# Patient Record
Sex: Female | Born: 1985 | Race: Black or African American | Hispanic: No | Marital: Single | Smoking: Never smoker
Health system: Southern US, Community
[De-identification: ages and names within clinical notes are randomized; demographics above are authoritative.]

## PROBLEM LIST (undated history)

## (undated) DIAGNOSIS — R569 Unspecified convulsions: Secondary | ICD-10-CM

## (undated) DIAGNOSIS — N83209 Unspecified ovarian cyst, unspecified side: Secondary | ICD-10-CM

## (undated) DIAGNOSIS — J45909 Unspecified asthma, uncomplicated: Secondary | ICD-10-CM

## (undated) DIAGNOSIS — G932 Benign intracranial hypertension: Secondary | ICD-10-CM

## (undated) DIAGNOSIS — G43909 Migraine, unspecified, not intractable, without status migrainosus: Secondary | ICD-10-CM

## (undated) DIAGNOSIS — F32A Depression, unspecified: Secondary | ICD-10-CM

## (undated) DIAGNOSIS — H538 Other visual disturbances: Secondary | ICD-10-CM

## (undated) DIAGNOSIS — R42 Dizziness and giddiness: Secondary | ICD-10-CM

## (undated) DIAGNOSIS — F329 Major depressive disorder, single episode, unspecified: Secondary | ICD-10-CM

## (undated) DIAGNOSIS — E669 Obesity, unspecified: Secondary | ICD-10-CM

## (undated) HISTORY — DX: Depression, unspecified: F32.A

## (undated) HISTORY — PX: ABDOMINAL HYSTERECTOMY: SHX81

## (undated) HISTORY — DX: Other visual disturbances: H53.8

## (undated) HISTORY — PX: OTHER SURGICAL HISTORY: SHX169

## (undated) HISTORY — DX: Migraine, unspecified, not intractable, without status migrainosus: G43.909

## (undated) HISTORY — DX: Major depressive disorder, single episode, unspecified: F32.9

---

## 2016-02-14 ENCOUNTER — Emergency Department (HOSPITAL_COMMUNITY)
Admission: EM | Admit: 2016-02-14 | Discharge: 2016-02-14 | Disposition: A | Discharge status: Home / Self Care | Payer: Medicare Other | Attending: Emergency Medicine | Admitting: Emergency Medicine

## 2016-02-14 ENCOUNTER — Encounter (HOSPITAL_COMMUNITY): Payer: Self-pay | Admitting: Emergency Medicine

## 2016-02-14 DIAGNOSIS — R110 Nausea: Secondary | ICD-10-CM | POA: Insufficient documentation

## 2016-02-14 DIAGNOSIS — J029 Acute pharyngitis, unspecified: Secondary | ICD-10-CM | POA: Diagnosis not present

## 2016-02-14 DIAGNOSIS — J45909 Unspecified asthma, uncomplicated: Secondary | ICD-10-CM | POA: Insufficient documentation

## 2016-02-14 DIAGNOSIS — R05 Cough: Secondary | ICD-10-CM | POA: Insufficient documentation

## 2016-02-14 DIAGNOSIS — R0982 Postnasal drip: Secondary | ICD-10-CM | POA: Insufficient documentation

## 2016-02-14 DIAGNOSIS — R300 Dysuria: Secondary | ICD-10-CM | POA: Insufficient documentation

## 2016-02-14 DIAGNOSIS — J9801 Acute bronchospasm: Secondary | ICD-10-CM

## 2016-02-14 HISTORY — DX: Unspecified asthma, uncomplicated: J45.909

## 2016-02-14 LAB — URINALYSIS, ROUTINE W REFLEX MICROSCOPIC
Bilirubin Urine: NEGATIVE
GLUCOSE, UA: NEGATIVE mg/dL
Hgb urine dipstick: NEGATIVE
Ketones, ur: NEGATIVE mg/dL
LEUKOCYTES UA: NEGATIVE
NITRITE: NEGATIVE
Protein, ur: NEGATIVE mg/dL
Specific Gravity, Urine: 1.031 — ABNORMAL HIGH (ref 1.005–1.030)
pH: 6 (ref 5.0–8.0)

## 2016-02-14 MED ORDER — LORATADINE 10 MG PO TABS: 10.0000 mg | ORAL_TABLET | Freq: Every day | ORAL | Status: DC

## 2016-02-14 MED ORDER — ALBUTEROL SULFATE HFA 108 (90 BASE) MCG/ACT IN AERS
2.0000 | INHALATION_SPRAY | Freq: Once | RESPIRATORY_TRACT | Status: AC
  Administered 2016-02-14: 2 via RESPIRATORY_TRACT
  Filled 2016-02-14: qty 6.7

## 2016-02-14 MED ORDER — ALBUTEROL SULFATE (2.5 MG/3ML) 0.083% IN NEBU
2.5000 mg | INHALATION_SOLUTION | Freq: Once | RESPIRATORY_TRACT | Status: AC
  Administered 2016-02-14: 2.5 mg via RESPIRATORY_TRACT
  Filled 2016-02-14: qty 3

## 2016-02-14 MED ORDER — PREDNISONE 20 MG PO TABS
60.0000 mg | ORAL_TABLET | Freq: Once | ORAL | Status: AC
  Administered 2016-02-14: 60 mg via ORAL
  Filled 2016-02-14: qty 3

## 2016-02-14 MED ORDER — PREDNISONE 20 MG PO TABS: 40.0000 mg | ORAL_TABLET | Freq: Every day | ORAL | Status: DC

## 2016-02-14 MED ORDER — BENZONATATE 100 MG PO CAPS: 100.0000 mg | ORAL_CAPSULE | Freq: Three times a day (TID) | ORAL | Status: DC | PRN

## 2016-02-14 MED ORDER — IPRATROPIUM-ALBUTEROL 0.5-2.5 (3) MG/3ML IN SOLN
3.0000 mL | Freq: Once | RESPIRATORY_TRACT | Status: AC
  Administered 2016-02-14: 3 mL via RESPIRATORY_TRACT
  Filled 2016-02-14: qty 3

## 2016-02-14 NOTE — ED Notes (Signed)
Pt c/o sore throat, cough x 3 days with dysuria.

## 2016-02-14 NOTE — Discharge Instructions (Signed)
Bronchospasm, Adult A bronchospasm is a spasm or tightening of the airways going into the lungs. During a bronchospasm breathing becomes more difficult because the airways get smaller. When this happens there can be coughing, a whistling sound when breathing (wheezing), and difficulty breathing. Bronchospasm is often associated with asthma, but not all patients who experience a bronchospasm have asthma. CAUSES  A bronchospasm is caused by inflammation or irritation of the airways. The inflammation or irritation may be triggered by:   Allergies (such as to animals, pollen, food, or mold). Allergens that cause bronchospasm may cause wheezing immediately after exposure or many hours later.   Infection. Viral infections are believed to be the most common cause of bronchospasm.   Exercise.   Irritants (such as pollution, cigarette smoke, strong odors, aerosol sprays, and paint fumes).   Weather changes. Winds increase molds and pollens in the air. Rain refreshes the air by washing irritants out. Cold air may cause inflammation.   Stress and emotional upset.  SIGNS AND SYMPTOMS   Wheezing.   Excessive nighttime coughing.   Frequent or severe coughing with a simple cold.   Chest tightness.   Shortness of breath.  DIAGNOSIS  Bronchospasm is usually diagnosed through a history and physical exam. Tests, such as chest X-rays, are sometimes done to look for other conditions. TREATMENT   Inhaled medicines can be given to open up your airways and help you breathe. The medicines can be given using either an inhaler or a nebulizer machine.  Corticosteroid medicines may be given for severe bronchospasm, usually when it is associated with asthma. HOME CARE INSTRUCTIONS   Always have a plan prepared for seeking medical care. Know when to call your health care provider and local emergency services (911 in the [place] Know where you can access local emergency care.  Only take medicines as  directed by your health care provider.  If you were prescribed an inhaler or nebulizer machine, ask your health care provider to explain how to use it correctly. Always use a spacer with your inhaler if you were given one.  It is necessary to remain calm during an attack. Try to relax and breathe more slowly.  Control your home environment in the following ways:   Change your heating and air conditioning filter at least once a month.   Limit your use of fireplaces and wood stoves.  Do not smoke and do not allow smoking in your home.   Avoid exposure to perfumes and fragrances.   Get rid of pests (such as roaches and mice) and their droppings.   Throw away plants if you see mold on them.   Keep your house clean and dust free.   Replace carpet with wood, tile, or vinyl flooring. Carpet can trap dander and dust.   Use allergy-proof pillows, mattress covers, and box spring covers.   Wash bed sheets and blankets every week in hot water and dry them in a dryer.   Use blankets that are made of polyester or cotton.   Wash hands frequently. SEEK MEDICAL CARE IF:   You have muscle aches.   You have chest pain.   The sputum changes from clear or white to yellow, green, gray, or bloody.   The sputum you cough up gets thicker.   There are problems that may be related to the medicine you are given, such as a rash, itching, swelling, or trouble breathing.  SEEK IMMEDIATE MEDICAL CARE IF:   You have worsening wheezing and coughing  even after taking your prescribed medicines.   °· You have increased difficulty breathing.   °· You develop severe chest pain. °MAKE SURE YOU:  °· Understand these instructions. °· Will watch your condition. °· Will get help right away if you are not doing well or get worse. °  °This information is not intended to replace advice given to you by your health care provider. Make sure you discuss any questions you have with your health care  provider. °  °Document Released: 11/15/2003 Document Revised: 12/03/2014 Document Reviewed: 05/04/2013 °Elsevier Interactive Patient Education ©2016 Elsevier Inc. ° °Cough, Adult °Coughing is a reflex that clears your throat and your airways. Coughing helps to heal and protect your lungs. It is normal to cough occasionally, but a cough that happens with other symptoms or lasts a long time may be a sign of a condition that needs treatment. A cough may last only 2-3 weeks (acute), or it may last longer than 8 weeks (chronic). °CAUSES °Coughing is commonly caused by: °· Breathing in substances that irritate your lungs. °· A viral or bacterial respiratory infection. °· Allergies. °· Asthma. °· Postnasal drip. °· Smoking. °· Acid backing up from the stomach into the esophagus (gastroesophageal reflux). °· Certain medicines. °· Chronic lung problems, including COPD (or rarely, lung cancer). °· Other medical conditions such as heart failure. °HOME CARE INSTRUCTIONS  °Pay attention to any changes in your symptoms. Take these actions to help with your discomfort: °· Take medicines only as told by your health care provider. °¨ If you were prescribed an antibiotic medicine, take it as told by your health care provider. Do not stop taking the antibiotic even if you start to feel better. °¨ Talk with your health care provider before you take a cough suppressant medicine. °· Drink enough fluid to keep your urine clear or pale yellow. °· If the air is dry, use a cold steam vaporizer or humidifier in your bedroom or your home to help loosen secretions. °· Avoid anything that causes you to cough at work or at home. °· If your cough is worse at night, try sleeping in a semi-upright position. °· Avoid cigarette smoke. If you smoke, quit smoking. If you need help quitting, ask your health care provider. °· Avoid caffeine. °· Avoid alcohol. °· Rest as needed. °SEEK MEDICAL CARE IF:  °· You have new symptoms. °· You cough up pus. °· Your  cough does not get better after 2-3 weeks, or your cough gets worse. °· You cannot control your cough with suppressant medicines and you are losing sleep. °· You develop pain that is getting worse or pain that is not controlled with pain medicines. °· You have a fever. °· You have unexplained weight loss. °· You have night sweats. °SEEK IMMEDIATE MEDICAL CARE IF: °· You cough up blood. °· You have difficulty breathing. °· Your heartbeat is very fast. °  °This information is not intended to replace advice given to you by your health care provider. Make sure you discuss any questions you have with your health care provider. °  °Document Released: 05/11/2011 Document Revised: 08/03/2015 Document Reviewed: 01/19/2015 °Elsevier Interactive Patient Education ©2016 Elsevier Inc. ° °
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
## 2016-04-18 ENCOUNTER — Encounter (HOSPITAL_COMMUNITY): Payer: Self-pay | Admitting: Emergency Medicine

## 2016-04-18 ENCOUNTER — Emergency Department (HOSPITAL_COMMUNITY)
Admission: EM | Admit: 2016-04-18 | Discharge: 2016-04-18 | Disposition: A | Discharge status: Home / Self Care | Payer: Medicare Other | Attending: Emergency Medicine | Admitting: Emergency Medicine

## 2016-04-18 ENCOUNTER — Emergency Department (HOSPITAL_COMMUNITY): Payer: Medicare Other

## 2016-04-18 DIAGNOSIS — Z79899 Other long term (current) drug therapy: Secondary | ICD-10-CM | POA: Diagnosis not present

## 2016-04-18 DIAGNOSIS — R102 Pelvic and perineal pain: Secondary | ICD-10-CM

## 2016-04-18 DIAGNOSIS — N83202 Unspecified ovarian cyst, left side: Secondary | ICD-10-CM | POA: Diagnosis not present

## 2016-04-18 DIAGNOSIS — N760 Acute vaginitis: Secondary | ICD-10-CM | POA: Diagnosis not present

## 2016-04-18 DIAGNOSIS — R1032 Left lower quadrant pain: Secondary | ICD-10-CM | POA: Diagnosis present

## 2016-04-18 DIAGNOSIS — Z792 Long term (current) use of antibiotics: Secondary | ICD-10-CM | POA: Insufficient documentation

## 2016-04-18 DIAGNOSIS — B9689 Other specified bacterial agents as the cause of diseases classified elsewhere: Secondary | ICD-10-CM | POA: Diagnosis not present

## 2016-04-18 DIAGNOSIS — J45909 Unspecified asthma, uncomplicated: Secondary | ICD-10-CM | POA: Diagnosis not present

## 2016-04-18 DIAGNOSIS — N83201 Unspecified ovarian cyst, right side: Secondary | ICD-10-CM | POA: Diagnosis not present

## 2016-04-18 DIAGNOSIS — R109 Unspecified abdominal pain: Secondary | ICD-10-CM

## 2016-04-18 HISTORY — DX: Benign intracranial hypertension: G93.2

## 2016-04-18 LAB — URINALYSIS, ROUTINE W REFLEX MICROSCOPIC
Glucose, UA: NEGATIVE mg/dL
Hgb urine dipstick: NEGATIVE
Ketones, ur: NEGATIVE mg/dL
Leukocytes, UA: NEGATIVE
Nitrite: NEGATIVE
Protein, ur: NEGATIVE mg/dL
Specific Gravity, Urine: 1.031 — ABNORMAL HIGH (ref 1.005–1.030)
pH: 5.5 (ref 5.0–8.0)

## 2016-04-18 LAB — WET PREP, GENITAL
SPERM: NONE SEEN
Trich, Wet Prep: NONE SEEN
WBC WET PREP: NONE SEEN
YEAST WET PREP: NONE SEEN

## 2016-04-18 LAB — CBC WITH DIFFERENTIAL/PLATELET
Basophils Absolute: 0 10*3/uL (ref 0.0–0.1)
Basophils Relative: 0 %
Eosinophils Absolute: 0.1 10*3/uL (ref 0.0–0.7)
Eosinophils Relative: 1 %
HCT: 38.8 % (ref 36.0–46.0)
Hemoglobin: 12.7 g/dL (ref 12.0–15.0)
Lymphocytes Relative: 22 %
Lymphs Abs: 1.8 10*3/uL (ref 0.7–4.0)
MCH: 28 pg (ref 26.0–34.0)
MCHC: 32.7 g/dL (ref 30.0–36.0)
MCV: 85.7 fL (ref 78.0–100.0)
Monocytes Absolute: 0.3 10*3/uL (ref 0.1–1.0)
Monocytes Relative: 4 %
Neutro Abs: 5.8 10*3/uL (ref 1.7–7.7)
Neutrophils Relative %: 73 %
Platelets: 368 10*3/uL (ref 150–400)
RBC: 4.53 MIL/uL (ref 3.87–5.11)
RDW: 14 % (ref 11.5–15.5)
WBC: 8.1 10*3/uL (ref 4.0–10.5)

## 2016-04-18 LAB — BASIC METABOLIC PANEL
Anion gap: 8 (ref 5–15)
BUN: 11 mg/dL (ref 6–20)
CO2: 23 mmol/L (ref 22–32)
Calcium: 8.9 mg/dL (ref 8.9–10.3)
Chloride: 108 mmol/L (ref 101–111)
Creatinine, Ser: 0.85 mg/dL (ref 0.44–1.00)
GFR calc Af Amer: >60 mL/min (ref >60)
GFR calc non Af Amer: >60 mL/min (ref >60)
Glucose, Bld: 93 mg/dL (ref 65–99)
Potassium: 3.9 mmol/L (ref 3.5–5.1)
Sodium: 139 mmol/L (ref 135–145)

## 2016-04-18 MED ORDER — ONDANSETRON HCL 4 MG/2ML IJ SOLN
4.0000 mg | Freq: Once | INTRAMUSCULAR | Status: AC
  Administered 2016-04-18: 4 mg via INTRAVENOUS
  Filled 2016-04-18: qty 2

## 2016-04-18 MED ORDER — METRONIDAZOLE 500 MG PO TABS: 500.0000 mg | ORAL_TABLET | Freq: Two times a day (BID) | ORAL | Status: DC

## 2016-04-18 MED ORDER — MORPHINE SULFATE (PF) 4 MG/ML IV SOLN
4.0000 mg | Freq: Once | INTRAVENOUS | Status: AC
  Administered 2016-04-18: 4 mg via INTRAVENOUS
  Filled 2016-04-18: qty 1

## 2016-04-18 MED ORDER — NAPROXEN 500 MG PO TABS: 500.0000 mg | ORAL_TABLET | Freq: Two times a day (BID) | ORAL | Status: DC

## 2016-04-18 MED ORDER — FLUCONAZOLE 150 MG PO TABS: 150.0000 mg | ORAL_TABLET | Freq: Every day | ORAL | Status: DC

## 2016-04-18 NOTE — Discharge Instructions (Signed)
The [facility] women outpatient clinic will call you to follow up with them regarding your visit to the emergency department today. If you do not hear from them within 1 week call them to schedule an appointment.  Return to the emergency department if you experience worsening abdominal pain, nausea, vomiting, fever, chills.  Bacterial Vaginosis Bacterial vaginosis is a vaginal infection that occurs when the normal balance of bacteria in the vagina is disrupted. It results from an overgrowth of certain bacteria. This is the most common vaginal infection in women of childbearing age. Treatment is important to prevent complications, especially in pregnant women, as it can cause a premature delivery. CAUSES  Bacterial vaginosis is caused by an increase in harmful bacteria that are normally present in smaller amounts in the vagina. Several different kinds of bacteria can cause bacterial vaginosis. However, the reason that the condition develops is not fully understood. RISK FACTORS Certain activities or behaviors can put you at an increased risk of developing bacterial vaginosis, including:  Having a new sex partner or multiple sex partners.  Douching.  Using an intrauterine device (IUD) for contraception. Women do not get bacterial vaginosis from toilet seats, bedding, swimming pools, or contact with objects around them. SIGNS AND SYMPTOMS  Some women with bacterial vaginosis have no signs or symptoms. Common symptoms include:  Grey vaginal discharge.  A fishlike odor with discharge, especially after sexual intercourse.  Itching or burning of the vagina and vulva.  Burning or pain with urination. DIAGNOSIS  Your health care provider will take a medical history and examine the vagina for signs of bacterial vaginosis. A sample of vaginal fluid may be taken. Your health care provider will look at this sample under a microscope to check for bacteria and abnormal cells. A vaginal pH test may also  be done.  TREATMENT  Bacterial vaginosis may be treated with antibiotic medicines. These may be given in the form of a pill or a vaginal cream. A second round of antibiotics may be prescribed if the condition comes back after treatment. Because bacterial vaginosis increases your risk for sexually transmitted diseases, getting treated can help reduce your risk for chlamydia, gonorrhea, HIV, and herpes. HOME CARE INSTRUCTIONS   Only take over-the-counter or prescription medicines as directed by your health care provider.  If antibiotic medicine was prescribed, take it as directed. Make sure you finish it even if you start to feel better.  Tell all sexual partners that you have a vaginal infection. They should see their health care provider and be treated if they have problems, such as a mild rash or itching.  During treatment, it is important that you follow these instructions:  Avoid sexual activity or use condoms correctly.  Do not douche.  Avoid alcohol as directed by your health care provider.  Avoid breastfeeding as directed by your health care provider. SEEK MEDICAL CARE IF:   Your symptoms are not improving after 3 days of treatment.  You have increased discharge or pain.  You have a fever. MAKE SURE YOU:   Understand these instructions.  Will watch your condition.  Will get help right away if you are not doing well or get worse. FOR MORE INFORMATION  Centers for Disease Control and Prevention, Division of STD Prevention: [employer] American Sexual Health Association (ASHA): www.ashastd.org    This information is not intended to replace advice given to you by your health care provider. Make sure you discuss any questions you have with your health care provider.  Document Released: 11/12/2005 Document Revised: 12/03/2014 Document Reviewed: 06/24/2013 Elsevier Interactive Patient Education [employer]2016 Elsevier Inc.

## 2016-04-18 NOTE — ED Notes (Signed)
Pt wheeled to waiting area after discharge.

## 2016-04-18 NOTE — ED Notes (Signed)
Patient ambulatory to restroom  ?

## 2016-04-18 NOTE — ED Notes (Signed)
ED PA at bedside

## 2016-04-18 NOTE — ED Notes (Signed)
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

## 2016-04-18 NOTE — ED Notes (Addendum)
Writer had two unsuccessful attempt for blood draws. RN made aware.

## 2016-04-18 NOTE — ED Notes (Signed)
PA at bedside.

## 2016-04-18 NOTE — ED Notes (Signed)
Attempted blood draw, This RN unsuccessful.  Will have another RN attempt.

## 2016-04-18 NOTE — ED Notes (Signed)
Patient transported to Ultrasound 

## 2016-04-18 NOTE — ED Notes (Signed)
In ultrasound

## 2016-04-18 NOTE — ED Provider Notes (Signed)
CSN: [id number]     Arrival date & time 04/18/16  0435 History   First MD Initiated Contact with Patient 04/18/16 [phone]     Chief Complaint  Patient presents with  . Flank Pain     (Consider location/radiation/quality/duration/timing/severity/associated sxs/prior Treatment) HPI   Patient is a 30 year old female with history of asthma and pseudotumor cerebri who presents the ED with 3 days of burning urination with associated flank pain. Patient states she's had recurrent BV on and off for 4 months. Patient states one week ago she had a yeast infection and treated it with over-the-counter medication. She states the flank pain as bilateral, constant, achy, worse with movement. She isn't taking anything for the pain. She also has left lower quadrant pain when she urinates. Associated fatigue, nausea, mild dull achy headache, and anorexia. She denies fever, chills, vomiting, diarrhea, constipation, chest pain, shortness of breath, no hematochezia and no changes in bowel habits or form. Patient states she had HIV and syphilis testing just a few days prior.  Past Medical History  Diagnosis Date  . Asthma   . Pseudotumor cerebri    Past Surgical History  Procedure Laterality Date  . Abdominal hysterectomy    . Cesarean section    . Carpel  tunnel release     Family History  Problem Relation Age of Onset  . Hypertension Other   . Diabetes Other   . Cancer Other   . CAD Other   . Leukemia Other    Social History  Substance Use Topics  . Smoking status: Never Smoker   . Smokeless tobacco: None  . Alcohol Use: No   OB History    No data available     Review of Systems  Constitutional: Positive for appetite change and fatigue. Negative for fever and chills.  HENT: Negative for trouble swallowing.   Eyes: Negative for visual disturbance.  Respiratory: Negative for chest tightness and shortness of breath.   Cardiovascular: Negative for chest pain.  Gastrointestinal: Positive for  nausea and abdominal pain. Negative for vomiting, diarrhea, constipation and blood in stool.  Genitourinary: Positive for dysuria. Negative for hematuria, vaginal bleeding and vaginal discharge.  Musculoskeletal: Positive for back pain. Negative for myalgias, arthralgias and neck pain.  Skin: Negative for rash.  Neurological: Positive for light-headedness and headaches. Negative for syncope, weakness and numbness.  Psychiatric/Behavioral: Negative for confusion.      Allergies  Review of patient's allergies indicates no known allergies.  Home Medications   Prior to Admission medications   Medication Sig Start Date End Date Taking? Authorizing Provider  albuterol (PROVENTIL HFA;VENTOLIN HFA) 108 (90 Base) MCG/ACT inhaler Inhale 2 puffs into the lungs every 6 (six) hours as needed for wheezing or shortness of breath.   Yes Historical Provider, MD  benzonatate (TESSALON) 100 MG capsule Take 1 capsule (100 mg total) by mouth 3 (three) times daily as needed for cough. Patient not taking: Reported on 04/18/2016 02/14/16   Antony Madura, PA-C  fluconazole (DIFLUCAN) 150 MG tablet Take 1 tablet (150 mg total) by mouth daily. 04/18/16   Jerre Simon, PA  loratadine (CLARITIN) 10 MG tablet Take 1 tablet (10 mg total) by mouth daily. Patient not taking: Reported on 04/18/2016 02/14/16   Antony Madura, PA-C  metroNIDAZOLE (FLAGYL) 500 MG tablet Take 1 tablet (500 mg total) by mouth 2 (two) times daily. 04/18/16   Jerre Simon, PA  naproxen (NAPROSYN) 500 MG tablet Take 1 tablet (500 mg total) by mouth 2 (  two) times daily. 04/18/16   Jerre SimonJessica L Klaryssa Fauth, PA  predniSONE (DELTASONE) 20 MG tablet Take 2 tablets (40 mg total) by mouth daily. Patient not taking: Reported on 04/18/2016 02/14/16   Antony MaduraKelly Humes, PA-C   BP 138/86 mmHg  Pulse 77  Temp(Src) 98 F (36.7 C) (Oral)  Resp 17  Ht 5\' 6"  (1.676 m)  Wt 141.341 kg  BMI 50.32 kg/m2  SpO2 99% Physical Exam  Constitutional: She appears well-developed and  well-nourished. No distress.  HENT:  Head: Normocephalic and atraumatic.  Eyes: Conjunctivae are normal.  Neck: Normal range of motion.  Cardiovascular: Normal rate, regular rhythm and normal heart sounds.   Pulmonary/Chest: Effort normal and breath sounds normal. No respiratory distress. She has no wheezes. She has no rales.  Abdominal: Soft. Normal appearance and bowel sounds are normal. She exhibits no distension.  Mild TTP to the LUQ and LLQ. CVA tenderness left > right. No rebound tenderness, no guarding.  Genitourinary:  Exam performed by Jerre SimonJessica L Janari Yamada,  exam chaperoned Date: 04/18/2016 Pelvic exam: normal external genitalia without evidence of trauma. VULVA: normal appearing vulva with no masses, tenderness or lesion. VAGINA: normal appearing vagina with normal color and discharge, no lesions. CERVIX:not visualized, cervical motion tenderness absent; vaginal discharge - creamy, moderate. Wet prep and DNA probe for chlamydia and GC obtained.   ADNEXA: non-palpable due to body habitus. Pt TTP of the left adnexal region  UTERUS: not present, hx of hysterectomy.    Neurological: She is alert. Coordination normal.  Skin: Skin is warm and dry. No rash noted. She is not diaphoretic.  Psychiatric: She has a normal mood and affect. Her behavior is normal.    ED Course  Procedures (including critical care time) Labs Review Labs Reviewed  WET PREP, GENITAL - Abnormal; Notable for the following:    Clue Cells Wet Prep HPF POC PRESENT (*)    All other components within normal limits  URINALYSIS, ROUTINE W REFLEX MICROSCOPIC (NOT AT [facility]) - Abnormal; Notable for the following:    APPearance CLOUDY (*)    Specific Gravity, Urine 1.031 (*)    Bilirubin Urine SMALL (*)    All other components within normal limits  URINE CULTURE  CBC WITH DIFFERENTIAL/PLATELET  BASIC METABOLIC PANEL  GC/CHLAMYDIA PROBE AMP ([place]) NOT AT [facility]    Imaging Review [place] Transvaginal  Non-ob  04/18/2016  ADDENDUM REPORT: 04/18/2016 09:54 ADDENDUM: Correction, the recommended follow-up imaging and impression #2 should read: "Pelvis MRI without and with IV contrast (Gyn pelvis protocol)." Study discussed by telephone with PA Anabia Weatherwax on 04/18/2016 at 0950 hours. Electronically Signed   By: Odessa FlemingH  Hall M.D.   On: 04/18/2016 09:54  04/18/2016  CLINICAL DATA:  30 year old female with left greater than right pelvic pain for 3 days. Initial encounter. Personal history of hysterectomy. EXAM: TRANSABDOMINAL AND TRANSVAGINAL ULTRASOUND OF PELVIS TECHNIQUE: Both transabdominal and transvaginal ultrasound examinations of the pelvis were performed. Transabdominal technique was performed for global imaging of the pelvis including uterus, ovaries, adnexal regions, and pelvic cul-de-sac. It was necessary to proceed with endovaginal exam following the transabdominal exam to visualize the ovaries. COMPARISON:  None FINDINGS: Uterus Measurements: Surgically absent. Endometrium Thickness: Surgically absent. Right ovary Measurements: 10.8 x 7.6 x 7.8 cm. Large bilobed/septated versus adjacent adnexal cysts encompassing 5.1-7.6 cm individually. Intervening parenchyma or septation with vascularity (image 60). No other internal architecture. Left ovary Measurements: 3.4 x 3.4 x 2.5 cm. There is a 4.1 cm hypoechoic area with homogeneous low  level internal echoes (image 92). No definite associated vascularity. Other findings Trace simple appearing free fluid. IMPRESSION: 1. Surgically absent uterus. 2. Abnormalities of both ovaries. Overall followup pelvis ultrasound without and with IV contrast (gyn pelvis protocol) to characterize further may be most valuable at this point. On the right cystic lesions individually up to 7.6 cm with indeterminate but probably benign characteristics are noted. While on the left there is a 4.1 cm hypoechoic area which most resembles an endometrioma. 3. Trace simple appearing pelvic free  fluid. Electronically Signed: By: Odessa Fleming M.D. On: 04/18/2016 09:25   US Pelvis Complete  04/18/2016  ADDENDUM REPORT: 04/18/2016 09:54 ADDENDUM: Correction, the recommended follow-up imaging and impression #2 should read: "Pelvis MRI without and with IV contrast (Gyn pelvis protocol)." Study discussed by telephone with PA Walta [place] on 04/18/2016 at 0950 hours. Electronically Signed   By: Odessa Fleming M.D.   On: 04/18/2016 09:54  04/18/2016  CLINICAL DATA:  30 year old female with left greater than right pelvic pain for 3 days. Initial encounter. Personal history of hysterectomy. EXAM: TRANSABDOMINAL AND TRANSVAGINAL ULTRASOUND OF PELVIS TECHNIQUE: Both transabdominal and transvaginal ultrasound examinations of the pelvis were performed. Transabdominal technique was performed for global imaging of the pelvis including uterus, ovaries, adnexal regions, and pelvic cul-de-sac. It was necessary to proceed with endovaginal exam following the transabdominal exam to visualize the ovaries. COMPARISON:  None FINDINGS: Uterus Measurements: Surgically absent. Endometrium Thickness: Surgically absent. Right ovary Measurements: 10.8 x 7.6 x 7.8 cm. Large bilobed/septated versus adjacent adnexal cysts encompassing 5.1-7.6 cm individually. Intervening parenchyma or septation with vascularity (image 60). No other internal architecture. Left ovary Measurements: 3.4 x 3.4 x 2.5 cm. There is a 4.1 cm hypoechoic area with homogeneous low level internal echoes (image 92). No definite associated vascularity. Other findings Trace simple appearing free fluid. IMPRESSION: 1. Surgically absent uterus. 2. Abnormalities of both ovaries. Overall followup pelvis ultrasound without and with IV contrast (gyn pelvis protocol) to characterize further may be most valuable at this point. On the right cystic lesions individually up to 7.6 cm with indeterminate but probably benign characteristics are noted. While on the left there is a 4.1 cm  hypoechoic area which most resembles an endometrioma. 3. Trace simple appearing pelvic free fluid. Electronically Signed: By: Odessa Fleming M.D. On: 04/18/2016 09:25   I have personally reviewed and evaluated these images and lab results as part of my medical decision-making.   EKG Interpretation None      MDM   Final diagnoses:  Abdominal pain, unspecified abdominal location  BV (bacterial vaginosis)  Bilateral ovarian cysts    Patient with abdominal pain and bilateral CVA tenderness. Patient's urinalysis and labs were unremarkable. Wet prep revealed clue cells. Pelvic exam revealed left-sided adnexal tenderness. Due to tenderness ordered ultrasound.   Ultrasound revealed cystic lesion on the right ovary 7.6 centimeters and a 4.1 cm hypoechoic area on the left ovary. No torsion seen on ultrasound. The radiologist suggested MRI to further evaluate these lesions. I consulted OB/GYN and spoke with Dr. Debroah Loop who suggested the patient follow-up at the [facility] health clinic and there was no need for emergent imaging at this time. He states the clinic will call the patient to set up an appointment for follow-up. Patient moved to the area sometime ago and does not have an OB/GYN here.  Patient's pain was well-controlled in the ED. Will discharge patient with Flagyl for BV and naproxen for her abdominal pain. Discussed strict return precautions with  the patient and to call the clinic if she does not hear from them within a week. She expressed understanding to the discharge instructions.      Jerre Simon, PA 04/18/16 1150  Shon Baton, MD 04/19/16 1535

## 2016-04-19 ENCOUNTER — Encounter (HOSPITAL_COMMUNITY): Payer: Self-pay

## 2016-04-19 ENCOUNTER — Emergency Department (HOSPITAL_COMMUNITY)
Admission: EM | Admit: 2016-04-19 | Discharge: 2016-04-19 | Disposition: A | Discharge status: Home / Self Care | Payer: Medicare Other | Attending: Emergency Medicine | Admitting: Emergency Medicine

## 2016-04-19 ENCOUNTER — Emergency Department (HOSPITAL_COMMUNITY): Payer: Medicare Other

## 2016-04-19 DIAGNOSIS — Z79899 Other long term (current) drug therapy: Secondary | ICD-10-CM | POA: Diagnosis not present

## 2016-04-19 DIAGNOSIS — N83202 Unspecified ovarian cyst, left side: Secondary | ICD-10-CM | POA: Insufficient documentation

## 2016-04-19 DIAGNOSIS — J45909 Unspecified asthma, uncomplicated: Secondary | ICD-10-CM | POA: Diagnosis not present

## 2016-04-19 DIAGNOSIS — N83201 Unspecified ovarian cyst, right side: Secondary | ICD-10-CM | POA: Insufficient documentation

## 2016-04-19 DIAGNOSIS — R1030 Lower abdominal pain, unspecified: Secondary | ICD-10-CM | POA: Diagnosis present

## 2016-04-19 DIAGNOSIS — R109 Unspecified abdominal pain: Secondary | ICD-10-CM

## 2016-04-19 LAB — COMPREHENSIVE METABOLIC PANEL
ALT: 13 U/L — ABNORMAL LOW (ref 14–54)
AST: 14 U/L — ABNORMAL LOW (ref 15–41)
Albumin: 4.4 g/dL (ref 3.5–5.0)
Alkaline Phosphatase: 69 U/L (ref 38–126)
Anion gap: 5 (ref 5–15)
BUN: 11 mg/dL (ref 6–20)
CALCIUM: 8.7 mg/dL — AB (ref 8.9–10.3)
CHLORIDE: 107 mmol/L (ref 101–111)
CO2: 27 mmol/L (ref 22–32)
CREATININE: 0.76 mg/dL (ref 0.44–1.00)
Glucose, Bld: 88 mg/dL (ref 65–99)
POTASSIUM: 4.1 mmol/L (ref 3.5–5.1)
Sodium: 139 mmol/L (ref 135–145)
TOTAL PROTEIN: 8.1 g/dL (ref 6.5–8.1)
Total Bilirubin: 0.4 mg/dL (ref 0.3–1.2)

## 2016-04-19 LAB — URINE CULTURE

## 2016-04-19 LAB — URINALYSIS, ROUTINE W REFLEX MICROSCOPIC
GLUCOSE, UA: NEGATIVE mg/dL
HGB URINE DIPSTICK: NEGATIVE
Ketones, ur: NEGATIVE mg/dL
Leukocytes, UA: NEGATIVE
Nitrite: NEGATIVE
PROTEIN: NEGATIVE mg/dL
Specific Gravity, Urine: 1.031 — ABNORMAL HIGH (ref 1.005–1.030)
pH: 6 (ref 5.0–8.0)

## 2016-04-19 LAB — CBC WITH DIFFERENTIAL/PLATELET
BASOS ABS: 0 10*3/uL (ref 0.0–0.1)
Basophils Relative: 0 %
EOS PCT: 2 %
Eosinophils Absolute: 0.2 10*3/uL (ref 0.0–0.7)
HCT: 39.5 % (ref 36.0–46.0)
Hemoglobin: 12.8 g/dL (ref 12.0–15.0)
LYMPHS PCT: 26 %
Lymphs Abs: 2.3 10*3/uL (ref 0.7–4.0)
MCH: 27.9 pg (ref 26.0–34.0)
MCHC: 32.4 g/dL (ref 30.0–36.0)
MCV: 86.1 fL (ref 78.0–100.0)
MONO ABS: 0.7 10*3/uL (ref 0.1–1.0)
Monocytes Relative: 7 %
Neutro Abs: 5.7 10*3/uL (ref 1.7–7.7)
Neutrophils Relative %: 65 %
Platelets: 385 10*3/uL (ref 150–400)
RBC: 4.59 MIL/uL (ref 3.87–5.11)
RDW: 14 % (ref 11.5–15.5)
WBC: 8.9 10*3/uL (ref 4.0–10.5)

## 2016-04-19 LAB — GC/CHLAMYDIA PROBE AMP (~~LOC~~) NOT AT ARMC
Chlamydia: NEGATIVE
NEISSERIA GONORRHEA: NEGATIVE

## 2016-04-19 LAB — LIPASE, BLOOD: LIPASE: 28 U/L (ref 11–51)

## 2016-04-19 MED ORDER — HYDROCODONE-ACETAMINOPHEN 5-325 MG PO TABS: 1.0000 | ORAL_TABLET | Freq: Four times a day (QID) | ORAL | Status: DC | PRN

## 2016-04-19 MED ORDER — NAPROXEN 500 MG PO TABS: 500.0000 mg | ORAL_TABLET | Freq: Two times a day (BID) | ORAL | Status: DC

## 2016-04-19 MED ORDER — METRONIDAZOLE 500 MG PO TABS: 500.0000 mg | ORAL_TABLET | Freq: Two times a day (BID) | ORAL | Status: DC

## 2016-04-19 MED ORDER — IOPAMIDOL (ISOVUE-300) INJECTION 61%
100.0000 mL | Freq: Once | INTRAVENOUS | Status: AC | PRN
  Administered 2016-04-19: 100 mL via INTRAVENOUS

## 2016-04-19 MED ORDER — FLUCONAZOLE 150 MG PO TABS: 150.0000 mg | ORAL_TABLET | Freq: Every day | ORAL | Status: AC
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
## 2016-04-19 NOTE — ED Notes (Signed)
attempted IV by RN, unsuccessful.

## 2016-04-19 NOTE — Discharge Instructions (Signed)
Take naprosyn for pain. norco for severe pain. Follow up with OB/GYN. Return if worsening.    Abdominal Pain, Adult Many things can cause abdominal pain. Usually, abdominal pain is not caused by a disease and will improve without treatment. It can often be observed and treated at home. Your health care provider will do a physical exam and possibly order blood tests and X-rays to help determine the seriousness of your pain. However, in many cases, more time must pass before a clear cause of the pain can be found. Before that point, your health care provider may not know if you need more testing or further treatment. HOME CARE INSTRUCTIONS Monitor your abdominal pain for any changes. The following actions may help to alleviate any discomfort you are experiencing:  Only take over-the-counter or prescription medicines as directed by your health care provider.  Do not take laxatives unless directed to do so by your health care provider.  Try a clear liquid diet (broth, tea, or water) as directed by your health care provider. Slowly move to a bland diet as tolerated. SEEK MEDICAL CARE IF:  You have unexplained abdominal pain.  You have abdominal pain associated with nausea or diarrhea.  You have pain when you urinate or have a bowel movement.  You experience abdominal pain that wakes you in the night.  You have abdominal pain that is worsened or improved by eating food.  You have abdominal pain that is worsened with eating fatty foods.  You have a fever. SEEK IMMEDIATE MEDICAL CARE IF:  Your pain does not go away within 2 hours.  You keep throwing up (vomiting).  Your pain is felt only in portions of the abdomen, such as the right side or the left lower portion of the abdomen.  You pass bloody or black tarry stools. MAKE SURE YOU:  Understand these instructions.  Will watch your condition.  Will get help right away if you are not doing well or get worse.   This information is  not intended to replace advice given to you by your health care provider. Make sure you discuss any questions you have with your health care provider.   Document Released: 08/22/2005 Document Revised: 08/03/2015 Document Reviewed: 07/22/2013 Elsevier Interactive Patient Education 2016 Elsevier Inc. Ovarian Cyst An ovarian cyst is a fluid-filled sac that forms on an ovary. The ovaries are small organs that produce eggs in women. Various types of cysts can form on the ovaries. Most are not cancerous. Many do not cause problems, and they often go away on their own. Some may cause symptoms and require treatment. Common types of ovarian cysts include:  Functional cysts--These cysts may occur every month during the menstrual cycle. This is normal. The cysts usually go away with the next menstrual cycle if the woman does not get pregnant. Usually, there are no symptoms with a functional cyst.  Endometrioma cysts--These cysts form from the tissue that lines the uterus. They are also called "chocolate cysts" because they become filled with blood that turns brown. This type of cyst can cause pain in the lower abdomen during intercourse and with your menstrual period.  Cystadenoma cysts--This type develops from the cells on the outside of the ovary. These cysts can get very big and cause lower abdomen pain and pain with intercourse. This type of cyst can twist on itself, cut off its blood supply, and cause severe pain. It can also easily rupture and cause a lot of pain.  Dermoid cysts--This type of  cyst is sometimes found in both ovaries. These cysts may contain different kinds of body tissue, such as skin, teeth, hair, or cartilage. They usually do not cause symptoms unless they get very big.  Theca lutein cysts--These cysts occur when too much of a certain hormone (human chorionic gonadotropin) is produced and overstimulates the ovaries to produce an egg. This is most common after procedures used to assist  with the conception of a baby (in vitro fertilization). CAUSES   Fertility drugs can cause a condition in which multiple large cysts are formed on the ovaries. This is called ovarian hyperstimulation syndrome.  A condition called polycystic ovary syndrome can cause hormonal imbalances that can lead to nonfunctional ovarian cysts. SIGNS AND SYMPTOMS  Many ovarian cysts do not cause symptoms. If symptoms are present, they may include:  Pelvic pain or pressure.  Pain in the lower abdomen.  Pain during sexual intercourse.  Increasing girth (swelling) of the abdomen.  Abnormal menstrual periods.  Increasing pain with menstrual periods.  Stopping having menstrual periods without being pregnant. DIAGNOSIS  These cysts are commonly found during a routine or annual pelvic exam. Tests may be ordered to find out more about the cyst. These tests may include:  Ultrasound.  X-ray of the pelvis.  CT scan.  MRI.  Blood tests. TREATMENT  Many ovarian cysts go away on their own without treatment. Your health care provider may want to check your cyst regularly for 2-3 months to see if it changes. For women in menopause, it is particularly important to monitor a cyst closely because of the higher rate of ovarian cancer in menopausal women. When treatment is needed, it may include any of the following:  A procedure to drain the cyst (aspiration). This may be done using a long needle and ultrasound. It can also be done through a laparoscopic procedure. This involves using a thin, lighted tube with a tiny camera on the end (laparoscope) inserted through a small incision.  Surgery to remove the whole cyst. This may be done using laparoscopic surgery or an open surgery involving a larger incision in the lower abdomen.  Hormone treatment or birth control pills. These methods are sometimes used to help dissolve a cyst. HOME CARE INSTRUCTIONS   Only take over-the-counter or prescription medicines as  directed by your health care provider.  Follow up with your health care provider as directed.  Get regular pelvic exams and Pap tests. SEEK MEDICAL CARE IF:   Your periods are late, irregular, or painful, or they stop.  Your pelvic pain or abdominal pain does not go away.  Your abdomen becomes larger or swollen.  You have pressure on your bladder or trouble emptying your bladder completely.  You have pain during sexual intercourse.  You have feelings of fullness, pressure, or discomfort in your stomach.  You lose weight for no apparent reason.  You feel generally ill.  You become constipated.  You lose your appetite.  You develop acne.  You have an increase in body and facial hair.  You are gaining weight, without changing your exercise and eating habits.  You think you are pregnant. SEEK IMMEDIATE MEDICAL CARE IF:   You have increasing abdominal pain.  You feel sick to your stomach (nauseous), and you throw up (vomit).  You develop a fever that comes on suddenly.  You have abdominal pain during a bowel movement.  Your menstrual periods become heavier than usual. MAKE SURE YOU:  Understand these instructions.  Will watch your  condition.  Will get help right away if you are not doing well or get worse.   This information is not intended to replace advice given to you by your health care provider. Make sure you discuss any questions you have with your health care provider.   Document Released: 11/12/2005 Document Revised: 11/17/2013 Document Reviewed: 07/20/2013 Elsevier Interactive Patient Education [employer]2016 Elsevier Inc.
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
## 2016-04-19 NOTE — ED Notes (Signed)
Made 2 unsuccessful attempts to draw labs. Nurse informed.

## 2016-06-07 ENCOUNTER — Emergency Department (HOSPITAL_COMMUNITY)
Admission: EM | Admit: 2016-06-07 | Discharge: 2016-06-08 | Disposition: A | Discharge status: Home / Self Care | Payer: Medicare Other | Attending: Emergency Medicine | Admitting: Emergency Medicine

## 2016-06-07 ENCOUNTER — Encounter (HOSPITAL_COMMUNITY): Payer: Self-pay

## 2016-06-07 DIAGNOSIS — Z79899 Other long term (current) drug therapy: Secondary | ICD-10-CM | POA: Insufficient documentation

## 2016-06-07 DIAGNOSIS — N83201 Unspecified ovarian cyst, right side: Secondary | ICD-10-CM | POA: Diagnosis not present

## 2016-06-07 DIAGNOSIS — J45909 Unspecified asthma, uncomplicated: Secondary | ICD-10-CM | POA: Insufficient documentation

## 2016-06-07 DIAGNOSIS — N898 Other specified noninflammatory disorders of vagina: Secondary | ICD-10-CM

## 2016-06-07 DIAGNOSIS — N83202 Unspecified ovarian cyst, left side: Secondary | ICD-10-CM | POA: Insufficient documentation

## 2016-06-07 NOTE — ED Notes (Signed)
Attempted lab draw x 2 but unsuccessful. 

## 2016-06-07 NOTE — ED Notes (Signed)
Pt has ovarian cysts and reoccurring BV, she has an appt next week but she's unable to control her pain right now

## 2016-06-08 DIAGNOSIS — N83202 Unspecified ovarian cyst, left side: Secondary | ICD-10-CM | POA: Diagnosis not present

## 2016-06-08 MED ORDER — OXYCODONE-ACETAMINOPHEN 5-325 MG PO TABS
1.0000 | ORAL_TABLET | Freq: Once | ORAL | Status: AC
Start: 2016-06-08 — End: 2016-06-08
  Administered 2016-06-08: 1 via ORAL
  Filled 2016-06-08: qty 1

## 2016-06-08 MED ORDER — METRONIDAZOLE 500 MG PO TABS: 500.0000 mg | ORAL_TABLET | Freq: Three times a day (TID) | ORAL | Status: DC

## 2016-06-08 MED ORDER — METRONIDAZOLE 500 MG PO TABS
500.0000 mg | ORAL_TABLET | Freq: Once | ORAL | Status: AC
  Administered 2016-06-08: 500 mg via ORAL
  Filled 2016-06-08: qty 1

## 2016-06-08 MED ORDER — OXYCODONE-ACETAMINOPHEN 5-325 MG PO TABS: 1.0000 | ORAL_TABLET | ORAL | Status: DC | PRN

## 2016-06-08 NOTE — Discharge Instructions (Signed)
Follow-up with your gynecologist next Tuesday as scheduled.

## 2016-06-08 NOTE — ED Provider Notes (Signed)
CSN: [id number]     Arrival date & time 06/07/16  1953 History   First MD Initiated Contact with Patient 06/07/16 2337     Chief Complaint  Patient presents with  . Ovarian Cyst     (Consider location/radiation/quality/duration/timing/severity/associated sxs/prior Treatment) HPI   Laurie Fields is a 30 y.o. female who presents for evaluation of recurrent pelvic pain, and vaginal discharge. Vaginal discharge is malodorous. She had STD testing done, [facility] days ago at health [facility]. She also had blood work done by her physician this week, his routine maintenance. She has known ovarian cyst, and plans to see a gynecologist. Last seen in the ED, May 2017, with plans to follow gynecology. Her appointment has finally been scheduled, for next week, Tuesday. She denies fever, chills, nausea, vomiting, cough, shortness of breath, chest pain, weakness or dizziness. There are no other known modifying factors.  Past Medical History  Diagnosis Date  . Asthma   . Pseudotumor cerebri    Past Surgical History  Procedure Laterality Date  . Abdominal hysterectomy    . Cesarean section    . Carpel  tunnel release     Family History  Problem Relation Age of Onset  . Hypertension Other   . Diabetes Other   . Cancer Other   . CAD Other   . Leukemia Other    Social History  Substance Use Topics  . Smoking status: Never Smoker   . Smokeless tobacco: None  . Alcohol Use: No   OB History    No data available     Review of Systems  All other systems reviewed and are negative.     Allergies  Review of patient's allergies indicates no known allergies.  Home Medications   Prior to Admission medications   Medication Sig Start Date End Date Taking? Authorizing Provider  albuterol (PROVENTIL HFA;VENTOLIN HFA) 108 (90 Base) MCG/ACT inhaler Inhale 2 puffs into the lungs every 6 (six) hours as needed for wheezing or shortness of breath.   Yes Historical Provider, MD  metroNIDAZOLE  (FLAGYL) 500 MG tablet Take 1 tablet (500 mg total) by mouth 3 (three) times daily. 06/08/16   Mancel Bale, MD  oxyCODONE-acetaminophen (PERCOCET) 5-325 MG tablet Take 1 tablet by mouth every 4 (four) hours as needed for severe pain. 06/08/16   Mancel Bale, MD   BP 121/79 mmHg  Pulse 81  Temp(Src) 98.7 F (37.1 C) (Oral)  Resp 20  SpO2 100% Physical Exam  Constitutional: She is oriented to person, place, and time. She appears well-developed. No distress.  Obese  HENT:  Head: Normocephalic and atraumatic.  Right Ear: External ear normal.  Left Ear: External ear normal.  Eyes: Conjunctivae and EOM are normal. Pupils are equal, round, and reactive to light.  Neck: Normal range of motion and phonation normal. Neck supple.  Cardiovascular: Normal rate.   Pulmonary/Chest: Effort normal. She exhibits no bony tenderness.  Musculoskeletal: Normal range of motion.  Neurological: She is alert and oriented to person, place, and time. No cranial nerve deficit or sensory deficit. She exhibits normal muscle tone. Coordination normal.  Skin: Skin is warm, dry and intact.  Psychiatric: She has a normal mood and affect. Her behavior is normal. Judgment and thought content normal.  Nursing note and vitals reviewed.   ED Course  Procedures (including critical care time) Medications  metroNIDAZOLE (FLAGYL) tablet 500 mg (not administered)  oxyCODONE-acetaminophen (PERCOCET/ROXICET) 5-325 MG per tablet 1 tablet (not administered)    Patient Vitals for the  past 24 hrs:  BP Temp Temp src Pulse Resp SpO2  06/07/16 2019 121/79 mmHg 98.7 F (37.1 C) Oral 81 20 100 %  06/07/16 2015 112/84 mmHg 98.2 F (36.8 C) Oral 96 19 98 %    12:14 AM Reevaluation with update and discussion. After initial assessment and treatment, an updated evaluation reveals Findings discussed with the patient, all questions answered. [name]     Labs Review Labs Reviewed - No data to display  Imaging Review No  results found. I have personally reviewed and evaluated these images and lab results as part of my medical decision-making.   EKG Interpretation None      MDM   Final diagnoses:  Cysts of both ovaries  Vaginal discharge    Recurrent pelvic pain, and vaginal discharge. Doubt sepsis, metabolic instability or ovarian torsion.  Nursing Notes Reviewed/ Care Coordinated Applicable Imaging Reviewed Interpretation of Laboratory Data incorporated into ED treatment  The patient appears reasonably screened and/or stabilized for discharge and I doubt any other medical condition or other [facility] requiring further screening, evaluation, or treatment in the ED at this time prior to discharge.  Plan: Home Medications- Flagyl, Percocet; Home Treatments- rest; return here if the recommended treatment, does not improve the symptoms; Recommended follow up- GYN follow-up, next week as scheduled     Mancel BaleElliott Nettye Flegal, MD 06/08/16 [id number]

## 2016-06-11 ENCOUNTER — Encounter: Payer: Medicare Other | Admitting: Obstetrics & Gynecology

## 2016-06-20 ENCOUNTER — Encounter (HOSPITAL_COMMUNITY): Payer: Self-pay | Admitting: Emergency Medicine

## 2016-06-20 ENCOUNTER — Ambulatory Visit (HOSPITAL_COMMUNITY)
Admission: EM | Admit: 2016-06-20 | Discharge: 2016-06-20 | Disposition: A | Discharge status: Home / Self Care | Payer: Medicare Other | Attending: Family Medicine | Admitting: Family Medicine

## 2016-06-20 DIAGNOSIS — Z79899 Other long term (current) drug therapy: Secondary | ICD-10-CM | POA: Insufficient documentation

## 2016-06-20 DIAGNOSIS — Z9889 Other specified postprocedural states: Secondary | ICD-10-CM | POA: Insufficient documentation

## 2016-06-20 DIAGNOSIS — J069 Acute upper respiratory infection, unspecified: Secondary | ICD-10-CM | POA: Diagnosis not present

## 2016-06-20 DIAGNOSIS — H6690 Otitis media, unspecified, unspecified ear: Secondary | ICD-10-CM | POA: Diagnosis not present

## 2016-06-20 DIAGNOSIS — R05 Cough: Secondary | ICD-10-CM | POA: Diagnosis present

## 2016-06-20 DIAGNOSIS — J029 Acute pharyngitis, unspecified: Secondary | ICD-10-CM | POA: Diagnosis present

## 2016-06-20 LAB — POCT RAPID STREP A: Streptococcus, Group A Screen (Direct): NEGATIVE

## 2016-06-20 MED ORDER — AMOXICILLIN 500 MG PO CAPS: 500.0000 mg | ORAL_CAPSULE | Freq: Three times a day (TID) | ORAL | 0 refills | Status: DC

## 2016-06-20 NOTE — ED Triage Notes (Signed)
The patient presented to the [facility] with a complaint of a sore throat with a cough and otalgia x 3 days.

## 2016-06-21 LAB — CULTURE, GROUP A STREP (THRC)

## 2016-06-21 NOTE — ED Provider Notes (Signed)
CSN: [id number]     Arrival date & time 06/20/16  1653 History   First MD Initiated Contact with Patient 06/20/16 1716     Chief Complaint  Patient presents with  . Sore Throat  . Cough   (Consider location/radiation/quality/duration/timing/severity/associated sxs/prior Treatment) HPI 30 year old female with onset of URI symptoms 2 days ago now with a right earache. Has been using over-the-counter medications without great relief of her symptoms. No associated fever at home. She has had mild wheezing by her report. Past Medical History:  Diagnosis Date  . Asthma   . Pseudotumor cerebri    Past Surgical History:  Procedure Laterality Date  . ABDOMINAL HYSTERECTOMY    . carpel  tunnel release    . CESAREAN SECTION     Family History  Problem Relation Age of Onset  . Hypertension Other   . Diabetes Other   . Cancer Other   . CAD Other   . Leukemia Other    Social History  Substance Use Topics  . Smoking status: Never Smoker  . Smokeless tobacco: Never Used  . Alcohol use No   OB History    No data available     Review of Systems  Denies: HEADACHE, NAUSEA, ABDOMINAL PAIN, CHEST PAIN, CONGESTION, DYSURIA, SHORTNESS OF BREATH  Allergies  Serevent [salmeterol]  Home Medications   Prior to Admission medications   Medication Sig Start Date End Date Taking? Authorizing Provider  albuterol (PROVENTIL HFA;VENTOLIN HFA) 108 (90 Base) MCG/ACT inhaler Inhale 2 puffs into the lungs every 6 (six) hours as needed for wheezing or shortness of breath.   Yes Historical Provider, MD  amoxicillin (AMOXIL) 500 MG capsule Take 1 capsule (500 mg total) by mouth 3 (three) times daily. 06/20/16   Tharon Aquas, PA  metroNIDAZOLE (FLAGYL) 500 MG tablet Take 1 tablet (500 mg total) by mouth 3 (three) times daily. 06/08/16   Mancel Bale, MD  oxyCODONE-acetaminophen (PERCOCET) 5-325 MG tablet Take 1 tablet by mouth every 4 (four) hours as needed for severe pain. 06/08/16   Mancel Bale, MD    Meds Ordered and Administered this Visit  Medications - No data to display  BP 137/82 (BP Location: Right Arm)   Pulse 82   Temp 98.2 F (36.8 C) (Oral)   Resp 18   Ht  (1.651 m)   Wt (!) 312 lb (141.5 kg)   SpO2 98%   BMI 51.92 kg/m  No data found.   Physical Exam NURSES NOTES AND VITAL SIGNS REVIEWED. CONSTITUTIONAL: Well developed, well nourished, no acute distress HEENT: normocephalic, atraumatic, left TM is red bulging with poor light reflex and no motion. EYES: Conjunctiva normal NECK:normal ROM, supple, no adenopathy PULMONARY:No respiratory distress, normal effort ABDOMINAL: Soft, ND, NT BS+, No CVAT MUSCULOSKELETAL: Normal ROM of all extremities,  SKIN: warm and dry without rash PSYCHIATRIC: Mood and affect, behavior are normal  Urgent Care Course   Clinical Course    Procedures (including critical care time)  Labs Review Labs Reviewed  CULTURE, GROUP A STREP [facility])  POCT RAPID STREP A    Imaging Review No results found.   Visual Acuity Review  Right Eye Distance:   Left Eye Distance:   Bilateral Distance:    Right Eye Near:   Left Eye Near:    Bilateral Near:       Prescription for amoxicillin is provided to the patient. Advised to follow-up with her primary care provider if there are new or worsening of symptoms.  MDM   1. URI (upper respiratory infection)   2. Otitis media, recurrence not specified, unspecified chronicity, unspecified laterality, unspecified otitis media type     Patient is reassured that there are no issues that require transfer to higher level of care at this time or additional tests. Patient is advised to continue home symptomatic treatment. Patient is advised that if there are new or worsening symptoms to attend the emergency department, contact primary care provider, or return to UC. Instructions of care provided discharged home in stable condition.    THIS NOTE WAS GENERATED USING A VOICE RECOGNITION  SOFTWARE PROGRAM. ALL REASONABLE EFFORTS  WERE MADE TO PROOFREAD THIS DOCUMENT FOR ACCURACY.  I have verbally reviewed the discharge instructions with the patient. A printed AVS was given to the patient.  All questions were answered prior to discharge.      Tharon Aquas, PA 06/21/16 1700

## 2016-06-22 ENCOUNTER — Encounter (HOSPITAL_COMMUNITY): Payer: Self-pay | Admitting: *Deleted

## 2016-06-22 ENCOUNTER — Emergency Department (HOSPITAL_COMMUNITY)
Admission: EM | Admit: 2016-06-22 | Discharge: 2016-06-23 | Disposition: A | Discharge status: Home / Self Care | Payer: Medicare Other | Attending: Emergency Medicine | Admitting: Emergency Medicine

## 2016-06-22 DIAGNOSIS — J039 Acute tonsillitis, unspecified: Secondary | ICD-10-CM | POA: Diagnosis not present

## 2016-06-22 DIAGNOSIS — Z79899 Other long term (current) drug therapy: Secondary | ICD-10-CM | POA: Insufficient documentation

## 2016-06-22 DIAGNOSIS — J45909 Unspecified asthma, uncomplicated: Secondary | ICD-10-CM | POA: Diagnosis not present

## 2016-06-22 DIAGNOSIS — J029 Acute pharyngitis, unspecified: Secondary | ICD-10-CM | POA: Diagnosis present

## 2016-06-22 MED ORDER — SODIUM CHLORIDE 0.9 % IV SOLN
Freq: Once | INTRAVENOUS | Status: AC
  Administered 2016-06-23: via INTRAVENOUS

## 2016-06-22 NOTE — ED Triage Notes (Signed)
Pt was seen at [facility] two days ago for sore throat, had negative strep but started on amoxicillin. Pt has increase in swelling to left side of throat and face, reports chills and difficulty swallowing. Airway intact at triage.

## 2016-06-23 DIAGNOSIS — J039 Acute tonsillitis, unspecified: Secondary | ICD-10-CM | POA: Diagnosis not present

## 2016-06-23 LAB — CBC WITH DIFFERENTIAL/PLATELET
BASOS ABS: 0 10*3/uL (ref 0.0–0.1)
Basophils Relative: 0 %
EOS ABS: 0.1 10*3/uL (ref 0.0–0.7)
EOS PCT: 2 %
HCT: 42.3 % (ref 36.0–46.0)
Hemoglobin: 13.7 g/dL (ref 12.0–15.0)
Lymphocytes Relative: 20 %
Lymphs Abs: 1.9 10*3/uL (ref 0.7–4.0)
MCH: 28.2 pg (ref 26.0–34.0)
MCHC: 32.4 g/dL (ref 30.0–36.0)
MCV: 87 fL (ref 78.0–100.0)
MONO ABS: 0.6 10*3/uL (ref 0.1–1.0)
Monocytes Relative: 6 %
Neutro Abs: 6.9 10*3/uL (ref 1.7–7.7)
Neutrophils Relative %: 72 %
PLATELETS: 412 10*3/uL — AB (ref 150–400)
RBC: 4.86 MIL/uL (ref 3.87–5.11)
RDW: 13.9 % (ref 11.5–15.5)
WBC: 9.5 10*3/uL (ref 4.0–10.5)

## 2016-06-23 LAB — BASIC METABOLIC PANEL
ANION GAP: 6 (ref 5–15)
BUN: 12 mg/dL (ref 6–20)
CALCIUM: 9.2 mg/dL (ref 8.9–10.3)
CO2: 27 mmol/L (ref 22–32)
CREATININE: 0.87 mg/dL (ref 0.44–1.00)
Chloride: 105 mmol/L (ref 101–111)
GLUCOSE: 125 mg/dL — AB (ref 65–99)
Potassium: 3.5 mmol/L (ref 3.5–5.1)
Sodium: 138 mmol/L (ref 135–145)

## 2016-06-23 LAB — MONONUCLEOSIS SCREEN: MONO SCREEN: NEGATIVE

## 2016-06-23 MED ORDER — DEXTROSE 5 % IV SOLN
1.0000 g | Freq: Once | INTRAVENOUS | Status: AC
  Administered 2016-06-23: 1 g via INTRAVENOUS
  Filled 2016-06-23: qty 10

## 2016-06-23 NOTE — Discharge Instructions (Signed)
Continue current antibiotics. Recheckat Urgent care is symptoms persist.

## 2016-06-23 NOTE — ED Notes (Signed)
Pt departed in NAD, refused use of wheelchair.  

## 2016-06-23 NOTE — ED Provider Notes (Addendum)
[facility]-EMERGENCY DEPT Provider Note   CSN: [id number] Arrival date & time: 06/22/16  9450  First Provider Contact:  None       History   Chief Complaint Chief Complaint  Patient presents with  . Sore Throat    HPI Laurie Fields is a 30 y.o. female.  The history is provided by the patient. No language interpreter was used.  Sore Throat  This is a new problem. The current episode started yesterday. The problem occurs constantly. The problem has been gradually worsening. Nothing aggravates the symptoms. Nothing relieves the symptoms. The treatment provided no relief.  Pt has been treated for strep but continues to have a sore throat.  Past Medical History:  Diagnosis Date  . Asthma   . Pseudotumor cerebri     There are no active problems to display for this patient.   Past Surgical History:  Procedure Laterality Date  . ABDOMINAL HYSTERECTOMY    . carpel  tunnel release    . CESAREAN SECTION      OB History    No data available       Home Medications    Prior to Admission medications   Medication Sig Start Date End Date Taking? Authorizing Provider  albuterol (PROVENTIL HFA;VENTOLIN HFA) 108 (90 Base) MCG/ACT inhaler Inhale 2 puffs into the lungs every 6 (six) hours as needed for wheezing or shortness of breath.    Historical Provider, MD  amoxicillin (AMOXIL) 500 MG capsule Take 1 capsule (500 mg total) by mouth 3 (three) times daily. 06/20/16   Tharon Aquas, PA  metroNIDAZOLE (FLAGYL) 500 MG tablet Take 1 tablet (500 mg total) by mouth 3 (three) times daily. 06/08/16   Mancel Bale, MD  oxyCODONE-acetaminophen (PERCOCET) 5-325 MG tablet Take 1 tablet by mouth every 4 (four) hours as needed for severe pain. 06/08/16   Mancel Bale, MD    Family History Family History  Problem Relation Age of Onset  . Hypertension Other   . Diabetes Other   . Cancer Other   . CAD Other   . Leukemia Other     Social History Social History  Substance Use Topics    . Smoking status: Never Smoker  . Smokeless tobacco: Never Used  . Alcohol use No     Allergies   Serevent [salmeterol]   Review of Systems Review of Systems  All other systems reviewed and are negative.    Physical Exam Updated Vital Signs BP 106/71   Pulse 73   Temp 98.7 F (37.1 C) (Oral)   Resp 16   SpO2 100%   Physical Exam  Constitutional: She appears well-developed and well-nourished. No distress.  HENT:  Head: Normocephalic and atraumatic.  Mouth/Throat: Oropharyngeal exudate present.  Swollen left tonsil, erythema.  Tender left neck, no lymphadenopathy  Eyes: Conjunctivae are normal.  Neck: Neck supple.  Cardiovascular: Normal rate and regular rhythm.   No murmur heard. Pulmonary/Chest: Effort normal and breath sounds normal. No respiratory distress.  Abdominal: Soft. There is no tenderness.  Musculoskeletal: She exhibits no edema.  Neurological: She is alert.  Skin: Skin is warm and dry.  Psychiatric: She has a normal mood and affect.  Nursing note and vitals reviewed.    ED Treatments / Results  Labs (all labs ordered are listed, but only abnormal results are displayed) Labs Reviewed  CBC WITH DIFFERENTIAL/PLATELET - Abnormal; Notable for the following:       Result Value   Platelets 412 (*)    All  other components within normal limits  BASIC METABOLIC PANEL - Abnormal; Notable for the following:    Glucose, Bld 125 (*)    All other components within normal limits  MONONUCLEOSIS SCREEN    EKG  EKG Interpretation None       Radiology No results found.  Procedures Procedures (including critical care time)  Medications Ordered in ED Medications  cefTRIAXone (ROCEPHIN) 1 g in dextrose 5 % 50 mL IVPB (not administered)  0.9 %  sodium chloride infusion ( Intravenous New Bag/Given 06/23/16 0000)     Initial Impression / Assessment and Plan / ED Course  I have reviewed the triage vital signs and the nursing notes.  Pertinent labs &  imaging results that were available during my care of the patient were reviewed by me and considered in my medical decision making (see chart for details).  Clinical Course    Pt given Iv NS. Rocephin 1 gram Iv.   Pt advised to contiue amoxicillian.   Final Clinical Impressions(s) / ED Diagnoses   Final diagnoses:  Tonsillitis    New Prescriptions New Prescriptions   No medications on file     Elson Areas, Cordelia Poche 06/23/16 0100    Maia Plan, MD 06/23/16 [address] [place], [place] 10/08/16 1144    Maia Plan, MD 10/08/16 1149

## 2016-08-15 ENCOUNTER — Emergency Department (HOSPITAL_COMMUNITY): Payer: Medicare Other

## 2016-08-15 ENCOUNTER — Emergency Department (HOSPITAL_COMMUNITY)
Admission: EM | Admit: 2016-08-15 | Discharge: 2016-08-15 | Disposition: A | Discharge status: Home / Self Care | Payer: Medicare Other | Attending: Emergency Medicine | Admitting: Emergency Medicine

## 2016-08-15 ENCOUNTER — Encounter (HOSPITAL_COMMUNITY): Payer: Self-pay

## 2016-08-15 DIAGNOSIS — Z7951 Long term (current) use of inhaled steroids: Secondary | ICD-10-CM | POA: Diagnosis not present

## 2016-08-15 DIAGNOSIS — R51 Headache: Secondary | ICD-10-CM | POA: Diagnosis not present

## 2016-08-15 DIAGNOSIS — R110 Nausea: Secondary | ICD-10-CM | POA: Insufficient documentation

## 2016-08-15 DIAGNOSIS — J45909 Unspecified asthma, uncomplicated: Secondary | ICD-10-CM | POA: Diagnosis not present

## 2016-08-15 DIAGNOSIS — R519 Headache, unspecified: Secondary | ICD-10-CM

## 2016-08-15 MED ORDER — ACETAZOLAMIDE SODIUM 500 MG IJ SOLR
500.0000 mg | Freq: Once | INTRAMUSCULAR | Status: AC
  Administered 2016-08-15: 500 mg via INTRAVENOUS
  Filled 2016-08-15: qty 500

## 2016-08-15 MED ORDER — DEXAMETHASONE SODIUM PHOSPHATE 10 MG/ML IJ SOLN
10.0000 mg | Freq: Once | INTRAMUSCULAR | Status: AC
  Administered 2016-08-15: 10 mg via INTRAMUSCULAR

## 2016-08-15 MED ORDER — METOCLOPRAMIDE HCL 5 MG/ML IJ SOLN
10.0000 mg | Freq: Once | INTRAMUSCULAR | Status: AC
  Administered 2016-08-15: 10 mg via INTRAMUSCULAR

## 2016-08-15 MED ORDER — KETOROLAC TROMETHAMINE 30 MG/ML IJ SOLN
30.0000 mg | Freq: Once | INTRAMUSCULAR | Status: AC
  Administered 2016-08-15: 30 mg via INTRAVENOUS
  Filled 2016-08-15: qty 1

## 2016-08-15 MED ORDER — DEXAMETHASONE SODIUM PHOSPHATE 10 MG/ML IJ SOLN
10.0000 mg | Freq: Once | INTRAMUSCULAR | Status: DC
  Filled 2016-08-15: qty 1

## 2016-08-15 MED ORDER — DIPHENHYDRAMINE HCL 50 MG/ML IJ SOLN
12.5000 mg | Freq: Once | INTRAMUSCULAR | Status: DC
  Filled 2016-08-15: qty 1

## 2016-08-15 MED ORDER — METOCLOPRAMIDE HCL 5 MG/ML IJ SOLN
10.0000 mg | Freq: Once | INTRAMUSCULAR | Status: DC
  Filled 2016-08-15: qty 2

## 2016-08-15 MED ORDER — DIPHENHYDRAMINE HCL 50 MG/ML IJ SOLN
25.0000 mg | Freq: Once | INTRAMUSCULAR | Status: AC
  Administered 2016-08-15: 25 mg via INTRAMUSCULAR

## 2016-08-15 MED ORDER — STERILE WATER FOR INJECTION IJ SOLN
INTRAMUSCULAR | Status: AC
  Administered 2016-08-15: 04:00:00
  Filled 2016-08-15: qty 10

## 2016-08-15 NOTE — ED Provider Notes (Signed)
[facility]-EMERGENCY DEPT Provider Note   CSN: [id number] Arrival date & time: 08/15/16  0201    History   Chief Complaint Chief Complaint  Patient presents with  . Headache    HPI Laurie Fields is a 30 y.o. female.  30 year old female with a history of asthma and pseudotumor presents to the emergency department for evaluation of a sharp, posterior headache 3 days. Patient rates her pain at 8/10. Pain is aggravated when lying flat. She denies taking any medications for symptoms. 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 She states that pain has progressively worsened and has become associated with blurry vision and dizziness. Patient also complaining of nausea without emesis. She has had some episodes of watery diarrhea. Patient denies any recent head injury, syncope, or fever. She states that her pain feels similar to past episodes of pseudotumor. She was previously followed by [facility] neurology, but has not seen them in over a year. She is not currently on anything for her pseudotumor. Patient reports previously being on Diamox, but states that it did not work for her. Her neurologist apparently transitioned her to Lasix, but this was also later discontinued.   The history is provided by the patient. No language interpreter was used.  Headache   Associated symptoms include nausea. Pertinent negatives include no fever.    Past Medical History:  Diagnosis Date  . Asthma   . Pseudotumor cerebri     There are no active problems to display for this patient.   Past Surgical History:  Procedure Laterality Date  . ABDOMINAL HYSTERECTOMY    . carpel  tunnel release    . CESAREAN SECTION      OB History    No data available       Home Medications    Prior to Admission medications   Medication Sig Start Date End Date Taking? Authorizing Provider  albuterol (PROVENTIL  HFA;VENTOLIN HFA) 108 (90 Base) MCG/ACT inhaler Inhale 2 puffs into the lungs every 6 (six) hours as needed for wheezing or shortness of breath.   Yes Historical Provider, MD  amoxicillin (AMOXIL) 500 MG capsule Take 1 capsule (500 mg total) by mouth 3 (three) times daily. Patient not taking: Reported on 08/15/2016 06/20/16   Tharon Aquas, PA  metroNIDAZOLE (FLAGYL) 500 MG tablet Take 1 tablet (500 mg total) by mouth 3 (three) times daily. Patient not taking: Reported on 08/15/2016 06/08/16   Mancel Bale, MD  oxyCODONE-acetaminophen (PERCOCET) 5-325 MG tablet Take 1 tablet by mouth every 4 (four) hours as needed for severe pain. Patient not taking: Reported on 08/15/2016 06/08/16   Mancel Bale, MD    Family History Family History  Problem Relation Age of Onset  . Hypertension Other   . Diabetes Other   . Cancer Other   . CAD Other   . Leukemia Other     Social History Social History  Substance Use Topics  . Smoking status: Never Smoker  . Smokeless tobacco: Never Used  . Alcohol use No     Allergies   Serevent [salmeterol]   Review of Systems Review of Systems  Constitutional: Negative for fever.  Eyes: Positive for photophobia and visual disturbance.  Gastrointestinal: Positive for nausea.  Neurological: Positive for dizziness and headaches. Negative for syncope.  Ten systems reviewed and are negative for acute change, except as noted in the HPI.     Physical Exam Updated  Vital Signs BP 123/88 (BP Location: Left Arm)   Pulse 87   Temp 97.9 F (36.6 C) (Oral)   Resp 20   SpO2 96%   Physical Exam  Constitutional: She is oriented to person, place, and time. She appears well-developed and well-nourished. No distress.  Nontoxic appearing in no distress  HENT:  Head: Normocephalic and atraumatic.  Eyes: Conjunctivae and EOM are normal. Pupils are equal, round, and reactive to light. No scleral icterus.  Unable to visualize papilledema. No hyphema. No proptosis.  EOMs normal. Pupils equal round and reactive to direct and consensual light.  Neck: Normal range of motion.  No nuchal rigidity or meningismus  Cardiovascular: Normal rate, regular rhythm and intact distal pulses.   Pulmonary/Chest: Effort normal. No respiratory distress. She has no wheezes.  Respirations even and unlabored  Musculoskeletal: Normal range of motion.  Neurological: She is alert and oriented to person, place, and time. No cranial nerve deficit. She exhibits normal muscle tone. Coordination normal.  GCS 15. Speech is goal oriented. No cranial nerve deficits appreciated; symmetric eyebrow raise, no facial drooping, tongue midline. Patient has equal grip strength bilaterally with 5/5 strength against resistance in all major muscle groups bilaterally. Sensation to light touch intact. Patient moves extremities without ataxia.  Skin: Skin is warm and dry. No rash noted. She is not diaphoretic. No erythema. No pallor.  Psychiatric: She has a normal mood and affect. Her behavior is normal.  Nursing note and vitals reviewed.    ED Treatments / Results  Labs (all labs ordered are listed, but only abnormal results are displayed) Labs Reviewed - No data to display  EKG  EKG Interpretation None       Radiology Ct Head Wo Contrast  Result Date: 08/15/2016 CLINICAL DATA:  Initial evaluation for acute posterior headache for 3 days. History of pseudotumor cerebral are eye. EXAM: CT HEAD WITHOUT CONTRAST TECHNIQUE: Contiguous axial images were obtained from the base of the skull through the vertex without intravenous contrast. COMPARISON:  None available. FINDINGS: Brain: Cerebral volume within normal limits. No acute intracranial hemorrhage. No evidence for acute large vessel territory infarct. No mass lesion, midline shift or mass effect. No hydrocephalus. No extra-axial fluid collection. Vascular: No hyperdense vessel identified. Skull: Scalp soft tissues within normal limits. Calvarium  intact. Remote defect noted at the left lamina papyracea. Sinuses/Orbits: Globes and orbits within normal limits. Visualized paranasal sinuses are clear. No mastoid effusion. Other: No other significant finding. IMPRESSION: Normal head CT.  No acute intracranial process identified. Electronically Signed   By: Rise Mu M.D.   On: 08/15/2016 04:54    Procedures Procedures (including critical care time)  Medications Ordered in ED Medications  ketorolac (TORADOL) 30 MG/ML injection 30 mg (not administered)  acetaZOLAMIDE (DIAMOX) injection 500 mg (500 mg Intravenous Given 08/15/16 0424)  dexamethasone (DECADRON) injection 10 mg (10 mg Intramuscular Given 08/15/16 0412)  metoCLOPramide (REGLAN) injection 10 mg (10 mg Intramuscular Given 08/15/16 0412)  diphenhydrAMINE (BENADRYL) injection 25 mg (25 mg Intramuscular Given 08/15/16 0412)  sterile water (preservative free) injection (  Given 08/15/16 0424)     Initial Impression / Assessment and Plan / ED Course  I have reviewed the triage vital signs and the nursing notes.  Pertinent labs & imaging results that were available during my care of the patient were reviewed by me and considered in my medical decision making (see chart for details).  Clinical Course    5:25 AM Patient initially sleeping on reassessment. Patient  [facility] [place], [place] No matching records found on Care Everywhere. Patient still c/o pain despite medications. Will attempt to coordinate outpatient LP by IR. She has no focal neurologic deficits on exam today. No fever, nuchal rigidity, or meningismus to suggest meningitis. CT head negative for emergent intracranial process.  6:02 AM I have consulted with Dr. Deanne CofferHassell of IR regarding process for outpatient referral. Order placed for fluoro guided LP and information given for [facility]. Plan to also refer to neurology and  ophthalmology. Return precautions provided. Patient hemodynamically stable; discharged in satisfactory condition.   Vitals:   08/15/16 0209 08/15/16 0531  BP: 123/88 113/74  Pulse: 87 60  Resp: 20 18  Temp: 97.9 F (36.6 C) 98 F (36.7 C)  TempSrc: Oral Oral  SpO2: 96% 100%    Final Clinical Impressions(s) / ED Diagnoses   Final diagnoses:  Bad headache    New Prescriptions New Prescriptions   No medications on file     Antony MaduraKelly Mishawn Hemann, PA-C 08/15/16 0604    April Palumbo, MD 08/15/16 2303

## 2016-08-15 NOTE — ED Notes (Signed)
Pt reports having a history of pseudotumor cerebri and has had a headache for the last 3 days with pain currently 8/10. Pain is located in the back of head and is worsened when laying down. Pt stated vision is blurry even when wearing eyeglasses. C/o diarrhea and nausea.

## 2016-08-15 NOTE — Discharge Instructions (Signed)
Call the spine center this morning to schedule a lumbar puncture for management of your headache. We also recommend he follow-up with an eye doctor giving your reported vision changes. You have been referred to a neurologist for outpatient follow-up giving your history of pseudotumor. May continue with Tylenol or ibuprofen until able to receive an LP. [facility], for worsening symptoms.

## 2016-08-15 NOTE — ED Notes (Signed)
Attempt x 1 with #20 angio right ACF unsuccessful.  Attempt x 1 with #22 right hand unsuccessful.  Ultrasound IV medial right upper arm with #20 angio, 2 ",small amount blood obtained, would not flush. Primary RN made aware, Toni AmendCourtney.

## 2016-08-15 NOTE — ED Triage Notes (Signed)
Pt complains of a headache for three days, she states she's nauseated also,

## 2016-09-13 ENCOUNTER — Emergency Department (HOSPITAL_COMMUNITY): Payer: Medicare Other

## 2016-09-13 ENCOUNTER — Emergency Department (HOSPITAL_COMMUNITY)
Admission: EM | Admit: 2016-09-13 | Discharge: 2016-09-13 | Disposition: A | Discharge status: Home / Self Care | Payer: Medicare Other | Attending: Emergency Medicine | Admitting: Emergency Medicine

## 2016-09-13 ENCOUNTER — Encounter (HOSPITAL_COMMUNITY): Payer: Self-pay | Admitting: Emergency Medicine

## 2016-09-13 DIAGNOSIS — J029 Acute pharyngitis, unspecified: Secondary | ICD-10-CM | POA: Diagnosis present

## 2016-09-13 DIAGNOSIS — J45909 Unspecified asthma, uncomplicated: Secondary | ICD-10-CM | POA: Insufficient documentation

## 2016-09-13 DIAGNOSIS — R109 Unspecified abdominal pain: Secondary | ICD-10-CM

## 2016-09-13 DIAGNOSIS — J020 Streptococcal pharyngitis: Secondary | ICD-10-CM | POA: Diagnosis not present

## 2016-09-13 HISTORY — DX: Unspecified ovarian cyst, unspecified side: N83.209

## 2016-09-13 LAB — COMPREHENSIVE METABOLIC PANEL WITH GFR
ALT: 11 U/L — ABNORMAL LOW (ref 14–54)
AST: 13 U/L — ABNORMAL LOW (ref 15–41)
Albumin: 3.9 g/dL (ref 3.5–5.0)
Alkaline Phosphatase: 61 U/L (ref 38–126)
Anion gap: 6 (ref 5–15)
BUN: 14 mg/dL (ref 6–20)
CO2: 27 mmol/L (ref 22–32)
Calcium: 8.6 mg/dL — ABNORMAL LOW (ref 8.9–10.3)
Chloride: 105 mmol/L (ref 101–111)
Creatinine, Ser: 0.79 mg/dL (ref 0.44–1.00)
GFR calc Af Amer: >60 mL/min (ref >60)
GFR calc non Af Amer: >60 mL/min (ref >60)
Glucose, Bld: 92 mg/dL (ref 65–99)
Potassium: 3.6 mmol/L (ref 3.5–5.1)
Sodium: 138 mmol/L (ref 135–145)
Total Bilirubin: 0.6 mg/dL (ref 0.3–1.2)
Total Protein: 7.9 g/dL (ref 6.5–8.1)

## 2016-09-13 LAB — URINALYSIS, ROUTINE W REFLEX MICROSCOPIC
BILIRUBIN URINE: NEGATIVE
Glucose, UA: NEGATIVE mg/dL
HGB URINE DIPSTICK: NEGATIVE
Ketones, ur: NEGATIVE mg/dL
Leukocytes, UA: NEGATIVE
Nitrite: NEGATIVE
PROTEIN: NEGATIVE mg/dL
SPECIFIC GRAVITY, URINE: 1.028 (ref 1.005–1.030)
pH: 6.5 (ref 5.0–8.0)

## 2016-09-13 LAB — CBC
HCT: 35.9 % — ABNORMAL LOW (ref 36.0–46.0)
Hemoglobin: 11.9 g/dL — ABNORMAL LOW (ref 12.0–15.0)
MCH: 27.9 pg (ref 26.0–34.0)
MCHC: 33.1 g/dL (ref 30.0–36.0)
MCV: 84.3 fL (ref 78.0–100.0)
Platelets: 354 K/uL (ref 150–400)
RBC: 4.26 MIL/uL (ref 3.87–5.11)
RDW: 13.8 % (ref 11.5–15.5)
WBC: 12.2 K/uL — ABNORMAL HIGH (ref 4.0–10.5)

## 2016-09-13 LAB — RAPID STREP SCREEN (MED CTR MEBANE ONLY): Streptococcus, Group A Screen (Direct): POSITIVE — AB

## 2016-09-13 LAB — LIPASE, BLOOD: Lipase: 25 U/L (ref 11–51)

## 2016-09-13 MED ORDER — PENICILLIN G BENZATHINE 1200000 UNIT/2ML IM SUSP
1.2000 10*6.[IU] | Freq: Once | INTRAMUSCULAR | Status: AC
  Administered 2016-09-13: 1.2 10*6.[IU] via INTRAMUSCULAR
  Filled 2016-09-13: qty 2

## 2016-09-13 MED ORDER — HYDROCODONE-ACETAMINOPHEN 7.5-325 MG/15ML PO SOLN: 15.0000 mL | Freq: Four times a day (QID) | ORAL | 0 refills | Status: DC | PRN

## 2016-09-13 MED ORDER — DEXAMETHASONE 1 MG/ML PO CONC
6.0000 mg | Freq: Once | ORAL | Status: AC
  Administered 2016-09-13: 6 mg via ORAL
  Filled 2016-09-13: qty 6

## 2016-09-13 NOTE — Discharge Instructions (Signed)
Follow-up for the MRI as discussed.

## 2016-09-13 NOTE — ED Notes (Signed)
I attempted to collect labs and was unsuccessful. 

## 2016-09-13 NOTE — ED Provider Notes (Signed)
[facility]-EMERGENCY DEPT Provider Note   CSN: [id number] Arrival date & time: 09/13/16  1547     History   Chief Complaint Chief Complaint  Patient presents with  . Sore Throat  . Abdominal Pain    HPI Laurie Fields is a 30 y.o. female.  HPI Patient presents with sore throat. Worse on the right side. She has had it for the last few days. Worse with swallowing. No difficult breathing. States she's had chills. Also has had abdominal pain. Is on the left side. Has a history of some chronic abdominal pain and has a cyst. Previous CT scan showed cyst that needed MRI. States she is not followed up for the MRI appeared states she's also had some dysuria.    Past Medical History:  Diagnosis Date  . Asthma   . Ovarian cyst   . Pseudotumor cerebri     There are no active problems to display for this patient.   Past Surgical History:  Procedure Laterality Date  . ABDOMINAL HYSTERECTOMY    . carpel  tunnel release    . CESAREAN SECTION      OB History    No data available       Home Medications    Prior to Admission medications   Medication Sig Start Date End Date Taking? Authorizing Provider  albuterol (PROVENTIL HFA;VENTOLIN HFA) 108 (90 Base) MCG/ACT inhaler Inhale 2 puffs into the lungs every 6 (six) hours as needed for wheezing or shortness of breath.   Yes Historical Provider, MD  amoxicillin (AMOXIL) 500 MG capsule Take 1 capsule (500 mg total) by mouth 3 (three) times daily. Patient not taking: Reported on 08/15/2016 06/20/16   Tharon Aquas, PA  metroNIDAZOLE (FLAGYL) 500 MG tablet Take 1 tablet (500 mg total) by mouth 3 (three) times daily. Patient not taking: Reported on 08/15/2016 06/08/16   Mancel Bale, MD  oxyCODONE-acetaminophen (PERCOCET) 5-325 MG tablet Take 1 tablet by mouth every 4 (four) hours as needed for severe pain. Patient not taking: Reported on 08/15/2016 06/08/16   Mancel Bale, MD    Family History Family History  Problem Relation Age  of Onset  . Hypertension Other   . Diabetes Other   . Cancer Other   . CAD Other   . Leukemia Other     Social History Social History  Substance Use Topics  . Smoking status: Never Smoker  . Smokeless tobacco: Never Used  . Alcohol use No     Allergies   Serevent [salmeterol]   Review of Systems Review of Systems  Constitutional: Positive for appetite change and chills.  HENT: Positive for sore throat.   Eyes: Negative for visual disturbance.  Respiratory: Negative for shortness of breath.   Gastrointestinal: Positive for abdominal pain.  Genitourinary: Negative for dysuria.  Musculoskeletal: Positive for back pain.  Skin: Negative for wound.  Neurological: Negative for dizziness.  Hematological: Negative for adenopathy.  Psychiatric/Behavioral: Negative for confusion.     Physical Exam Updated Vital Signs BP 113/85 (BP Location: Left Arm)   Pulse 80   Temp 98.2 F (36.8 C) (Oral)   Resp 17   Ht 5\' 5"  (1.651 m)   Wt (!) 305 lb (138.3 kg)   SpO2 100%   BMI 50.75 kg/m   Physical Exam  Constitutional: She appears well-developed.  HENT:  Bilateral tonsillar edema, worse in the right. Not quite kissing but are nearing. No peritonsillar swelling. Uvula midline. No stridor.  Neck: Neck supple.  Cardiovascular: Normal rate.  Pulmonary/Chest: Effort normal.  Abdominal: Soft.  Left-sided lower abdominal tenderness.  Musculoskeletal: Normal range of motion. She exhibits no edema.  Lymphadenopathy:    She has cervical adenopathy.  Neurological: She is alert.  Psychiatric: She has a normal mood and affect.     ED Treatments / Results  Labs (all labs ordered are listed, but only abnormal results are displayed) Labs Reviewed  RAPID STREP SCREEN (NOT AT [facility]) - Abnormal; Notable for the following:       Result Value   Streptococcus, Group A Screen (Direct) POSITIVE (*)    All other components within normal limits  COMPREHENSIVE METABOLIC PANEL - Abnormal;  Notable for the following:    Calcium 8.6 (*)    AST 13 (*)    ALT 11 (*)    All other components within normal limits  CBC - Abnormal; Notable for the following:    WBC 12.2 (*)    Hemoglobin 11.9 (*)    HCT 35.9 (*)    All other components within normal limits  LIPASE, BLOOD  URINALYSIS, ROUTINE W REFLEX MICROSCOPIC (NOT AT [facility])    EKG  EKG Interpretation None       Radiology Dg Chest 2 View  Result Date: 09/13/2016 CLINICAL DATA:  Sore throat and cough for several days EXAM: CHEST  2 VIEW COMPARISON:  None. FINDINGS: The heart size and mediastinal contours are within normal limits. Both lungs are clear. The visualized skeletal structures are unremarkable. IMPRESSION: No active cardiopulmonary disease. Electronically Signed   By: Alcide CleverMark  Lukens M.D.   On: 09/13/2016 16:27    Procedures Procedures (including critical care time)  Medications Ordered in ED Medications  penicillin g benzathine (BICILLIN LA) 1200000 UNIT/2ML injection 1.2 Million Units (not administered)  dexamethasone (DECADRON) 1 MG/ML solution 6 mg (not administered)     Initial Impression / Assessment and Plan / ED Course  I have reviewed the triage vital signs and the nursing notes.  Pertinent labs & imaging results that were available during my care of the patient were reviewed by me and considered in my medical decision making (see chart for details).  Clinical Course      Strep throat. No apparent peritonsillar abscess. Follow-up with GYN for previous described cyst. Pain medicine and steroids. I am antibiotics.  Final Clinical Impressions(s) / ED Diagnoses   Final diagnoses:  Strep pharyngitis  Abdominal pain, unspecified abdominal location    New Prescriptions New Prescriptions   No medications on file     Benjiman CoreNathan Katura Eatherly, MD 09/13/16 [id number]

## 2016-09-13 NOTE — ED Triage Notes (Signed)
Patient reports for the last 2 days having left side abdominal pain and tenderness to touch. Hx of ovarian cyst and this pain is similar but worse. Having frequency with urination and pressure and some incontinence. Denies burning or odor with urination. Patient reports the sore throat started about 4 days ago.

## 2016-09-25 ENCOUNTER — Encounter (HOSPITAL_COMMUNITY): Payer: Self-pay | Admitting: Emergency Medicine

## 2016-09-25 ENCOUNTER — Emergency Department (HOSPITAL_COMMUNITY)
Admission: EM | Admit: 2016-09-25 | Discharge: 2016-09-25 | Disposition: A | Discharge status: Home / Self Care | Payer: Medicare Other | Attending: Emergency Medicine | Admitting: Emergency Medicine

## 2016-09-25 ENCOUNTER — Emergency Department (HOSPITAL_COMMUNITY): Payer: Medicare Other

## 2016-09-25 DIAGNOSIS — Y999 Unspecified external cause status: Secondary | ICD-10-CM | POA: Insufficient documentation

## 2016-09-25 DIAGNOSIS — Y9389 Activity, other specified: Secondary | ICD-10-CM | POA: Insufficient documentation

## 2016-09-25 DIAGNOSIS — Y92410 Unspecified street and highway as the place of occurrence of the external cause: Secondary | ICD-10-CM | POA: Diagnosis not present

## 2016-09-25 DIAGNOSIS — S169XXA Unspecified injury of muscle, fascia and tendon at neck level, initial encounter: Secondary | ICD-10-CM | POA: Diagnosis present

## 2016-09-25 DIAGNOSIS — M545 Low back pain: Secondary | ICD-10-CM | POA: Diagnosis not present

## 2016-09-25 DIAGNOSIS — M542 Cervicalgia: Secondary | ICD-10-CM | POA: Diagnosis not present

## 2016-09-25 DIAGNOSIS — J45909 Unspecified asthma, uncomplicated: Secondary | ICD-10-CM | POA: Insufficient documentation

## 2016-09-25 DIAGNOSIS — R51 Headache: Secondary | ICD-10-CM | POA: Insufficient documentation

## 2016-09-25 DIAGNOSIS — Z79899 Other long term (current) drug therapy: Secondary | ICD-10-CM | POA: Insufficient documentation

## 2016-09-25 LAB — PREGNANCY, URINE: Preg Test, Ur: NEGATIVE

## 2016-09-25 MED ORDER — NAPROXEN 500 MG PO TABS: 500.0000 mg | ORAL_TABLET | Freq: Two times a day (BID) | ORAL | 0 refills | Status: DC

## 2016-09-25 MED ORDER — CYCLOBENZAPRINE HCL 10 MG PO TABS
5.0000 mg | ORAL_TABLET | Freq: Once | ORAL | Status: DC
  Filled 2016-09-25: qty 1

## 2016-09-25 MED ORDER — NAPROXEN 500 MG PO TABS: 500.0000 mg | ORAL_TABLET | Freq: Once | ORAL | Status: DC

## 2016-09-25 MED ORDER — CYCLOBENZAPRINE HCL 5 MG PO TABS: 5.0000 mg | ORAL_TABLET | Freq: Three times a day (TID) | ORAL | 0 refills | Status: DC | PRN

## 2016-09-25 MED ORDER — NAPROXEN 500 MG PO TABS
500.0000 mg | ORAL_TABLET | Freq: Once | ORAL | Status: AC
  Administered 2016-09-25: 500 mg via ORAL
  Filled 2016-09-25: qty 1

## 2016-09-25 MED ORDER — CYCLOBENZAPRINE HCL 10 MG PO TABS: 5.0000 mg | ORAL_TABLET | Freq: Once | ORAL | Status: DC

## 2016-09-25 NOTE — Discharge Instructions (Signed)
Read the information below.  °Your x-rays were re-assuring.  °You may feel sore for the next 2-3 days. I have prescribed naprosyn and flexeril for relief. While taking naprosyn do not take other NSAIDs (ibuprofen, motrin, or aleve). Flexeril can make you drowsy, do not drive after taking.  °You can apply heat/ice to affected areas for 20 minute increments.  °Warm showers can soothe sore muscles.  °If symptoms persist for more than a week follow up with your primary provider.  °Use the prescribed medication as directed.  Please discuss all new medications with your pharmacist.   °You may return to the Emergency Department at any time for worsening condition or any new symptoms that concern you. ° ° ° °
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
## 2016-10-04 ENCOUNTER — Encounter (HOSPITAL_COMMUNITY): Payer: Self-pay

## 2016-10-04 ENCOUNTER — Emergency Department (HOSPITAL_COMMUNITY)
Admission: EM | Admit: 2016-10-04 | Discharge: 2016-10-04 | Disposition: A | Discharge status: Home / Self Care | Payer: Medicare Other | Attending: Emergency Medicine | Admitting: Emergency Medicine

## 2016-10-04 DIAGNOSIS — Y999 Unspecified external cause status: Secondary | ICD-10-CM | POA: Insufficient documentation

## 2016-10-04 DIAGNOSIS — J45909 Unspecified asthma, uncomplicated: Secondary | ICD-10-CM | POA: Insufficient documentation

## 2016-10-04 DIAGNOSIS — Y939 Activity, unspecified: Secondary | ICD-10-CM | POA: Insufficient documentation

## 2016-10-04 DIAGNOSIS — M5442 Lumbago with sciatica, left side: Secondary | ICD-10-CM

## 2016-10-04 DIAGNOSIS — M545 Low back pain: Secondary | ICD-10-CM | POA: Diagnosis present

## 2016-10-04 DIAGNOSIS — Y92410 Unspecified street and highway as the place of occurrence of the external cause: Secondary | ICD-10-CM | POA: Diagnosis not present

## 2016-10-04 LAB — URINALYSIS, ROUTINE W REFLEX MICROSCOPIC
Glucose, UA: NEGATIVE mg/dL
HGB URINE DIPSTICK: NEGATIVE
Ketones, ur: NEGATIVE mg/dL
Leukocytes, UA: NEGATIVE
Nitrite: NEGATIVE
PH: 6.5 (ref 5.0–8.0)
Protein, ur: NEGATIVE mg/dL
SPECIFIC GRAVITY, URINE: 1.031 — AB (ref 1.005–1.030)

## 2016-10-04 LAB — PREGNANCY, URINE: PREG TEST UR: NEGATIVE

## 2016-10-04 MED ORDER — IBUPROFEN 600 MG PO TABS: 600.0000 mg | ORAL_TABLET | Freq: Four times a day (QID) | ORAL | 0 refills | Status: DC | PRN

## 2016-10-04 MED ORDER — DICLOFENAC SODIUM 3 % TD GEL: 1.0000 "application " | Freq: Two times a day (BID) | TRANSDERMAL | 0 refills | Status: DC

## 2016-10-04 MED ORDER — METHOCARBAMOL 500 MG PO TABS: 500.0000 mg | ORAL_TABLET | Freq: Two times a day (BID) | ORAL | 0 refills | Status: DC | PRN
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
## 2016-10-04 NOTE — ED Notes (Signed)
EDP at bedside  

## 2016-10-31 ENCOUNTER — Emergency Department (HOSPITAL_COMMUNITY)
Admission: EM | Admit: 2016-10-31 | Discharge: 2016-11-01 | Disposition: A | Discharge status: Home / Self Care | Payer: Medicare Other | Attending: Emergency Medicine | Admitting: Emergency Medicine

## 2016-10-31 ENCOUNTER — Encounter (HOSPITAL_COMMUNITY): Payer: Self-pay

## 2016-10-31 DIAGNOSIS — J45909 Unspecified asthma, uncomplicated: Secondary | ICD-10-CM | POA: Insufficient documentation

## 2016-10-31 DIAGNOSIS — N83202 Unspecified ovarian cyst, left side: Secondary | ICD-10-CM

## 2016-10-31 DIAGNOSIS — Z79899 Other long term (current) drug therapy: Secondary | ICD-10-CM | POA: Insufficient documentation

## 2016-10-31 DIAGNOSIS — R1032 Left lower quadrant pain: Secondary | ICD-10-CM | POA: Diagnosis present

## 2016-10-31 LAB — URINALYSIS, ROUTINE W REFLEX MICROSCOPIC
BILIRUBIN URINE: NEGATIVE
Glucose, UA: NEGATIVE mg/dL
Hgb urine dipstick: NEGATIVE
Ketones, ur: NEGATIVE mg/dL
LEUKOCYTES UA: NEGATIVE
NITRITE: NEGATIVE
PH: 6 (ref 5.0–8.0)
Protein, ur: NEGATIVE mg/dL
SPECIFIC GRAVITY, URINE: 1.028 (ref 1.005–1.030)

## 2016-10-31 MED ORDER — KETOROLAC TROMETHAMINE 60 MG/2ML IM SOLN
60.0000 mg | Freq: Once | INTRAMUSCULAR | Status: AC
  Administered 2016-10-31: 60 mg via INTRAMUSCULAR
  Filled 2016-10-31: qty 2

## 2016-10-31 NOTE — ED Triage Notes (Addendum)
PT C/O LLQ PAIN RADIATING TO THE LOWER BACK WITH HEMATURIA AND URINARY FREQUENCY SINCE YESTERDAY. DENIES FEVER. PT STS SHE STILL HAS HER OVARIES, AND SHE DOES HAVE CYSTS, BUT SHE FEELS A TWISTING SENSATION ON THE LEFT SIDE.

## 2016-10-31 NOTE — ED Provider Notes (Signed)
[facility]-EMERGENCY DEPT Provider Note   CSN: [id number] Arrival date & time: 10/31/16  1819  By signing my name below, I, Octavia Heirrianna Nassar, attest that this documentation has been prepared under the direction and in the presence of TR[employer] AutomotiveKelly Onalee Steinbach, PA-C.  Electronically Signed: Octavia HeirArianna Nassar, ED Scribe. 10/31/16. 9:26 PM.    History   Chief Complaint Chief Complaint  Patient presents with  . Abdominal Pain    LLQ    The history is provided by the patient. No language interpreter was used.   HPI Comments: Laurie Fields is a 30 y.o. female who presents to the Emergency Department complaining of sudden onset, gradual worsening, LLQ abdominal pain that radiates to her left lower back x yesterday. Pt says that her pain started as dull and now feels like a "twisting sensation". She reports associated fatigue, urinary frequency, urinary urgency, dysuria, and and hematuria for one week. Pt reports having cysts on her ovaries with the L>R. Pt has not had a hx of frequent UTI's in the past. Her pain is worse with certain movements. She has surgical hx of hysterectomy in which her cervix was removed also and reports seeing blood when she wiped yesterday. Pt denies fever, Incontinence, nausea or vomiting.   Past Medical History:  Diagnosis Date  . Asthma   . Ovarian cyst   . Pseudotumor cerebri     There are no active problems to display for this patient.   Past Surgical History:  Procedure Laterality Date  . ABDOMINAL HYSTERECTOMY    . carpel  tunnel release    . CESAREAN SECTION      OB History    No data available       Home Medications    Prior to Admission medications   Medication Sig Start Date End Date Taking? Authorizing Provider  albuterol (PROVENTIL HFA;VENTOLIN HFA) 108 (90 Base) MCG/ACT inhaler Inhale 2 puffs into the lungs every 6 (six) hours as needed for wheezing or shortness of breath.   Yes Historical Provider, MD  ibuprofen (ADVIL,MOTRIN) 200 MG tablet Take  400 mg by mouth every 6 (six) hours as needed for moderate pain.   Yes Historical Provider, MD  Diclofenac Sodium 3 % GEL Place 1 application onto the skin 2 (two) times daily. To affected area. Patient not taking: Reported on 10/31/2016 10/04/16   Everlene FarrierWilliam Dansie, PA-C  ibuprofen (ADVIL,MOTRIN) 600 MG tablet Take 1 tablet (600 mg total) by mouth every 6 (six) hours as needed for mild pain or moderate pain. Patient not taking: Reported on 10/31/2016 10/04/16   Everlene FarrierWilliam Dansie, PA-C  methocarbamol (ROBAXIN) 500 MG tablet Take 1 tablet (500 mg total) by mouth 2 (two) times daily as needed for muscle spasms. Patient not taking: Reported on 10/31/2016 10/04/16   Everlene FarrierWilliam Dansie, PA-C  naproxen (NAPROSYN) 500 MG tablet Take 1 tablet (500 mg total) by mouth 2 (two) times daily. 11/01/16   Antony MaduraKelly Adriano Bischof, PA-C  phenazopyridine (PYRIDIUM) 200 MG tablet Take 1 tablet (200 mg total) by mouth 3 (three) times daily. 11/01/16   Antony MaduraKelly Darionna Banke, PA-C  traMADol (ULTRAM) 50 MG tablet Take 1 tablet (50 mg total) by mouth every 6 (six) hours as needed. 11/01/16   Antony MaduraKelly Kebra Lowrimore, PA-C    Family History Family History  Problem Relation Age of Onset  . Hypertension Other   . Diabetes Other   . Cancer Other   . CAD Other   . Leukemia Other     Social History Social History  Substance Use Topics  .  Smoking status: Never Smoker  . Smokeless tobacco: Never Used  . Alcohol use No     Allergies   Serevent [salmeterol]   Review of Systems Review of Systems  Constitutional: Positive for fatigue. Negative for fever.  Gastrointestinal: Negative for nausea and vomiting.  Genitourinary: Positive for difficulty urinating, dysuria, frequency, hematuria and urgency. Negative for vaginal bleeding.  A complete 10 system review of systems was obtained and all systems are negative except as noted in the HPI and PMH.     Physical Exam Updated Vital Signs BP 104/71 (BP Location: Left Arm)   Pulse 62   Temp 97.7 F (36.5 C) (Oral)    Resp 19   Ht 5\' 5"  (1.651 m)   Wt 135.6 kg   SpO2 98%   BMI 49.76 kg/m   Physical Exam  Constitutional: She is oriented to person, place, and time. She appears well-developed and well-nourished. No distress.  Nontoxic appearing and in no distress.  HENT:  Head: Normocephalic and atraumatic.  Eyes: Conjunctivae and EOM are normal. No scleral icterus.  Neck: Normal range of motion.  Cardiovascular: Normal rate, regular rhythm and intact distal pulses.   Pulmonary/Chest: Effort normal. No respiratory distress. She has no wheezes. She has no rales.  Respirations even and unlabored. Lungs clear to auscultation.  Abdominal: Soft. She exhibits no distension and no mass. There is tenderness.  Soft, obese abdomen. There is mild tenderness in the left mid abdomen and left lower quadrant. No peritoneal signs or rigidity.  Musculoskeletal: Normal range of motion.  Mild tenderness to palpation to the left lumbar paraspinal muscles. No spasm.  Neurological: She is alert and oriented to person, place, and time. She exhibits normal muscle tone. Coordination normal.  GCS 15. Patient moving all extremities.  Skin: Skin is warm and dry. No rash noted. She is not diaphoretic. No erythema. No pallor.  Psychiatric: She has a normal mood and affect. Her behavior is normal.  Nursing note and vitals reviewed.    ED Treatments / Results  DIAGNOSTIC STUDIES: Oxygen Saturation is 100% on RA, normal by my interpretation.  COORDINATION OF CARE:  9:24 PM Discussed treatment plan with pt at bedside and pt agreed to plan.  Labs (all labs ordered are listed, but only abnormal results are displayed) Labs Reviewed  URINALYSIS, ROUTINE W REFLEX MICROSCOPIC - Abnormal; Notable for the following:       Result Value   APPearance HAZY (*)    All other components within normal limits  BASIC METABOLIC PANEL - Abnormal; Notable for the following:    Calcium 8.6 (*)    All other components within normal limits    URINE CULTURE  CBC    EKG  EKG Interpretation None       Radiology Ct Renal Stone Study  Result Date: 11/01/2016 CLINICAL DATA:  Left flank and left lower quadrant abdominal pain for 2 days. EXAM: CT ABDOMEN AND PELVIS WITHOUT CONTRAST TECHNIQUE: Multidetector CT imaging of the abdomen and pelvis was performed following the standard protocol without IV contrast. COMPARISON:  04/19/2016 FINDINGS: Lower chest: No acute abnormality. Hepatobiliary: No focal liver abnormality is seen. No gallstones, gallbladder wall thickening, or biliary dilatation. Pancreas: Unremarkable. No pancreatic ductal dilatation or surrounding inflammatory changes. Spleen: Normal in size without focal abnormality. Adrenals/Urinary Tract: Adrenal glands are unremarkable. Kidneys are normal, without renal calculi, focal lesion, or hydronephrosis. Bladder is unremarkable. Stomach/Bowel: Stomach is within normal limits. Appendix is normal. No evidence of bowel wall thickening, distention, or  inflammatory changes. Vascular/Lymphatic: No significant vascular findings are present. No enlarged abdominal or pelvic lymph nodes. Reproductive: Hysterectomy. 6 cm cyst in the left adnexal region. Resolution or significant reduction of the right-sided cyst observed on 04/19/2016. Other: No ascites. No acute inflammatory changes in the abdomen or pelvis. Musculoskeletal: No significant skeletal lesions. IMPRESSION: 1. 6 cm left adnexal cyst. 2. Resolved or reduced right adnexal cyst compared to 04/19/2016. 3. Pelvic sonography may be helpful for further characterization. 4. No acute inflammatory changes in the abdomen or pelvis. Electronically Signed   By: Ellery Plunk M.D.   On: 11/01/2016 03:28    Procedures Procedures (including critical care time)  Medications Ordered in ED Medications  ketorolac (TORADOL) injection 60 mg (60 mg Intramuscular Given 10/31/16 2252)     Initial Impression / Assessment and Plan / ED Course  I  have reviewed the triage vital signs and the nursing notes.  Pertinent labs & imaging results that were available during my care of the patient were reviewed by me and considered in my medical decision making (see chart for details).  Clinical Course     30 year old female presents to the emergency department for evaluation of left-sided abdominal and back pain. She reports urinary symptoms; however, urinalysis does not suggest infection. There is no evidence of hematuria. Kidney function is preserved. Patient also without fever or leukocytosis to suggest acute infection. Urine sent for culture. Patient has not had any nausea, vomiting, or bowel changes. CT was obtained given persistent pain to rule out kidney stone. There is no evidence of stone on imaging, though left adnexal cyst is seen. This is fairly large measuring 6cm. Question whether ovarian cyst versus musculoskeletal back pain is responsible for onset of symptoms. No red flags or signs concerning for cauda equina.  I do not believe further emergent workup is indicated. I have advised continued supportive care on an outpatient basis. Patient referred to an OB/GYN for follow-up. Return precautions discussed and provided. Patient discharged in stable condition with no unaddressed concerns.   Final Clinical Impressions(s) / ED Diagnoses   Final diagnoses:  Left ovarian cyst    New Prescriptions New Prescriptions   NAPROXEN (NAPROSYN) 500 MG TABLET    Take 1 tablet (500 mg total) by mouth 2 (two) times daily.   PHENAZOPYRIDINE (PYRIDIUM) 200 MG TABLET    Take 1 tablet (200 mg total) by mouth 3 (three) times daily.   TRAMADOL (ULTRAM) 50 MG TABLET    Take 1 tablet (50 mg total) by mouth every 6 (six) hours as needed.    I personally performed the services described in this documentation, which was scribed in my presence. The recorded information has been reviewed and is accurate.      Antony Madura, PA-C 11/01/16 0435    Shaune Pollack, MD 11/02/16 [phone]

## 2016-10-31 NOTE — ED Notes (Signed)
Pt is aware urine sample is needed. 

## 2016-10-31 NOTE — ED Notes (Signed)
Made two blood draw attempts on left arm.  Unable to draw any blood or even find a vein.

## 2016-11-01 ENCOUNTER — Emergency Department (HOSPITAL_COMMUNITY): Payer: Medicare Other

## 2016-11-01 DIAGNOSIS — N83202 Unspecified ovarian cyst, left side: Secondary | ICD-10-CM | POA: Diagnosis not present

## 2016-11-01 LAB — BASIC METABOLIC PANEL
Anion gap: 5 (ref 5–15)
BUN: 14 mg/dL (ref 6–20)
CHLORIDE: 108 mmol/L (ref 101–111)
CO2: 26 mmol/L (ref 22–32)
CREATININE: 0.68 mg/dL (ref 0.44–1.00)
Calcium: 8.6 mg/dL — ABNORMAL LOW (ref 8.9–10.3)
GFR calc non Af Amer: >60 mL/min (ref >60)
GLUCOSE: 91 mg/dL (ref 65–99)
Potassium: 3.6 mmol/L (ref 3.5–5.1)
Sodium: 139 mmol/L (ref 135–145)

## 2016-11-01 LAB — CBC
HCT: 38.9 % (ref 36.0–46.0)
Hemoglobin: 12.9 g/dL (ref 12.0–15.0)
MCH: 27.9 pg (ref 26.0–34.0)
MCHC: 33.2 g/dL (ref 30.0–36.0)
MCV: 84.2 fL (ref 78.0–100.0)
PLATELETS: 337 10*3/uL (ref 150–400)
RBC: 4.62 MIL/uL (ref 3.87–5.11)
RDW: 13.9 % (ref 11.5–15.5)
WBC: 9.5 10*3/uL (ref 4.0–10.5)

## 2016-11-01 MED ORDER — TRAMADOL HCL 50 MG PO TABS: 50.0000 mg | ORAL_TABLET | Freq: Four times a day (QID) | ORAL | 0 refills | Status: DC | PRN

## 2016-11-01 MED ORDER — NAPROXEN 500 MG PO TABS: 500.0000 mg | ORAL_TABLET | Freq: Two times a day (BID) | ORAL | 0 refills | Status: DC

## 2016-11-01 MED ORDER — PHENAZOPYRIDINE HCL 200 MG PO TABS: 200.0000 mg | ORAL_TABLET | Freq: Three times a day (TID) | ORAL | 0 refills | Status: DC

## 2016-11-01 NOTE — ED Notes (Signed)
Pt asked about pain medicine.  PA states that pt cannot drive home after being given narcotics and will require a ride from someone else.  Pt refuses any pain medicine.

## 2016-11-01 NOTE — Discharge Instructions (Signed)
Your blood work was reassuring and your urine did not show evidence of infection. Your CT showed a left ovarian cyst. This may be causing your pain. We advised a follow-up with an OB/GYN. [facility] for worsening symptoms.

## 2016-11-01 NOTE — ED Notes (Signed)
MD in room attempting ultrasound IV as well as blood draw.

## 2016-11-02 LAB — URINE CULTURE

## 2017-02-17 ENCOUNTER — Emergency Department (HOSPITAL_COMMUNITY)
Admission: EM | Admit: 2017-02-17 | Discharge: 2017-02-17 | Disposition: A | Discharge status: Home / Self Care | Payer: Medicare Other | Attending: Emergency Medicine | Admitting: Emergency Medicine

## 2017-02-17 ENCOUNTER — Encounter (HOSPITAL_COMMUNITY): Payer: Self-pay

## 2017-02-17 ENCOUNTER — Emergency Department (HOSPITAL_COMMUNITY): Payer: Medicare Other

## 2017-02-17 DIAGNOSIS — N3090 Cystitis, unspecified without hematuria: Secondary | ICD-10-CM

## 2017-02-17 DIAGNOSIS — R319 Hematuria, unspecified: Secondary | ICD-10-CM | POA: Diagnosis present

## 2017-02-17 DIAGNOSIS — N3091 Cystitis, unspecified with hematuria: Secondary | ICD-10-CM | POA: Diagnosis not present

## 2017-02-17 DIAGNOSIS — J45909 Unspecified asthma, uncomplicated: Secondary | ICD-10-CM | POA: Insufficient documentation

## 2017-02-17 LAB — URINALYSIS, ROUTINE W REFLEX MICROSCOPIC
BILIRUBIN URINE: NEGATIVE
GLUCOSE, UA: NEGATIVE mg/dL
KETONES UR: NEGATIVE mg/dL
NITRITE: NEGATIVE
PROTEIN: 100 mg/dL — AB
Specific Gravity, Urine: 1.017 (ref 1.005–1.030)
pH: 5 (ref 5.0–8.0)

## 2017-02-17 MED ORDER — PHENAZOPYRIDINE HCL 200 MG PO TABS: 200.0000 mg | ORAL_TABLET | Freq: Three times a day (TID) | ORAL | 0 refills | Status: DC

## 2017-02-17 MED ORDER — CEPHALEXIN 500 MG PO CAPS: 500.0000 mg | ORAL_CAPSULE | Freq: Two times a day (BID) | ORAL | 0 refills | Status: AC

## 2017-02-17 MED ORDER — PHENAZOPYRIDINE HCL 200 MG PO TABS
200.0000 mg | ORAL_TABLET | Freq: Once | ORAL | Status: AC
  Administered 2017-02-17: 200 mg via ORAL
  Filled 2017-02-17: qty 1

## 2017-02-17 MED ORDER — CEPHALEXIN 500 MG PO CAPS
500.0000 mg | ORAL_CAPSULE | Freq: Once | ORAL | Status: AC
  Administered 2017-02-17: 500 mg via ORAL
  Filled 2017-02-17: qty 1

## 2017-02-17 NOTE — ED Provider Notes (Signed)
[facility] Provider Note   CSN: [id number] Arrival date & time: 02/17/17  1708     History   Chief Complaint Chief Complaint  Patient presents with  . Hematuria    HPI Fonnie MuVictoria Pennock is a 31 y.o. female.  HPI Patient presents with concern of abdominal pain, flank pain, hematuria, dysuria. Symptoms began one week ago, initially with mild dysuria, hematuria. Over the interval, the patient has developed substantial pain, bilaterally, worse on the left flank. Pain is sore, severe, intermittent. There is persistent dysuria, as well as hematuria, but no vaginal bleeding, no vaginal discharge. Patient has a history of prior urinary tract infection, denies a history of kidney infection, kidney stone. There is associated nausea, but no fever, vomiting, no diarrhea. It is unclear if the patient has taken any medication for pain relief thus far.  Past Medical History:  Diagnosis Date  . Asthma   . Ovarian cyst   . Pseudotumor cerebri     There are no active problems to display for this patient.   Past Surgical History:  Procedure Laterality Date  . ABDOMINAL HYSTERECTOMY    . carpel  tunnel release    . CESAREAN SECTION    . peritoneal cyst      OB History    No data available       Home Medications    Prior to Admission medications   Medication Sig Start Date End Date Taking? Authorizing Provider  albuterol (PROVENTIL HFA;VENTOLIN HFA) 108 (90 Base) MCG/ACT inhaler Inhale 2 puffs into the lungs every 6 (six) hours as needed for wheezing or shortness of breath.    Historical Provider, MD  Diclofenac Sodium 3 % GEL Place 1 application onto the skin 2 (two) times daily. To affected area. Patient not taking: Reported on 10/31/2016 10/04/16   Everlene FarrierWilliam Dansie, PA-C  ibuprofen (ADVIL,MOTRIN) 200 MG tablet Take 400 mg by mouth every 6 (six) hours as needed for moderate pain.    Historical Provider, MD  ibuprofen (ADVIL,MOTRIN) 600 MG tablet Take 1 tablet (600 mg  total) by mouth every 6 (six) hours as needed for mild pain or moderate pain. Patient not taking: Reported on 10/31/2016 10/04/16   Everlene FarrierWilliam Dansie, PA-C  methocarbamol (ROBAXIN) 500 MG tablet Take 1 tablet (500 mg total) by mouth 2 (two) times daily as needed for muscle spasms. Patient not taking: Reported on 10/31/2016 10/04/16   Everlene FarrierWilliam Dansie, PA-C  naproxen (NAPROSYN) 500 MG tablet Take 1 tablet (500 mg total) by mouth 2 (two) times daily. 11/01/16   Antony MaduraKelly Humes, PA-C  phenazopyridine (PYRIDIUM) 200 MG tablet Take 1 tablet (200 mg total) by mouth 3 (three) times daily. 11/01/16   Antony MaduraKelly Humes, PA-C  traMADol (ULTRAM) 50 MG tablet Take 1 tablet (50 mg total) by mouth every 6 (six) hours as needed. 11/01/16   Antony MaduraKelly Humes, PA-C    Family History Family History  Problem Relation Age of Onset  . Hypertension Other   . Diabetes Other   . Cancer Other   . CAD Other   . Leukemia Other     Social History Social History  Substance Use Topics  . Smoking status: Never Smoker  . Smokeless tobacco: Never Used  . Alcohol use No     Allergies   Serevent [salmeterol]   Review of Systems Review of Systems  Constitutional:       Per HPI, otherwise negative  HENT:       Per HPI, otherwise negative  Respiratory:  Per HPI, otherwise negative  Cardiovascular:       Per HPI, otherwise negative  Gastrointestinal: Positive for nausea. Negative for vomiting.  Endocrine:       Negative aside from HPI  Genitourinary:       Neg aside from HPI   Musculoskeletal:       Per HPI, otherwise negative  Skin: Negative.   Neurological: Negative for syncope.     Physical Exam Updated Vital Signs BP 123/82 (BP Location: Left Arm)   Pulse 80   Temp 98.1 F (36.7 C) (Oral)   Resp 12   Ht 5\' 5"  (1.651 m)   Wt 297 lb 2 oz (134.8 kg)   SpO2 100%   BMI 49.44 kg/m   Physical Exam  Constitutional: She is oriented to person, place, and time. She appears well-developed and well-nourished. No  distress.  HENT:  Head: Normocephalic and atraumatic.  Eyes: Conjunctivae and EOM are normal.  Cardiovascular: Normal rate and regular rhythm.   Pulmonary/Chest: Effort normal and breath sounds normal. No stridor. No respiratory distress.  Abdominal: She exhibits no distension.    Musculoskeletal: She exhibits no edema.  Neurological: She is alert and oriented to person, place, and time. No cranial nerve deficit.  Skin: Skin is warm and dry.  Psychiatric: She has a normal mood and affect.  Nursing note and vitals reviewed.    ED Treatments / Results  Labs (all labs ordered are listed, but only abnormal results are displayed) Labs Reviewed  URINALYSIS, ROUTINE W REFLEX MICROSCOPIC - Abnormal; Notable for the following:       Result Value   APPearance CLOUDY (*)    Hgb urine dipstick MODERATE (*)    Protein, ur 100 (*)    Leukocytes, UA LARGE (*)    Bacteria, UA MANY (*)    Squamous Epithelial / LPF 0-5 (*)    Non Squamous Epithelial 0-5 (*)    All other components within normal limits    Radiology Ct Renal Stone Study  Result Date: 02/17/2017 CLINICAL DATA:  Left stat flank pain, increased urination for past week, blood in urine for 3 days EXAM: CT ABDOMEN AND PELVIS WITHOUT CONTRAST TECHNIQUE: Multidetector CT imaging of the abdomen and pelvis was performed following the standard protocol without IV contrast. COMPARISON:  11/01/2016 FINDINGS: Lower chest: Lung bases shows no acute findings Hepatobiliary: No focal liver abnormality is seen. No gallstones, gallbladder wall thickening, or biliary dilatation. Pancreas: Unremarkable. No pancreatic ductal dilatation or surrounding inflammatory changes. Spleen: Normal in size without focal abnormality. Adrenals/Urinary Tract: No adrenal gland mass. No nephrolithiasis. No hydronephrosis or hydroureter. No calcified ureteral calculi. The urinary bladder is under distended. There is mild thickening of urinary bladder wall. Cystitis cannot  be excluded. Clinical correlation is necessary. Stomach/Bowel: No small bowel obstruction. No thickened or dilated small bowel loops. Moderate to abundant stool noted within cecum. There is a low lying cecum. Normal appendix partially visualized in coronal image 61. Moderate stool noted within transverse colon. Moderate stool noted descending colon and sigmoid colon. No evidence of colitis or diverticulitis. Vascular/Lymphatic: No retroperitoneal adenopathy. No mesenteric adenopathy. No aortic aneurysm. Reproductive: The patient is status post hysterectomy. Other: No ascites or free abdominal air. Musculoskeletal: No destructive bony lesions are noted. Sagittal images of the spine shows minimal degenerative changes upper lumbar spine. IMPRESSION: 1. There is no evidence of nephrolithiasis. No hydronephrosis or hydroureter. No calcified ureteral calculi. 2. There is mild thickening of urinary bladder wall. Cystitis cannot be excluded.  Clinical correlation is necessary. 3. Moderate to abundant stool noted within cecum and right colon. Moderate stool noted throughout the colon. Chronic constipation cannot be excluded. 4. No small bowel obstruction. 5. Status post hysterectomy. 6. No pericecal inflammation.  Normal appendix. Electronically Signed   By: Natasha Mead M.D.   On: 02/17/2017 18:46    Procedures Procedures (including critical care time)  Medications Ordered in ED Medications  phenazopyridine (PYRIDIUM) tablet 200 mg (not administered)  cephALEXin (KEFLEX) capsule 500 mg (not administered)     Initial Impression / Assessment and Plan / ED Course  I have reviewed the triage vital signs and the nursing notes.  Pertinent labs & imaging results that were available during my care of the patient were reviewed by me and considered in my medical decision making (see chart for details).  On repeat exam the patient is awake and alert, in similar condition, no distress per She is hemodynamically  stable. With CT findings consistent with cystitis, no evidence for bacteremia, sepsis, no evidence for stone, obstruction, patient appropriate for initiation of antibiotics, outpatient follow-up.  Final Clinical Impressions(s) / ED Diagnoses  Cystitis   Gerhard Munch, MD 02/17/17 1900

## 2017-02-17 NOTE — ED Triage Notes (Signed)
Pt with increased urination for past week.  Noted blood in urine x 3 days.  Cloudy urine.  Pain in bladder.  No fever but states feels hot/cold.

## 2017-02-17 NOTE — Discharge Instructions (Signed)
As discussed, your evaluation today has been largely reassuring.  But, it is important that you monitor your condition carefully, and do not hesitate to return to the ED if you develop new, or concerning changes in your condition. ? ?Otherwise, please follow-up with your physician for appropriate ongoing care. ? ?

## 2017-05-16 ENCOUNTER — Ambulatory Visit (HOSPITAL_COMMUNITY)
Admission: EM | Admit: 2017-05-16 | Discharge: 2017-05-16 | Disposition: A | Discharge status: Home / Self Care | Payer: Medicare Other | Attending: Family Medicine | Admitting: Family Medicine

## 2017-05-16 ENCOUNTER — Encounter (HOSPITAL_COMMUNITY): Payer: Self-pay | Admitting: Family Medicine

## 2017-05-16 DIAGNOSIS — N760 Acute vaginitis: Secondary | ICD-10-CM | POA: Diagnosis not present

## 2017-05-16 MED ORDER — METRONIDAZOLE 500 MG PO TABS: 500.0000 mg | ORAL_TABLET | Freq: Two times a day (BID) | ORAL | 0 refills | Status: DC

## 2017-05-16 MED ORDER — TRIAMCINOLONE ACETONIDE 0.1 % EX CREA: 1.0000 "application " | TOPICAL_CREAM | Freq: Two times a day (BID) | CUTANEOUS | 1 refills | Status: DC

## 2017-05-16 MED ORDER — FLUCONAZOLE 200 MG PO TABS: ORAL_TABLET | ORAL | 0 refills | Status: DC

## 2017-05-16 NOTE — ED Provider Notes (Signed)
CSN: [id number]     Arrival date & time 05/16/17  1832 History   First MD Initiated Contact with Patient 05/16/17 1926     Chief Complaint  Patient presents with  . Vaginitis   (Consider location/radiation/quality/duration/timing/severity/associated sxs/prior Treatment) Laurie Fields is a 31 y.o. female with a past history of asthma, ovarian cyst, and pseudotumor cerebri, who presents to the [facility] with a chief complaint of vaginal discharge ongoing for 2 days. States this is a common problem for her happening nearly every summer, especially with changes in soaps. States she started a new soap this week, and her vaginal discharge started shortly thereafter. She states she is not sexually active, and "absolutely certain" this is not an STD. She has no pelvic pain, fever, swollen lymph nodes, nausea, vomiting, or other systemic complaints.   The history is provided by the patient.  Vaginal Discharge  Quality:  Malodorous and thick Severity:  Moderate Onset quality:  Gradual Duration:  2 days Timing:  Constant Progression:  Worsening Chronicity:  New Relieved by:  Nothing Worsened by:  Nothing Ineffective treatments:  None tried Associated symptoms: vaginal itching   Associated symptoms: no abdominal pain, no dysuria, no fever, no nausea, no rash, no urinary frequency, no urinary hesitancy and no vomiting   Risk factors: no STI, no STI exposure and no unprotected sex     Past Medical History:  Diagnosis Date  . Asthma   . Ovarian cyst   . Pseudotumor cerebri    Past Surgical History:  Procedure Laterality Date  . ABDOMINAL HYSTERECTOMY    . carpel  tunnel release    . CESAREAN SECTION    . peritoneal cyst     Family History  Problem Relation Age of Onset  . Hypertension Other   . Diabetes Other   . Cancer Other   . CAD Other   . Leukemia Other    Social History  Substance Use Topics  . Smoking status: Never Smoker  . Smokeless tobacco: Never  Used  . Alcohol use No   OB History    No data available     Review of Systems  Constitutional: Negative for chills and fever.  HENT: Negative.   Respiratory: Negative.   Cardiovascular: Negative.   Gastrointestinal: Negative for abdominal pain, diarrhea, nausea and vomiting.  Genitourinary: Positive for vaginal discharge. Negative for dysuria, frequency, hesitancy, vaginal bleeding and vaginal pain.  Musculoskeletal: Negative.   Skin: Negative.   Neurological: Negative.     Allergies  Serevent [salmeterol]  Home Medications   Prior to Admission medications   Medication Sig Start Date End Date Taking? Authorizing Provider  albuterol (PROVENTIL HFA;VENTOLIN HFA) 108 (90 Base) MCG/ACT inhaler Inhale 2 puffs into the lungs every 6 (six) hours as needed for wheezing or shortness of breath.    [provider]  Diclofenac Sodium 3 % GEL Place 1 application onto the skin 2 (two) times daily. To affected area. Patient not taking: Reported on 10/31/2016 10/04/16   Everlene Farrier, PA-C  fluconazole (DIFLUCAN) 200 MG tablet Take one tablet today, wait 3 days, take the second tablet 05/16/17   Dorena Bodo, NP  metroNIDAZOLE (FLAGYL) 500 MG tablet Take 1 tablet (500 mg total) by mouth 2 (two) times daily. 05/16/17   Dorena Bodo, NP  triamcinolone cream (KENALOG) 0.1 % Apply 1 application topically 2 (two) times daily. 05/16/17   Dorena Bodo, NP   Meds Ordered and Administered this Visit  Medications - No  data to display  BP 121/80   Pulse 63   Temp 98.6 F (37 C)   Resp 18   SpO2 100%  No data found.   Physical Exam  Constitutional: She is oriented to person, place, and time. She appears well-developed and well-nourished. No distress.  HENT:  Head: Normocephalic and atraumatic.  Right Ear: External ear normal.  Left Ear: External ear normal.  Eyes: Conjunctivae are normal.  Neck: Normal range of motion.  Genitourinary:  Genitourinary Comments: deferred   Neurological: She is alert and oriented to person, place, and time.  Skin: Skin is warm and dry. Capillary refill takes less than 2 seconds. No rash noted. She is not diaphoretic. No erythema.  Psychiatric: She has a normal mood and affect. Her behavior is normal.  Nursing note and vitals reviewed.   Urgent Care Course     Procedures (including critical care time)  Labs Review Labs Reviewed - No data to display  Imaging Review No results found.    MDM   1. Acute vaginitis     Laurie Fields is a 31 y.o. female with a past history of asthma, ovarian cyst, and pseudotumor cerebri, who [facility] with a chief complaint of vaginal discharge ongoing for 2 days. States this is a common problem for her happening nearly every summer, especially with changes in soaps. States she started a new soap this week, and her vaginal discharge started shortly thereafter. She states she is not sexually active, and "absolutely certain" this is not an STD. She has no pelvic pain, fever, swollen lymph nodes, nausea, vomiting, or other systemic complaints.   Based on signs, symptoms, history, treating empirically with metronidazole, and Diflucan. Counseling provided along with follow-up guidelines. Testing declined.     Dorena BodoKennard, Ruth Kovich, NP 05/16/17 1942

## 2017-05-16 NOTE — ED Triage Notes (Signed)
Pt sts that she changes soaps and now she thinks she has a bacterial infection. sts some burning and she did the whiff test at home. sts that she is not sexually active.

## 2017-05-16 NOTE — Discharge Instructions (Signed)
You have been given metronidazole, diflucan, and Kenalog for your spider bite. Use these medicines as directed, if he seen redness or swelling at the site of the bite, or red streaking up your arm, go to the ER. Avoid any alcohol while you're on metronidazole as it can make you very ill.

## 2017-07-03 ENCOUNTER — Emergency Department (HOSPITAL_COMMUNITY)
Admission: EM | Admit: 2017-07-03 | Discharge: 2017-07-03 | Discharge status: Against Medical Advice | Payer: Medicare Other | Attending: Emergency Medicine | Admitting: Emergency Medicine

## 2017-07-03 DIAGNOSIS — Z5321 Procedure and treatment not carried out due to patient leaving prior to being seen by health care provider: Secondary | ICD-10-CM | POA: Insufficient documentation

## 2017-07-03 DIAGNOSIS — H9193 Unspecified hearing loss, bilateral: Secondary | ICD-10-CM | POA: Diagnosis present

## 2017-07-03 NOTE — ED Notes (Signed)
Call patient to go to a room x 3 and no answer.

## 2017-07-03 NOTE — ED Triage Notes (Addendum)
Patient states that she is having hearing in both ears. Patient states it feels like a lot of fluid flushing through her ears. She says that is hard to hear people talk to her but she can hear herself talk. Patient has not had any injury to her ear.

## 2017-07-08 ENCOUNTER — Encounter (HOSPITAL_COMMUNITY): Payer: Self-pay | Admitting: Emergency Medicine

## 2017-07-08 ENCOUNTER — Ambulatory Visit (HOSPITAL_COMMUNITY)
Admission: EM | Admit: 2017-07-08 | Discharge: 2017-07-08 | Disposition: A | Discharge status: Home / Self Care | Payer: Medicare Other | Attending: Emergency Medicine | Admitting: Emergency Medicine

## 2017-07-08 DIAGNOSIS — H9202 Otalgia, left ear: Secondary | ICD-10-CM | POA: Diagnosis not present

## 2017-07-08 DIAGNOSIS — H6123 Impacted cerumen, bilateral: Secondary | ICD-10-CM | POA: Diagnosis not present

## 2017-07-08 NOTE — ED Triage Notes (Signed)
[facility] a [facility] of left ear pain and decreased ear pain x several months.

## 2017-07-08 NOTE — ED Provider Notes (Signed)
[facility]    CSN: [id number] Arrival date & time: 07/08/17  1734     History   Chief Complaint Chief Complaint  Patient presents with  . Otalgia    HPI Valena Ivanov is a 31 y.o. female.   31 year old female states she has had decreased hearing in the left ear off and on for 2 months. It is become worse in the past 2 weeks and now she states she is unable to hear out of the left ear. Occasionally she has ringing in the ears and also tenderness behind the left angle of the jaw. She states the right ear is developing some some of the same symptoms intermittently. Denies pain, fever or chills. She states she does have a history of allergies.      Past Medical History:  Diagnosis Date  . Asthma   . Ovarian cyst   . Pseudotumor cerebri     There are no active problems to display for this patient.   Past Surgical History:  Procedure Laterality Date  . ABDOMINAL HYSTERECTOMY    . carpel  tunnel release    . CESAREAN SECTION    . peritoneal cyst      OB History    No data available       Home Medications    Prior to Admission medications   Medication Sig Start Date End Date Taking? Authorizing Provider  albuterol (PROVENTIL HFA;VENTOLIN HFA) 108 (90 Base) MCG/ACT inhaler Inhale 2 puffs into the lungs every 6 (six) hours as needed for wheezing or shortness of breath.   Yes [provider]    Family History Family History  Problem Relation Age of Onset  . Hypertension Other   . Diabetes Other   . Cancer Other   . CAD Other   . Leukemia Other     Social History Social History  Substance Use Topics  . Smoking status: Never Smoker  . Smokeless tobacco: Never Used  . Alcohol use No     Allergies   Serevent [salmeterol]   Review of Systems Review of Systems  Constitutional: Negative.  Negative for fever.  HENT: Positive for congestion and hearing loss. Negative for sore throat.   Respiratory: Negative.   Neurological:  Negative.   All other systems reviewed and are negative.    Physical Exam Triage Vital Signs ED Triage Vitals  Enc Vitals Group     BP 07/08/17 1824 (!) 136/92     Pulse Rate 07/08/17 1824 66     Resp 07/08/17 1824 16     Temp 07/08/17 1824 98.6 F (37 C)     Temp Source 07/08/17 1824 Oral     SpO2 07/08/17 1824 100 %     Weight --      Height --      Head Circumference --      Peak Flow --      Pain Score 07/08/17 1827 4     Pain Loc --      Pain Edu? --      Excl. in GC? --    No data found.   Updated Vital Signs BP (!) 136/92 (BP Location: Right Wrist)   Pulse 66   Temp 98.6 F (37 C) (Oral)   Resp 16   SpO2 100%   Visual Acuity Right Eye Distance:   Left Eye Distance:   Bilateral Distance:    Right Eye Near:   Left Eye Near:    Bilateral Near:  Physical Exam  Constitutional: She is oriented to person, place, and time. She appears well-developed and well-nourished. No distress.  HENT:  Bilateral EACs are occluded with wax and unable to visualize TMs. Oropharynx with minor posterior erythema. No swelling or exudate  Neck: Normal range of motion. Neck supple.  Cardiovascular: Normal rate.   Pulmonary/Chest: Effort normal and breath sounds normal.  Musculoskeletal: She exhibits no edema.  Lymphadenopathy:    She has no cervical adenopathy.  Neurological: She is alert and oriented to person, place, and time.  Skin: Skin is warm and dry.  Nursing note and vitals reviewed.    UC Treatments / Results  Labs (all labs ordered are listed, but only abnormal results are displayed) Labs Reviewed - No data to display  EKG  EKG Interpretation None       Radiology No results found.  Procedures Procedures (including critical care time)  Medications Ordered in UC Medications - No data to display   Initial Impression / Assessment and Plan / UC Course  I have reviewed the triage vital signs and the nursing notes.  Pertinent labs & imaging  results that were available during my care of the patient were reviewed by me and considered in my medical decision making (see chart for details). Post irrigation the patient states she can hear now. She still has some discomfort after irrigation. TMs with minimal erythema but no other signs of infection or effusion.    Clean ears and shower as directed. Soreness in your ears after irrigation will get better in a couple days. It is bothersome you may take Tylenol or ibuprofen. For drainage recommend taking Allegra or Zyrtec daily. The sure to drink plenty of water to clear your throat and stay well-hydrated.   Final Clinical Impressions(s) / UC Diagnoses   Final diagnoses:  Bilateral impacted cerumen  Left ear pain    New Prescriptions New Prescriptions   No medications on file     Controlled Substance Prescriptions [place] Controlled Substance Registry consulted? N/A   Hayden RasmussenMabe, Lenzi Marmo, NP 07/08/17 [phone]

## 2017-07-08 NOTE — Discharge Instructions (Signed)
Clean ears and shower as directed. Soreness in your ears after irrigation will get better in a couple days. It is bothersome you may take Tylenol or ibuprofen. For drainage recommend taking Allegra or Zyrtec daily. The sure to drink plenty of water to clear your throat and stay well-hydrated.

## 2017-09-14 ENCOUNTER — Encounter (HOSPITAL_COMMUNITY): Payer: Self-pay | Admitting: Emergency Medicine

## 2017-09-14 ENCOUNTER — Ambulatory Visit (HOSPITAL_COMMUNITY)
Admission: EM | Admit: 2017-09-14 | Discharge: 2017-09-14 | Disposition: A | Discharge status: Home / Self Care | Payer: Medicare Other | Attending: Family Medicine | Admitting: Family Medicine

## 2017-09-14 DIAGNOSIS — J111 Influenza due to unidentified influenza virus with other respiratory manifestations: Secondary | ICD-10-CM

## 2017-09-14 DIAGNOSIS — B9689 Other specified bacterial agents as the cause of diseases classified elsewhere: Secondary | ICD-10-CM

## 2017-09-14 DIAGNOSIS — R69 Illness, unspecified: Principal | ICD-10-CM

## 2017-09-14 DIAGNOSIS — N760 Acute vaginitis: Secondary | ICD-10-CM

## 2017-09-14 MED ORDER — METRONIDAZOLE 0.75 % VA GEL: 1.0000 | Freq: Two times a day (BID) | VAGINAL | 0 refills | Status: DC

## 2017-09-14 NOTE — ED Triage Notes (Signed)
Onset of symptoms yesterday.  Children reportedly have the flu.  Patient is fatigued and general body aches.  Reports sneezing, slight throat soreness.

## 2017-09-14 NOTE — ED Provider Notes (Signed)
  [facility]   [id number] 09/14/17 Arrival Time: 1247  ASSESSMENT & PLAN:  1. Influenza-like illness   2. BV (bacterial vaginosis)     Meds ordered this encounter  Medications  . metroNIDAZOLE (METROGEL) 0.75 % vaginal gel    Sig: Place 1 Applicatorful vaginally 2 (two) times daily.    Dispense:  70 g    Refill:  0    OTC symptom care as needed for flu-like illness. Work note given. May f/u as needed.  Reviewed expectations re: course of current medical issues. Questions answered. Outlined signs and symptoms indicating need for more acute intervention. Patient verbalized understanding. After Visit Summary given.   SUBJECTIVE:  Fonnie MuVictoria Brugger is a 31 y.o. female who presents with complaint of nasal congestion, post-nasal drainage, and a persistent cough. Onset abrupt approximately 1 day ago. Overall fatigued. SOB: none. Wheezing: none. OTC treatment: Tylenol with mild help. No n/v.  Also has long history of recurrent BV. Followed regularly by Gyn. Currently with same symptoms and requests a trial Rx of Metrogel. Has not used before. Not sexually active. No LMP recorded. Patient has had a hysterectomy.  ROS: As per HPI.   OBJECTIVE:  Vitals:   09/14/17 1346  BP: 124/78  Pulse: 68  Resp: 18  Temp: 98.4 F (36.9 C)  TempSrc: Oral  SpO2: 100%    General appearance: alert; no distress HEENT: nasal congestion; clear runny nose; throat irritation secondary to post-nasal drainage Neck: supple without LAD Lungs: clear to auscultation bilaterally; dry cough Skin: warm and dry Psychological: alert and cooperative; normal mood and affect  Labs Reviewed - No data to display  No results found.  Allergies  Allergen Reactions  . Serevent [Salmeterol] Nausea And Vomiting and Other (See Comments)    Hallucinations    Past Medical History:  Diagnosis Date  . Asthma   . Ovarian cyst   . Pseudotumor cerebri    Social History   Social History  .  Marital status: Single    Spouse name: N/A  . Number of children: N/A  . Years of education: N/A   Occupational History  . Not on file.   Social History Main Topics  . Smoking status: Never Smoker  . Smokeless tobacco: Never Used  . Alcohol use No  . Drug use: No  . Sexual activity: Not on file   Other Topics Concern  . Not on file   Social History Narrative  . No narrative on file   Family History  Problem Relation Age of Onset  . Hypertension Other   . Diabetes Other   . Cancer Other   . CAD Other   . Leukemia Other            Mardella LaymanHagler, Ajani Schnieders, MD 09/14/17 [phone]

## 2017-09-23 ENCOUNTER — Encounter (HOSPITAL_COMMUNITY): Payer: Self-pay | Admitting: Emergency Medicine

## 2017-09-23 ENCOUNTER — Ambulatory Visit (HOSPITAL_COMMUNITY)
Admission: EM | Admit: 2017-09-23 | Discharge: 2017-09-23 | Disposition: A | Discharge status: Home / Self Care | Payer: Medicare Other | Attending: Emergency Medicine | Admitting: Emergency Medicine

## 2017-09-23 DIAGNOSIS — J4521 Mild intermittent asthma with (acute) exacerbation: Secondary | ICD-10-CM

## 2017-09-23 MED ORDER — DEXAMETHASONE 4 MG PO TABS: 12.0000 mg | ORAL_TABLET | Freq: Once | ORAL | 0 refills | Status: AC

## 2017-09-23 MED ORDER — DEXAMETHASONE 10 MG/ML FOR PEDIATRIC ORAL USE
INTRAMUSCULAR | Status: AC
Start: 2017-09-23 — End: ?
  Filled 2017-09-23: qty 1

## 2017-09-23 MED ORDER — DEXAMETHASONE 1 MG/ML PO CONC
10.0000 mg | Freq: Once | ORAL | Status: AC
  Administered 2017-09-23: 10 mg via ORAL

## 2017-09-23 NOTE — ED Triage Notes (Signed)
Pt here for URI sx with asthma and cough; pt seen here several days ago for same

## 2017-09-24 NOTE — ED Provider Notes (Signed)
[facility]    CSN: [id number] Arrival date & time: 09/23/17  1945     History   Chief Complaint Chief Complaint  Patient presents with  . Asthma  . Cough    HPI Laurie Fields is a 31 y.o. female.   31 year-old female, with history of asthma, presenting today due to cough. She states that she was seen here two days ago and was treated for flu symptoms. States that since that time, she has had asthma exacerbation. She has had some mild chest tightness, shortness of breath and cough. She denies fever, chills, headache, neck pain or stiffness.    The history is provided by the patient.  Asthma  This is a recurrent problem. The current episode started more than 2 days ago. The problem occurs constantly. The problem has not changed since onset.Associated symptoms include shortness of breath. Pertinent negatives include no chest pain and no abdominal pain. Nothing aggravates the symptoms. Relieved by: inhaler, nebulizers  Treatments tried: albuterol, nebulizers  The treatment provided moderate relief.    Past Medical History:  Diagnosis Date  . Asthma   . Ovarian cyst   . Pseudotumor cerebri     There are no active problems to display for this patient.   Past Surgical History:  Procedure Laterality Date  . ABDOMINAL HYSTERECTOMY    . carpel  tunnel release    . CESAREAN SECTION    . peritoneal cyst      OB History    No data available       Home Medications    Prior to Admission medications   Medication Sig Start Date End Date Taking? Authorizing Provider  albuterol (PROVENTIL HFA;VENTOLIN HFA) 108 (90 Base) MCG/ACT inhaler Inhale 2 puffs into the lungs every 6 (six) hours as needed for wheezing or shortness of breath.    [provider]  metroNIDAZOLE (METROGEL) 0.75 % vaginal gel Place 1 Applicatorful vaginally 2 (two) times daily. 09/14/17   Mardella LaymanHagler, Brian, MD    Family History Family History  Problem Relation Age of Onset  .  Hypertension Other   . Diabetes Other   . Cancer Other   . CAD Other   . Leukemia Other     Social History Social History  Substance Use Topics  . Smoking status: Never Smoker  . Smokeless tobacco: Never Used  . Alcohol use No     Allergies   Serevent [salmeterol]   Review of Systems Review of Systems  Constitutional: Negative for chills and fever.  HENT: Negative for ear pain and sore throat.   Eyes: Negative for pain and visual disturbance.  Respiratory: Positive for cough and shortness of breath.   Cardiovascular: Negative for chest pain and palpitations.  Gastrointestinal: Negative for abdominal pain and vomiting.  Genitourinary: Negative for dysuria and hematuria.  Musculoskeletal: Negative for arthralgias and back pain.  Skin: Negative for color change and rash.  Neurological: Negative for seizures and syncope.  All other systems reviewed and are negative.    Physical Exam Triage Vital Signs ED Triage Vitals  Enc Vitals Group     BP 09/23/17 1952 (!) 147/89     Pulse Rate 09/23/17 1952 77     Resp 09/23/17 1952 18     Temp 09/23/17 1952 98.3 F (36.8 C)     Temp Source 09/23/17 1952 Oral     SpO2 09/23/17 1952 100 %     Weight 09/23/17 1953 290 lb (131.5 kg)  Height --      Head Circumference --      Peak Flow --      Pain Score 09/23/17 1953 8     Pain Loc --      Pain Edu? --      Excl. in GC? --    No data found.   Updated Vital Signs BP (!) 147/89 (BP Location: Right Arm)   Pulse 77   Temp 98.3 F (36.8 C) (Oral)   Resp 18   Wt 290 lb (131.5 kg)   SpO2 100%   BMI 48.26 kg/m   Visual Acuity Right Eye Distance:   Left Eye Distance:   Bilateral Distance:    Right Eye Near:   Left Eye Near:    Bilateral Near:     Physical Exam  Constitutional: She is oriented to person, place, and time. She appears well-developed and well-nourished. No distress.  HENT:  Head: Normocephalic and atraumatic.  Eyes: Pupils are equal, round, and  reactive to light. Conjunctivae and EOM are normal.  Neck: Normal range of motion. Neck supple.  Cardiovascular: Normal rate and regular rhythm.   No murmur heard. Pulmonary/Chest: Effort normal. No respiratory distress. She has decreased breath sounds. She has no wheezes. She has no rhonchi. She has no rales.  Mild decreased breath sounds bilaterally.   Abdominal: Soft. There is no tenderness.  Musculoskeletal: Normal range of motion. She exhibits no edema.  Neurological: She is alert and oriented to person, place, and time.  Skin: Skin is warm and dry. She is not diaphoretic.  Psychiatric: She has a normal mood and affect.  Nursing note and vitals reviewed.    UC Treatments / Results  Labs (all labs ordered are listed, but only abnormal results are displayed) Labs Reviewed - No data to display  EKG  EKG Interpretation None       Radiology No results found.  Procedures Procedures (including critical care time)  Medications Ordered in UC Medications  dexamethasone (DECADRON) 1 MG/ML solution 10 mg (10 mg Oral Given 09/23/17 2108)     Initial Impression / Assessment and Plan / UC Course  I have reviewed the triage vital signs and the nursing notes.  Pertinent labs & imaging results that were available during my care of the patient were reviewed by me and considered in my medical decision making (see chart for details).     Asthma and cough. Patient recently treated for flu symptoms. She is now complaining of asthmas exacerbation. She does mildly decreased breath sounds on exam. She is requesting steroids - will treat with oral decadron and send home with the same. Recommended follow-up with PCP - she was given return precautions  Final Clinical Impressions(s) / UC Diagnoses   Final diagnoses:  Mild intermittent asthma with exacerbation    New Prescriptions Discharge Medication List as of 09/23/2017  9:03 PM    START taking these medications   Details    dexamethasone (DECADRON) 4 MG tablet Take 3 tablets (12 mg total) by mouth once., Starting Mon 09/23/2017, Normal         Controlled Substance Prescriptions [place] Controlled Substance Registry consulted? Not Applicable   Alecia Lemming, [place] 09/24/17 1610

## 2017-09-25 ENCOUNTER — Encounter (HOSPITAL_COMMUNITY): Payer: Self-pay

## 2017-09-25 DIAGNOSIS — J45909 Unspecified asthma, uncomplicated: Secondary | ICD-10-CM | POA: Diagnosis not present

## 2017-09-25 DIAGNOSIS — R079 Chest pain, unspecified: Secondary | ICD-10-CM | POA: Insufficient documentation

## 2017-09-25 DIAGNOSIS — R42 Dizziness and giddiness: Secondary | ICD-10-CM | POA: Diagnosis not present

## 2017-09-25 DIAGNOSIS — Z79899 Other long term (current) drug therapy: Secondary | ICD-10-CM | POA: Diagnosis not present

## 2017-09-25 LAB — CBC
HEMATOCRIT: 41.8 % (ref 36.0–46.0)
Hemoglobin: 13.7 g/dL (ref 12.0–15.0)
MCH: 28.5 pg (ref 26.0–34.0)
MCHC: 32.8 g/dL (ref 30.0–36.0)
MCV: 86.9 fL (ref 78.0–100.0)
Platelets: 380 10*3/uL (ref 150–400)
RBC: 4.81 MIL/uL (ref 3.87–5.11)
RDW: 14.3 % (ref 11.5–15.5)
WBC: 11 10*3/uL — ABNORMAL HIGH (ref 4.0–10.5)

## 2017-09-25 LAB — BASIC METABOLIC PANEL
ANION GAP: 9 (ref 5–15)
BUN: 20 mg/dL (ref 6–20)
CHLORIDE: 107 mmol/L (ref 101–111)
CO2: 24 mmol/L (ref 22–32)
Calcium: 8.8 mg/dL — ABNORMAL LOW (ref 8.9–10.3)
Creatinine, Ser: 0.79 mg/dL (ref 0.44–1.00)
GFR calc non Af Amer: >60 mL/min (ref >60)
GLUCOSE: 96 mg/dL (ref 65–99)
Potassium: 3.7 mmol/L (ref 3.5–5.1)
Sodium: 140 mmol/L (ref 135–145)

## 2017-09-25 LAB — CBG MONITORING, ED: Glucose-Capillary: 86 mg/dL (ref 65–99)

## 2017-09-25 NOTE — ED Triage Notes (Signed)
Pt complains of being dizzy and chest pressure since last night, she states that she passed out twice today Pt was seen at urgent Care two days ago and was told it was her asthma

## 2017-09-26 ENCOUNTER — Emergency Department (HOSPITAL_COMMUNITY): Payer: Medicare Other

## 2017-09-26 ENCOUNTER — Emergency Department (HOSPITAL_COMMUNITY)
Admission: EM | Admit: 2017-09-26 | Discharge: 2017-09-26 | Disposition: A | Discharge status: Home / Self Care | Payer: Medicare Other | Attending: Emergency Medicine | Admitting: Emergency Medicine

## 2017-09-26 DIAGNOSIS — R42 Dizziness and giddiness: Secondary | ICD-10-CM

## 2017-09-26 DIAGNOSIS — R079 Chest pain, unspecified: Secondary | ICD-10-CM

## 2017-09-26 LAB — URINALYSIS, ROUTINE W REFLEX MICROSCOPIC
Bilirubin Urine: NEGATIVE
GLUCOSE, UA: NEGATIVE mg/dL
Hgb urine dipstick: NEGATIVE
Ketones, ur: NEGATIVE mg/dL
LEUKOCYTES UA: NEGATIVE
Nitrite: NEGATIVE
PH: 5 (ref 5.0–8.0)
Protein, ur: NEGATIVE mg/dL
Specific Gravity, Urine: 1.032 — ABNORMAL HIGH (ref 1.005–1.030)

## 2017-09-26 LAB — D-DIMER, QUANTITATIVE: D-Dimer, Quant: 0.53 ug/mL-FEU — ABNORMAL HIGH (ref 0.00–0.50)

## 2017-09-26 MED ORDER — MECLIZINE HCL 25 MG PO TABS
25.0000 mg | ORAL_TABLET | Freq: Once | ORAL | Status: AC
  Administered 2017-09-26: 25 mg via ORAL
  Filled 2017-09-26: qty 1

## 2017-09-26 MED ORDER — ONDANSETRON 8 MG PO TBDP: 8.0000 mg | ORAL_TABLET | Freq: Three times a day (TID) | ORAL | 0 refills | Status: DC | PRN

## 2017-09-26 MED ORDER — MECLIZINE HCL 25 MG PO TABS: 25.0000 mg | ORAL_TABLET | Freq: Three times a day (TID) | ORAL | 0 refills | Status: DC | PRN

## 2017-09-26 MED ORDER — ONDANSETRON 8 MG PO TBDP
8.0000 mg | ORAL_TABLET | Freq: Once | ORAL | Status: AC
  Administered 2017-09-26: 8 mg via ORAL
  Filled 2017-09-26: qty 1

## 2017-09-26 MED ORDER — MECLIZINE HCL 25 MG PO TABS
25.0000 mg | ORAL_TABLET | Freq: Once | ORAL | Status: AC
Start: 2017-09-26 — End: 2017-09-26
  Administered 2017-09-26: 25 mg via ORAL
  Filled 2017-09-26: qty 1

## 2017-09-26 MED ORDER — PROCHLORPERAZINE EDISYLATE 5 MG/ML IJ SOLN
10.0000 mg | Freq: Four times a day (QID) | INTRAMUSCULAR | Status: DC | PRN
  Administered 2017-09-26: 10 mg via INTRAMUSCULAR
  Filled 2017-09-26: qty 2

## 2017-09-26 MED ORDER — LORAZEPAM 1 MG PO TABS
1.0000 mg | ORAL_TABLET | Freq: Once | ORAL | Status: AC
  Administered 2017-09-26: 1 mg via ORAL
  Filled 2017-09-26: qty 1

## 2017-09-26 NOTE — ED Provider Notes (Signed)
11:23 AM Pt feels better at this time. Likely complicated migraine. Head CT negative. Vitals stable. Doubt PE. Nonspecific d dimer.  Clinically I do not believe this is relevant   Azalia Bilisampos, Henessy Rohrer, MD 09/26/17 1124

## 2017-09-26 NOTE — ED Notes (Signed)
Bed: WLPT3 Expected date:  Expected time:  Means of arrival:  Comments: 

## 2017-09-26 NOTE — ED Provider Notes (Signed)
[facility]EMERGENCY DEPT Provider Note   CSN: [id number] Arrival date & time: 09/25/17  2059  Time seen 04:16 AM   History   Chief Complaint Chief Complaint  Patient presents with  . Dizziness    HPI Laurie Brzozowski is a 31 y.o. female.  HPI patient reports the evening of October 31 she started having acute onset of feeling dizzy while she was laying down.  She states she also feels lightheaded and the room is spinning.  Any movement of her head makes it feel worse.  She also complains of a headache in the back part of her head is described as pressure that she has had before with her pseudotumor cerebri.  She states the last time she had a LP was about a year ago.  She has had nausea without vomiting but denies fever.  She started having some blurred vision at this morning.  She denies any numbness or tingling of her extremities.  She states she has had chest pain for 1 week.  She points to her left lower chest medially and states it is intermittent but is worse at night.  It makes her feel short of breath.  She denies being on any type of hormone replacement or traveling recently.  She denies pain or swelling of her legs.  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  She states she has been off blood thinners for several years now.  Her sister also has had a history of DVT in her leg.  PCP .none  Past Medical History:  Diagnosis Date  . Asthma   . Ovarian cyst   . Pseudotumor cerebri     There are no active problems to display for this patient.   Past Surgical History:  Procedure Laterality Date  . ABDOMINAL HYSTERECTOMY    . carpel  tunnel release    . CESAREAN SECTION    . peritoneal cyst      OB History    No data available       Home Medications    Prior to Admission medications   Medication Sig Start Date End Date Taking?  Authorizing Provider  albuterol (PROVENTIL HFA;VENTOLIN HFA) 108 (90 Base) MCG/ACT inhaler Inhale 2 puffs into the lungs every 6 (six) hours as needed for wheezing or shortness of breath.   Yes [provider]  dimenhyDRINATE (DRAMAMINE) 50 MG tablet Take 100 mg by mouth every 6 (six) hours as needed for dizziness.   Yes [provider]  metroNIDAZOLE (FLAGYL) 500 MG tablet Take 500 mg by mouth 2 (two) times daily. 09/20/17  Yes [provider]  metroNIDAZOLE (METROGEL) 0.75 % vaginal gel Place 1 Applicatorful vaginally 2 (two) times daily. 09/14/17  Yes Mardella Layman, MD    Family History Family History  Problem Relation Age of Onset  . Hypertension Other   . Diabetes Other   . Cancer Other   . CAD Other   . Leukemia Other     Social History Social History  Substance Use Topics  . Smoking status: Never Smoker  . Smokeless tobacco: Never Used  . Alcohol use No  just moved here from [facility]   Allergies   Serevent [salmeterol]   Review of Systems Review of Systems  All other systems reviewed and are negative.    Physical Exam Updated Vital Signs There were  no vitals taken for this visit.    Physical Exam  Constitutional: She is oriented to person, place, and time. She appears well-developed and well-nourished.  Non-toxic appearance. She does not appear ill. No distress.  HENT:  Head: Normocephalic and atraumatic.  Right Ear: External ear normal.  Left Ear: External ear normal.  Nose: Nose normal. No mucosal edema or rhinorrhea.  Mouth/Throat: Oropharynx is clear and moist and mucous membranes are normal. No dental abscesses or uvula swelling.  Eyes: Pupils are equal, round, and reactive to light. Conjunctivae are normal. Right eye exhibits nystagmus. Left eye exhibits nystagmus.  Neck: Normal range of motion and full passive range of motion without pain. Neck supple.  Cardiovascular: Normal rate, regular rhythm and normal heart sounds.  Exam  reveals no gallop and no friction rub.   No murmur heard. Pulmonary/Chest: Effort normal and breath sounds normal. No respiratory distress. She has no wheezes. She has no rhonchi. She has no rales. She exhibits no tenderness and no crepitus.    Area of chest pain noted.   Abdominal: Soft. Normal appearance and bowel sounds are normal. She exhibits no distension. There is no tenderness. There is no rebound and no guarding.  Musculoskeletal: Normal range of motion. She exhibits no edema or tenderness.  Moves all extremities well.   Neurological: She is alert and oriented to person, place, and time. She has normal strength. No cranial nerve deficit.  Skin: Skin is warm, dry and intact. No rash noted. No erythema. No pallor.  Psychiatric: She has a normal mood and affect. Her speech is normal and behavior is normal. Her mood appears not anxious.  Nursing note and vitals reviewed.    ED Treatments / Results  Labs (all labs ordered are listed, but only abnormal results are displayed) Results for orders placed or performed during the hospital encounter of 09/26/17  Basic metabolic panel  Result Value Ref Range   Sodium 140 135 - 145 mmol/L   Potassium 3.7 3.5 - 5.1 mmol/L   Chloride 107 101 - 111 mmol/L   CO2 24 22 - 32 mmol/L   Glucose, Bld 96 65 - 99 mg/dL   BUN 20 6 - 20 mg/dL   Creatinine, Ser 2.950.79 0.44 - 1.00 mg/dL   Calcium 8.8 (L) 8.9 - 10.3 mg/dL   GFR calc non Af Amer >60 >60 mL/min   GFR calc Af Amer >60 >60 mL/min   Anion gap 9 5 - 15  CBC  Result Value Ref Range   WBC 11.0 (H) 4.0 - 10.5 K/uL   RBC 4.81 3.87 - 5.11 MIL/uL   Hemoglobin 13.7 12.0 - 15.0 g/dL   HCT 62.141.8 30.836.0 - 65.746.0 %   MCV 86.9 78.0 - 100.0 fL   MCH 28.5 26.0 - 34.0 pg   MCHC 32.8 30.0 - 36.0 g/dL   RDW 84.614.3 96.211.5 - 95.215.5 %   Platelets 380 150 - 400 K/uL  CBG monitoring, ED  Result Value Ref Range   Glucose-Capillary 86 65 - 99 mg/dL   Laboratory interpretation all normal except for leukocytosis,  pending ddimer    EKG  EKG Interpretation  Date/Time:  Wednesday September 25 2017 23:13:42 EDT Ventricular Rate:  89 PR Interval:    QRS Duration: 96 QT Interval:  360 QTC Calculation: 438 R Axis:   67 Text Interpretation:  Sinus rhythm Borderline short PR interval Otherwise within normal limits No old tracing to compare Confirmed by Devoria AlbeKnapp, Akaisha Truman ([id number]) on 09/26/2017 12:06:55 AM Also  confirmed by Devoria Albe ([id number]), editor Madalyn Rob [phone])  on 09/26/2017 7:15:36 AM       Radiology No results found.  Procedures Procedures (including critical care time)  Medications Ordered in ED Medications  LORazepam (ATIVAN) tablet 1 mg (not administered)  meclizine (ANTIVERT) tablet 25 mg (not administered)  ondansetron (ZOFRAN-ODT) disintegrating tablet 8 mg (8 mg Oral Given 09/26/17 0502)  meclizine (ANTIVERT) tablet 25 mg (25 mg Oral Given 09/26/17 0501)     Initial Impression / Assessment and Plan / ED Course  I have reviewed the triage vital signs and the nursing notes.  Pertinent labs & imaging results that were available during my care of the patient were reviewed by me and considered in my medical decision making (see chart for details).    Patient was given Zofran ODT and meclizine for her dizziness.  D-dimer was added to her blood work.  6:45 AM nursing staff states they have been unable to draw her d-dimer.  Patient states she is always been a hard stick, sometimes have to use her foot.  Nursing staff is doing that now.  Patient unfortunately also states her dizziness is not improved. She was given more meclizine and oral ativan.   07:40 AM patient turned over to Dr Patria Mane to get her DDimer and see if her dizziness is improved.    Final Clinical Impressions(s) / ED Diagnoses   Final diagnoses:  Dizziness  Vertigo  Chest pain, unspecified type    Disposition pending  Devoria Albe, MD, Concha Pyo, MD 09/26/17 [phone]

## 2017-10-01 ENCOUNTER — Encounter (HOSPITAL_COMMUNITY): Payer: Self-pay | Admitting: Emergency Medicine

## 2017-10-01 ENCOUNTER — Emergency Department (HOSPITAL_COMMUNITY)
Admission: EM | Admit: 2017-10-01 | Discharge: 2017-10-02 | Disposition: A | Discharge status: Home / Self Care | Payer: Medicare Other | Attending: Emergency Medicine | Admitting: Emergency Medicine

## 2017-10-01 ENCOUNTER — Ambulatory Visit (HOSPITAL_COMMUNITY)
Admission: EM | Admit: 2017-10-01 | Discharge: 2017-10-01 | Disposition: A | Discharge status: Nursing Home (Includes ICF) | Payer: Medicare Other | Source: Home / Self Care | Attending: Internal Medicine | Admitting: Internal Medicine

## 2017-10-01 DIAGNOSIS — R42 Dizziness and giddiness: Secondary | ICD-10-CM | POA: Diagnosis not present

## 2017-10-01 DIAGNOSIS — Z79899 Other long term (current) drug therapy: Secondary | ICD-10-CM | POA: Insufficient documentation

## 2017-10-01 DIAGNOSIS — R079 Chest pain, unspecified: Secondary | ICD-10-CM

## 2017-10-01 DIAGNOSIS — R51 Headache: Secondary | ICD-10-CM | POA: Diagnosis not present

## 2017-10-01 DIAGNOSIS — R55 Syncope and collapse: Secondary | ICD-10-CM | POA: Diagnosis not present

## 2017-10-01 DIAGNOSIS — R519 Headache, unspecified: Secondary | ICD-10-CM

## 2017-10-01 DIAGNOSIS — J45909 Unspecified asthma, uncomplicated: Secondary | ICD-10-CM | POA: Insufficient documentation

## 2017-10-01 HISTORY — DX: Obesity, unspecified: E66.9

## 2017-10-01 HISTORY — DX: Dizziness and giddiness: R42

## 2017-10-01 LAB — CBC WITH DIFFERENTIAL/PLATELET
Basophils Absolute: 0 10*3/uL (ref 0.0–0.1)
Basophils Relative: 0 %
EOS ABS: 0.2 10*3/uL (ref 0.0–0.7)
Eosinophils Relative: 3 %
HEMATOCRIT: 36.6 % (ref 36.0–46.0)
HEMOGLOBIN: 11.8 g/dL — AB (ref 12.0–15.0)
LYMPHS ABS: 1.7 10*3/uL (ref 0.7–4.0)
LYMPHS PCT: 26 %
MCH: 28.3 pg (ref 26.0–34.0)
MCHC: 32.2 g/dL (ref 30.0–36.0)
MCV: 87.8 fL (ref 78.0–100.0)
MONOS PCT: 9 %
Monocytes Absolute: 0.6 10*3/uL (ref 0.1–1.0)
NEUTROS ABS: 4.1 10*3/uL (ref 1.7–7.7)
NEUTROS PCT: 62 %
Platelets: 378 10*3/uL (ref 150–400)
RBC: 4.17 MIL/uL (ref 3.87–5.11)
RDW: 14.2 % (ref 11.5–15.5)
WBC: 6.5 10*3/uL (ref 4.0–10.5)

## 2017-10-01 LAB — BASIC METABOLIC PANEL
Anion gap: 6 (ref 5–15)
BUN: 10 mg/dL (ref 6–20)
CHLORIDE: 107 mmol/L (ref 101–111)
CO2: 26 mmol/L (ref 22–32)
CREATININE: 0.75 mg/dL (ref 0.44–1.00)
Calcium: 8.8 mg/dL — ABNORMAL LOW (ref 8.9–10.3)
GFR calc Af Amer: >60 mL/min (ref >60)
GFR calc non Af Amer: >60 mL/min (ref >60)
GLUCOSE: 94 mg/dL (ref 65–99)
POTASSIUM: 4 mmol/L (ref 3.5–5.1)
Sodium: 139 mmol/L (ref 135–145)

## 2017-10-01 NOTE — ED Provider Notes (Signed)
[facility]    CSN: [id number] Arrival date & time: 10/01/17  1927     History   Chief Complaint Chief Complaint  Patient presents with  . Dizziness    HPI Laurie Fields is a 31 y.o. female.   With history of asthma, pseudotumor cerebri and ovarian cyst, reports that she was seem a few weeks ago for URI symptoms at our Urgent Care and she is still not improving; instead she is getting worse with now chest pain, SOB, wheezing, coughing, congestion, running nose, sneezing, sinus pressure and extremely tired.   She also developed dizziness 1.5 week ago which was evaluated in the ER with negative CT Head and was diagnosed with vertigo and got send home on meclizine. Patient states that her vertigo has not resolved and is getting also getting worse.. Patient have had hx of vertigo but her previous vertigo has never lasted longer than 3 days. She reports that everything is spinning with head movements. She reports that the meclizine is not helping. She reports passing out twice 5 days ago; no head injury reported. She states that her left eye is "jumpy". She is also nausea with headache but no vomiting.       Past Medical History:  Diagnosis Date  . Asthma   . Ovarian cyst   . Pseudotumor cerebri     There are no active problems to display for this patient.   Past Surgical History:  Procedure Laterality Date  . ABDOMINAL HYSTERECTOMY    . carpel  tunnel release    . CESAREAN SECTION    . peritoneal cyst      OB History    No data available       Home Medications    Prior to Admission medications   Medication Sig Start Date End Date Taking? Authorizing Provider  albuterol (PROVENTIL HFA;VENTOLIN HFA) 108 (90 Base) MCG/ACT inhaler Inhale 2 puffs into the lungs every 6 (six) hours as needed for wheezing or shortness of breath.   Yes [provider]  dimenhyDRINATE (DRAMAMINE) 50 MG tablet Take 100 mg by mouth every 6 (six) hours as needed for  dizziness.   Yes [provider]  meclizine (ANTIVERT) 25 MG tablet Take 1 tablet (25 mg total) by mouth 3 (three) times daily as needed for dizziness. 09/26/17  Yes Azalia Bilisampos, Kevin, MD  ondansetron (ZOFRAN ODT) 8 MG disintegrating tablet Take 1 tablet (8 mg total) by mouth every 8 (eight) hours as needed for nausea or vomiting. 09/26/17  Yes Azalia Bilisampos, Kevin, MD  metroNIDAZOLE (FLAGYL) 500 MG tablet Take 500 mg by mouth 2 (two) times daily. 09/20/17   [provider]  metroNIDAZOLE (METROGEL) 0.75 % vaginal gel Place 1 Applicatorful vaginally 2 (two) times daily. 09/14/17   Mardella LaymanHagler, Brian, MD    Family History Family History  Problem Relation Age of Onset  . Hypertension Other   . Diabetes Other   . Cancer Other   . CAD Other   . Leukemia Other     Social History Social History   Tobacco Use  . Smoking status: Never Smoker  . Smokeless tobacco: Never Used  Substance Use Topics  . Alcohol use: No  . Drug use: No     Allergies   Serevent [salmeterol]   Review of Systems Review of Systems  Constitutional: Positive for chills and fatigue. Negative for fever.  HENT: Positive for congestion, ear pain (left ear pain), rhinorrhea, sinus pressure, sinus pain, sneezing and sore throat.  Respiratory: Positive for cough, shortness of breath and wheezing.   Cardiovascular: Positive for chest pain (pain with coughing) and palpitations (Mainly at night).  Gastrointestinal: Positive for nausea. Negative for abdominal pain and vomiting.  Skin: Negative for rash.  Neurological: Positive for dizziness, syncope (Had syncope twice on 11/01) and headaches.     Physical Exam Triage Vital Signs ED Triage Vitals [10/01/17 2012]  Enc Vitals Group     BP 109/69     Pulse Rate 85     Resp 18     Temp 98.5 F (36.9 C)     Temp Source Oral     SpO2 100 %     Weight      Height      Head Circumference      Peak Flow      Pain Score      Pain Loc      Pain Edu?      Excl.  in GC?    No data found.  Updated Vital Signs BP 109/69 (BP Location: Right Arm)   Pulse 85   Temp 98.5 F (36.9 C) (Oral)   Resp 18   SpO2 100%   Physical Exam  Constitutional: She is oriented to person, place, and time. She appears well-developed and well-nourished.  HENT:  Head: Normocephalic.  Right Ear: External ear normal.  Left Ear: External ear normal.  Mouth/Throat: Oropharynx is clear and moist. No oropharyngeal exudate.  Eyes: Conjunctivae are normal. Pupils are equal, round, and reactive to light.  Negative for nystagmus   Neck: Normal range of motion. Neck supple.  Cardiovascular: Normal rate, regular rhythm and normal heart sounds.  No murmur heard. Pulmonary/Chest: Effort normal and breath sounds normal. She has no wheezes.  Abdominal: Soft. Bowel sounds are normal. There is no tenderness.  Lymphadenopathy:    She has no cervical adenopathy.  Neurological: She is alert and oriented to person, place, and time. No sensory deficit. She exhibits normal muscle tone. Coordination normal.  Skin: Skin is warm and dry.  Psychiatric: She has a normal mood and affect.  Nursing note and vitals reviewed.    UC Treatments / Results  Labs (all labs ordered are listed, but only abnormal results are displayed) Labs Reviewed - No data to display  EKG  EKG Interpretation None      Radiology No results found.  Procedures Procedures (including critical care time)  Medications Ordered in UC Medications - No data to display   Initial Impression / Assessment and Plan / UC Course  I have reviewed the triage vital signs and the nursing notes.  Pertinent labs & imaging results that were available during my care of the patient were reviewed by me and considered in my medical decision making (see chart for details).    Final Clinical Impressions(s) / UC Diagnoses   Final diagnoses:  Dizziness  Acute nonintractable headache, unspecified headache type  Chest pain,  unspecified type  Syncope, unspecified syncope type   31 y.o. Female with pseudotumor cerebri and asthma, presenting today with multiple symptoms and reports that her vertigo is getting worse despite meclizine that was prescribed in the ER. Patient has no neurologically deficit but was advised to go to ER for further comprehensive evaluation due to worsening of her condition and lack of supervision and follow up at home.   ED Discharge Orders    None       Controlled Substance Prescriptions [place] Controlled Substance Registry consulted? Not Applicable  Lucia EstelleZheng, Saaya Procell, NP 10/01/17 2106

## 2017-10-01 NOTE — ED Triage Notes (Signed)
Patient reports persistent dizziness/vertigo for several weeks unrelieved by Rx. Meclizine , no emesis or fever .

## 2017-10-01 NOTE — ED Triage Notes (Addendum)
Dizziness.  Has been told this is vertigo and has been taking medicines, but no improvement.  Has continued to have chest congestion.  [facility], [facility]  Patient says left ear is uncomfortable and feels like left eye is jumping

## 2017-10-01 NOTE — ED Notes (Signed)
ED Provider at bedside. 

## 2017-10-01 NOTE — ED Notes (Signed)
Sample for BMP hemolyzed, reordered.

## 2017-10-02 LAB — POC URINE PREG, ED: PREG TEST UR: NEGATIVE

## 2017-10-02 MED ORDER — DIAZEPAM 5 MG PO TABS: 5.0000 mg | ORAL_TABLET | Freq: Three times a day (TID) | ORAL | 0 refills | Status: DC | PRN

## 2017-10-02 MED ORDER — DIAZEPAM 5 MG PO TABS
5.0000 mg | ORAL_TABLET | Freq: Once | ORAL | Status: AC
  Administered 2017-10-02: 5 mg via ORAL
  Filled 2017-10-02: qty 1

## 2017-10-02 NOTE — ED Provider Notes (Signed)
[facility]ENC[facility] Provider Note   CSN: [id number] Arrival date & time: 10/01/17  2106     History   Chief Complaint Chief Complaint  Patient presents with  . Dizziness    Vertigo    HPI Laurie Fields is a 31 y.o. female.  HPI Patient presents to the emergency department with vertigo that has been ongoing over the last 2 weeks the patient states she has been seen several times for this.  Patient states that she was given meclizine which has not helped with her symptoms.  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  The patient states that she is not having any visual disturbance at this time she states position does make the condition worse.  The patient denies chest pain, shortness of breath,blurred vision, neck pain, fever, cough, weakness, numbness,  anorexia, edema, abdominal pain, nausea, vomiting, diarrhea, rash, back pain, dysuria, hematemesis, bloody stool, near syncope, or syncope. Past Medical History:  Diagnosis Date  . Asthma   . Obesity   . Ovarian cyst   . Pseudotumor cerebri   . Vertigo     There are no active problems to display for this patient.   Past Surgical History:  Procedure Laterality Date  . ABDOMINAL HYSTERECTOMY    . carpel  tunnel release    . CESAREAN SECTION    . peritoneal cyst      OB History    No data available       Home Medications    Prior to Admission medications   Medication Sig Start Date End Date Taking? Authorizing Provider  albuterol (PROVENTIL HFA;VENTOLIN HFA) 108 (90 Base) MCG/ACT inhaler Inhale 2 puffs into the lungs every 6 (six) hours as needed for wheezing or shortness of breath.   Yes [provider]  meclizine (ANTIVERT) 25 MG tablet Take 1 tablet (25 mg total) by mouth 3 (three) times daily as needed for dizziness. 09/26/17  Yes Azalia Bilisampos, Kevin, MD  metroNIDAZOLE (METROGEL) 0.75 % vaginal gel Place 1 Applicatorful vaginally 2 (two) times  daily. 09/14/17  Yes Hagler, Arlys JohnBrian, MD  ondansetron (ZOFRAN ODT) 8 MG disintegrating tablet Take 1 tablet (8 mg total) by mouth every 8 (eight) hours as needed for nausea or vomiting. 09/26/17  Yes Azalia Bilisampos, Kevin, MD  diazepam (VALIUM) 5 MG tablet Take 1 tablet (5 mg total) every 8 (eight) hours as needed by mouth. 10/02/17   Charlestine NightLawyer, Laronn Devonshire, PA-C    Family History Family History  Problem Relation Age of Onset  . Hypertension Other   . Diabetes Other   . Cancer Other   . CAD Other   . Leukemia Other     Social History Social History   Tobacco Use  . Smoking status: Never Smoker  . Smokeless tobacco: Never Used  Substance Use Topics  . Alcohol use: No  . Drug use: No     Allergies   Serevent [salmeterol]   Review of Systems Review of Systems All other systems negative except as documented in the HPI. All pertinent positives and negatives as reviewed in the HPI.  Physical Exam Updated Vital Signs BP 109/67   Pulse 67   Temp 98.2 F (36.8 C) (Oral)   Resp 16   Ht 5\' 5"  (1.651 m)   Wt 132 kg (291 lb)   SpO2 100%   BMI 48.42 kg/m   Physical Exam  Constitutional: She is oriented to person, place, and time. She appears well-developed and  well-nourished. No distress.  HENT:  Head: Normocephalic and atraumatic.  Mouth/Throat: Oropharynx is clear and moist.  Eyes: Pupils are equal, round, and reactive to light. Right conjunctiva is not injected. Left conjunctiva is not injected. Right eye exhibits normal extraocular motion and no nystagmus. Left eye exhibits normal extraocular motion and no nystagmus.  Neck: Normal range of motion. Neck supple.  Cardiovascular: Normal rate, regular rhythm and normal heart sounds. Exam reveals no gallop and no friction rub.  No murmur heard. Pulmonary/Chest: Effort normal and breath sounds normal. No respiratory distress. She has no wheezes.  Abdominal: Soft. Bowel sounds are normal. She exhibits no distension. There is no tenderness.    Neurological: She is alert and oriented to person, place, and time. She has normal strength. She exhibits normal muscle tone. Coordination and gait normal. GCS eye subscore is 4. GCS verbal subscore is 5. GCS motor subscore is 6.  Skin: Skin is warm and dry. Capillary refill takes less than 2 seconds. No rash noted. No erythema.  Psychiatric: She has a normal mood and affect. Her behavior is normal.  Nursing note and vitals reviewed.    ED Treatments / Results  Labs (all labs ordered are listed, but only abnormal results are displayed) Labs Reviewed  CBC WITH DIFFERENTIAL/PLATELET - Abnormal; Notable for the following components:      Result Value   Hemoglobin 11.8 (*)    All other components within normal limits  BASIC METABOLIC PANEL - Abnormal; Notable for the following components:   Calcium 8.8 (*)    All other components within normal limits  POC URINE PREG, ED    EKG  EKG Interpretation None       Radiology No results found.  Procedures Procedures (including critical care time)  Medications Ordered in ED Medications  diazepam (VALIUM) tablet 5 mg (5 mg Oral Given 10/02/17 0033)     Initial Impression / Assessment and Plan / ED Course  I have reviewed the triage vital signs and the nursing notes.  Pertinent labs & imaging results that were available during my care of the patient were reviewed by me and considered in my medical decision making (see chart for details).    Patient is having no visual disturbance or other neurological deficits.  Advised the patient that her vertigo can persist for more than just a few days like she is normally accustomed to and did start her on Valium to try to help with her vertigo I advised her she will need to see her neurologist as soon as possible.  Told return here as needed.  Patient agrees the plan and all questions were answered  Final Clinical Impressions(s) / ED Diagnoses   Final diagnoses:  Vertigo    ED Discharge  Orders        Ordered    diazepam (VALIUM) 5 MG tablet  Every 8 hours PRN     10/02/17 0324       Charlestine NightLawyer, Keylor Rands, PA-C 10/02/17 0621    Ward, Layla MawKristen N, DO 10/02/17 [phone]

## 2017-10-02 NOTE — Discharge Instructions (Signed)
You will need to follow-up with your neurologist.  Return here as needed.  Increase your fluid intake

## 2017-10-08 ENCOUNTER — Encounter: Payer: Self-pay | Admitting: Neurology

## 2017-10-08 ENCOUNTER — Ambulatory Visit (INDEPENDENT_AMBULATORY_CARE_PROVIDER_SITE_OTHER): Payer: Medicare Other | Admitting: Neurology

## 2017-10-08 VITALS — BP 114/69 | HR 63 | Ht 65.0 in | Wt 280.8 lb

## 2017-10-08 DIAGNOSIS — G43709 Chronic migraine without aura, not intractable, without status migrainosus: Secondary | ICD-10-CM

## 2017-10-08 DIAGNOSIS — G932 Benign intracranial hypertension: Secondary | ICD-10-CM

## 2017-10-08 DIAGNOSIS — IMO0002 Reserved for concepts with insufficient information to code with codable children: Secondary | ICD-10-CM

## 2017-10-08 MED ORDER — TOPIRAMATE 100 MG PO TABS: 100.0000 mg | ORAL_TABLET | Freq: Two times a day (BID) | ORAL | 11 refills | Status: DC

## 2017-10-08 MED ORDER — RIZATRIPTAN BENZOATE 5 MG PO TBDP: 5.0000 mg | ORAL_TABLET | ORAL | 12 refills | Status: DC | PRN

## 2017-10-08 MED ORDER — ONDANSETRON 8 MG PO TBDP: 8.0000 mg | ORAL_TABLET | Freq: Three times a day (TID) | ORAL | 6 refills | Status: DC | PRN

## 2017-10-08 NOTE — Progress Notes (Signed)
PATIENT: Laurie Fields DOB: 1986/03/29  Chief Complaint  Patient presents with  . Pseudotumor Cerebri    She has previously been [facility].  She has daily headaches, dizziness and blurred vision.  She started Topamax on 10/03/17 and is currenlty taking 50mg , BID.  She is titrating up to 100mg , BID.    Marland Kitchen. Ophthalmology    Glenice LaineGivre, Syndee J, MD  . PCP    Mammie LorenzoBauman, Ted Albert, MD     HISTORICAL  Laurie Fields is 31 years old female, seen in refer by her ophthalmologist Dr. Augustine RadarGivre, Maxine GlennSyndee reevaluation of pseudotumor cerebri, initial evaluation was on October 08 2017. Primary care physician is Dr. Mammie LorenzoBauman Ted Albert.   I reviewed and summarized the referring note from [facility], most recent visit was from Dr. Dustin FolksMichael Bowman on October 03 2017, she complained of headaches, reported a history of pseudotumor 3 bright, she was given prescription of Topamax, titrating to 100 mg twice a day. Also was given Zofran, and NSAIDs as needed for abortive treatment,  Patient reported a long history of pseudotumor cerebri, she presented with constant headaches at age 31, spinal tap reported had open pressure of more than 50 cm water, the spinal tap did help her headaches, she had recurrent severe headache around age 31, also had lost her vision for few weeks per patient, everything was hazy, sensitive to light, she had another lumbar puncture, which also demonstrated elevated open pressure,  She was [facility] since 2014 for persistent headache, blurry vision, double vision, vertigo, per patient, there was elevated open pressure on lumbar puncture, she has been followed by ophthalmologist, was treated with Topamax along with intermittent lumbar puncture, which always helped her headache,  She noticed if she loses weight, her headache actually getting worse, she tried to gain some weight to have her headache under better control, in recent few  months, she complains of constant almost daily headaches, starting at right neck, radiating to right retro-orbital area severe pounding headache with associated light noise sensitivity, nauseous,  September 14 2017, when she get up quickly, she also had transient passing out episode, there was reported hypoglycemia   Topamax was helping her headache, recently she also complains of increased daily headaches, with associated blurry vision, dizziness  CT head without contrast in November 2018 was normal.  Laboratory evaluation showed normal BMP, CBC showed hemoglobin of 12,  REVIEW OF SYSTEMS: Full 14 system review of systems performed and notable only for fatigue, palpitation, ringing ears, spinning sensation, blurred vision, double vision, memory loss, confusion, headaches, weakness, dizziness, passing out, anxiety, insomnia, restless leg,  ALLERGIES: Allergies  Allergen Reactions  . Serevent [Salmeterol] Nausea And Vomiting and Other (See Comments)    Hallucinations    HOME MEDICATIONS: Current Outpatient Medications  Medication Sig Dispense Refill  . albuterol (PROVENTIL HFA;VENTOLIN HFA) 108 (90 Base) MCG/ACT inhaler Inhale 2 puffs into the lungs every 6 (six) hours as needed for wheezing or shortness of breath.    . diazepam (VALIUM) 5 MG tablet Take 1 tablet (5 mg total) every 8 (eight) hours as needed by mouth. 15 tablet 0  . meclizine (ANTIVERT) 25 MG tablet Take 1 tablet (25 mg total) by mouth 3 (three) times daily as needed for dizziness. 20 tablet 0  . metroNIDAZOLE (METROGEL) 0.75 % vaginal gel Place 1 Applicatorful vaginally 2 (two) times daily. 70 g 0  . ondansetron (ZOFRAN ODT) 8 MG disintegrating tablet Take 1 tablet (8 mg total)  by mouth every 8 (eight) hours as needed for nausea or vomiting. 10 tablet 0  . topiramate (TOPAMAX) 25 MG tablet Titrating up to 100mg , BID.  1   No current facility-administered medications for this visit.     PAST MEDICAL HISTORY: Past Medical  History:  Diagnosis Date  . Asthma   . Blurred vision   . Depression   . Migraine   . Obesity   . Ovarian cyst   . Pseudotumor cerebri   . Vertigo     PAST SURGICAL HISTORY: Past Surgical History:  Procedure Laterality Date  . ABDOMINAL HYSTERECTOMY    . carpel  tunnel release    . CESAREAN SECTION    . peritoneal cyst      FAMILY HISTORY: Family History  Problem Relation Age of Onset  . Hypertension Other   . Diabetes Other   . Cancer Other   . CAD Other   . Leukemia Other   . Graves' disease Mother   . Other Mother        low blood sugar  . Hypertension Father   . Diabetes Maternal Grandmother   . Colon polyps Maternal Grandmother   . Glaucoma Maternal Grandfather   . Cataracts Maternal Grandfather   . Prostate cancer Maternal Grandfather   . Diabetes Paternal Grandmother   . Cataracts Paternal Grandfather   . Prostate cancer Paternal Grandfather     SOCIAL HISTORY:  Social History   Socioeconomic History  . Marital status: Single    Spouse name: Not on file  . Number of children: 4  . Years of education: college  . Highest education level: Not on file  Social Needs  . Financial resource strain: Not on file  . Food insecurity - worry: Not on file  . Food insecurity - inability: Not on file  . Transportation needs - medical: Not on file  . Transportation needs - non-medical: Not on file  Occupational History  . Occupation: Retail  Tobacco Use  . Smoking status: Never Smoker  . Smokeless tobacco: Never Used  Substance and Sexual Activity  . Alcohol use: No  . Drug use: No  . Sexual activity: Not on file  Other Topics Concern  . Not on file  Social History Narrative   Lives at home with her children. She has four living sons. She had one daughter that passed away at age 16 with Aplastic anemia.   Right-handed.   2-3 cups of caffeine weekly.     PHYSICAL EXAM   Vitals:   10/08/17 0954  BP: 114/69  Pulse: 63  Weight: 280 lb 12 oz (127.3  kg)  Height: 5\' 5"  (1.651 m)    Not recorded      Body mass index is 46.72 kg/m.  PHYSICAL EXAMNIATION:  Gen: NAD, conversant, well nourised, obese, well groomed                     Cardiovascular: Regular rate rhythm, no peripheral edema, warm, nontender. Eyes: Conjunctivae clear without exudates or hemorrhage Neck: Supple, no carotid bruits. Pulmonary: Clear to auscultation bilaterally   NEUROLOGICAL EXAM:  MENTAL STATUS: Speech:    Speech is normal; fluent and spontaneous with normal comprehension.  Cognition:     Orientation to time, place and person     Normal recent and remote memory     Normal Attention span and concentration     Normal Language, naming, repeating,spontaneous speech     Fund of knowledge  CRANIAL NERVES: CN II: Visual fields are full to confrontation. Fundoscopic exam is normal with sharp discs and no vascular changes. Pupils are round equal and briskly reactive to light. CN III, IV, VI: extraocular movement are normal. No ptosis. CN V: Facial sensation is intact to pinprick in all 3 divisions bilaterally. Corneal responses are intact.  CN VII: Face is symmetric with normal eye closure and smile. CN VIII: Hearing is normal to rubbing fingers CN IX, X: Palate elevates symmetrically. Phonation is normal. CN XI: Head turning and shoulder shrug are intact CN XII: Tongue is midline with normal movements and no atrophy.  MOTOR: There is no pronator drift of out-stretched arms. Muscle bulk and tone are normal. Muscle strength is normal.  REFLEXES: Reflexes are 2+ and symmetric at the biceps, triceps, knees, and ankles. Plantar responses are flexor.  SENSORY: Intact to light touch, pinprick, positional sensation and vibratory sensation are intact in fingers and toes.  COORDINATION: Rapid alternating movements and fine finger movements are intact. There is no dysmetria on finger-to-nose and heel-knee-shin.    GAIT/STANCE: Posture is normal. Gait is  steady with normal steps, base, arm swing, and turning. Heel and toe walking are normal. Tandem gait is normal.  Romberg is absent.   DIAGNOSTIC DATA (LABS, IMAGING, TESTING) - I reviewed patient records, labs, notes, testing and imaging myself where available.   ASSESSMENT AND PLAN  Laurie Fields is a 31 y.o. female   Chronic daily headache with migraine features,  History of pseudotumor cerebri, I did not see papillary edema, I was not able to appreciate venous pulsations either.  Normal CT head in November 2018  Refer her to repeat lumbar puncture  Increase Topamax to 100 mg twice a day as migraine prevention  Maxalt and Zofran as needed for abortive treatment  Passing out episode  This could be related to the pain, vasovagal respond  EEG   Levert FeinsteinYijun Shakiara Lukic, M.D. Ph.D.  [facility] [address], [address] [place], [place] [address] Ph: [phone] Fax: [phone]  CC: Glenice LaineGivre, Syndee J, MD

## 2017-10-09 ENCOUNTER — Telehealth: Payer: Self-pay | Admitting: Neurology

## 2017-10-09 ENCOUNTER — Encounter: Payer: Self-pay | Admitting: *Deleted

## 2017-10-09 NOTE — Telephone Encounter (Signed)
Pt called said the infusion helped yesterday until about midnight, the HA returned. She is wanting to come back in today for another infusion. Please call to advise. Thank you

## 2017-10-09 NOTE — Telephone Encounter (Addendum)
Per vo by Dr. Terrace ArabiaYan, pt can come in for an infusion of VPA 1gram, Toradol 30mg .  Also, Solu-medrol 250mg , if needed.  If she has a driver, then she may also get compazine 10mg .  Left message requesting a return call.

## 2017-10-09 NOTE — Telephone Encounter (Addendum)
Spoke to patient - she does not have a driver available but is agreeable to an infusion of VPA 1 gram and Toradol 30mg .  Dr. Terrace ArabiaYan has also order Solu-medrol 250mg , if needed.  The patient is in our lobby now and Intrafusion is ready for her.  Signed [facility].

## 2017-10-10 ENCOUNTER — Encounter: Payer: Self-pay | Admitting: Neurology

## 2017-10-10 ENCOUNTER — Ambulatory Visit (INDEPENDENT_AMBULATORY_CARE_PROVIDER_SITE_OTHER): Payer: Medicare Other | Admitting: Neurology

## 2017-10-10 ENCOUNTER — Encounter: Payer: Self-pay | Admitting: *Deleted

## 2017-10-10 VITALS — BP 115/77 | HR 74 | Ht 65.0 in | Wt 280.8 lb

## 2017-10-10 DIAGNOSIS — R51 Headache: Secondary | ICD-10-CM

## 2017-10-10 DIAGNOSIS — R519 Headache, unspecified: Secondary | ICD-10-CM

## 2017-10-10 NOTE — Progress Notes (Signed)
**  Bupivacaine 0.5%, NDC [id number], Lot [id number], exp 03/27/2019.//mck,rn**

## 2017-10-11 ENCOUNTER — Emergency Department (HOSPITAL_COMMUNITY)
Admission: EM | Admit: 2017-10-11 | Discharge: 2017-10-12 | Disposition: A | Discharge status: Home / Self Care | Payer: Medicare Other | Attending: Emergency Medicine | Admitting: Emergency Medicine

## 2017-10-11 ENCOUNTER — Encounter (HOSPITAL_COMMUNITY): Payer: Self-pay | Admitting: Nurse Practitioner

## 2017-10-11 ENCOUNTER — Emergency Department (HOSPITAL_COMMUNITY): Payer: Medicare Other

## 2017-10-11 DIAGNOSIS — R0602 Shortness of breath: Secondary | ICD-10-CM | POA: Diagnosis not present

## 2017-10-11 DIAGNOSIS — Z79899 Other long term (current) drug therapy: Secondary | ICD-10-CM | POA: Insufficient documentation

## 2017-10-11 DIAGNOSIS — R079 Chest pain, unspecified: Secondary | ICD-10-CM | POA: Diagnosis not present

## 2017-10-11 DIAGNOSIS — J45909 Unspecified asthma, uncomplicated: Secondary | ICD-10-CM | POA: Diagnosis not present

## 2017-10-11 LAB — I-STAT TROPONIN, ED: TROPONIN I, POC: 0 ng/mL (ref 0.00–0.08)

## 2017-10-11 NOTE — ED Triage Notes (Signed)
Pt states since she got a head nerve block for her migraines from her neurologisy, she has been experiencing chest pain and shortness. She is also concerned that she may have a PNA, adding that she has hx of recurrent pna.

## 2017-10-11 NOTE — Progress Notes (Signed)
   History:   Patient complains intractable headache despite 2 consecutive days of IV Depacon treatment,  Bilateral occipital and trigeminal nerve block; trigger point injection of bilateral cervical and upper trapezius muscles for intractable headache  Bupivacaine 0.5% was injected on the scalp bilaterally at several locations:  -On the occipital area of the head, 3 injections each side, 0.5 cc per injection at the midpoint between the mastoid process and the occipital protuberance. 2 other injections were done one finger breadth from the initial injection, one at a 10 o'clock position and the other at a 2 o'clock position.  -2 injections of 0.5 cc were done in the temporal regions, 2 fingerbreadths above the tragus of the ear, with the second injection one fingerbreadth posteriorly to the first.  -2 injections were done on the brow, 1 in the medial brow and one over the supraorbital nerve notch, with 0.1 cc for each injection  -1 injection each side of 0.5 cc was done anterior to the tragus of the ear for a trigeminal ganglion injection  -0.5 cc was injected into bilateral upper trapezius and bilateral upper cervical paraspinals   The patient tolerated the injections well, no complications of the procedure were noted. Injections were made with a 27-gauge needle.

## 2017-10-11 NOTE — ED Provider Notes (Signed)
[facility]-EMERGENCY DEPT Provider Note   CSN: [id number] Arrival date & time: 10/11/17  2154     History   Chief Complaint Chief Complaint  Patient presents with  . Chest Pain  . Shortness of Breath    HPI Laurie Fields is a 31 y.o. female.  Patient with history of pseudotumor cerebri, recurrent headaches, history of DVT while pregnant approximately 10 years ago --presents with complaints of chest pain on the left side.  Patient states that she has had some pressure for about 2 weeks.  She has had more sharp pain over the past 2 days.  She has been dealing with headaches and notes that she had injections in the back of her head yesterday and wonders if she is having a reaction to this medication.  She states that she feels very short of breath with activity.  She has had a "charley horse" in her left calf for the past 2 weeks.  No nausea, vomiting, diarrhea.  No fevers or hemoptysis.  No estrogen use. The onset of this condition was acute. The course is constant. Aggravating factors: activity. Alleviating factors: none.        Past Medical History:  Diagnosis Date  . Asthma   . Blurred vision   . Depression   . Migraine   . Obesity   . Ovarian cyst   . Pseudotumor cerebri   . Vertigo     Patient Active Problem List   Diagnosis Date Noted  . Chronic migraine 10/08/2017  . Pseudotumor cerebri 10/08/2017    Past Surgical History:  Procedure Laterality Date  . ABDOMINAL HYSTERECTOMY    . carpel  tunnel release    . CESAREAN SECTION    . peritoneal cyst      OB History    No data available       Home Medications    Prior to Admission medications   Medication Sig Start Date End Date Taking? Authorizing Provider  albuterol (PROVENTIL HFA;VENTOLIN HFA) 108 (90 Base) MCG/ACT inhaler Inhale 2 puffs into the lungs every 6 (six) hours as needed for wheezing or shortness of breath.   Yes [provider]  diazepam (VALIUM) 5 MG  tablet Take 1 tablet (5 mg total) every 8 (eight) hours as needed by mouth. Patient taking differently: Take 5 mg every 8 (eight) hours as needed by mouth (brain disorder).  10/02/17  Yes Lawyer, Cristal Deer, PA-C  meclizine (ANTIVERT) 25 MG tablet Take 1 tablet (25 mg total) by mouth 3 (three) times daily as needed for dizziness. 09/26/17  Yes Azalia Bilis, MD  ondansetron (ZOFRAN ODT) 8 MG disintegrating tablet Take 1 tablet (8 mg total) every 8 (eight) hours as needed by mouth for nausea or vomiting. 10/08/17  Yes Levert Feinstein, MD  rizatriptan (MAXALT-MLT) 5 MG disintegrating tablet Take 1 tablet (5 mg total) as needed by mouth. May repeat in 2 hours if needed Patient taking differently: Take 5 mg as needed by mouth for migraine. May repeat in 2 hours if needed 10/08/17  Yes Levert Feinstein, MD  topiramate (TOPAMAX) 100 MG tablet Take 1 tablet (100 mg total) 2 (two) times daily by mouth. Titrating up to 100mg , BID. 10/08/17  Yes Levert Feinstein, MD  metroNIDAZOLE (METROGEL) 0.75 % vaginal gel Place 1 Applicatorful vaginally 2 (two) times daily. Patient not taking: Reported on 10/11/2017 09/14/17   Mardella Layman, MD    Family History Family History  Problem Relation Age of Onset  . Hypertension Other   .  Diabetes Other   . Cancer Other   . CAD Other   . Leukemia Other   . Graves' disease Mother   . Other Mother        low blood sugar  . Hypertension Father   . Diabetes Maternal Grandmother   . Colon polyps Maternal Grandmother   . Glaucoma Maternal Grandfather   . Cataracts Maternal Grandfather   . Prostate cancer Maternal Grandfather   . Diabetes Paternal Grandmother   . Cataracts Paternal Grandfather   . Prostate cancer Paternal Grandfather     Social History Social History   Tobacco Use  . Smoking status: Never Smoker  . Smokeless tobacco: Never Used  Substance Use Topics  . Alcohol use: No  . Drug use: No     Allergies   Serevent [salmeterol]   Review of Systems Review of  Systems  Constitutional: Negative for diaphoresis and fever.  Eyes: Negative for redness.  Respiratory: Positive for shortness of breath. Negative for cough.   Cardiovascular: Positive for chest pain. Negative for palpitations and leg swelling.  Gastrointestinal: Positive for nausea. Negative for abdominal pain and vomiting.  Genitourinary: Negative for dysuria.  Musculoskeletal: Negative for back pain and neck pain.  Skin: Negative for rash.  Neurological: Positive for headaches. Negative for syncope and light-headedness.  Psychiatric/Behavioral: The patient is not nervous/anxious.      Physical Exam Updated Vital Signs BP 112/78 (BP Location: Left Arm)   Pulse 83   Temp 98.4 F (36.9 C) (Oral)   Resp 18   SpO2 100%   Physical Exam  Constitutional: She appears well-developed and well-nourished.  HENT:  Head: Normocephalic and atraumatic.  Eyes: Conjunctivae are normal. Right eye exhibits no discharge. Left eye exhibits no discharge.  Neck: Normal range of motion. Neck supple.  Cardiovascular: Normal rate, regular rhythm and normal heart sounds.  Pulmonary/Chest: Effort normal and breath sounds normal.  Pt states she feels very short of breath. She is able to talk in full sentences without distress however.  Abdominal: Soft. There is no tenderness.  Neurological: She is alert.  Skin: Skin is warm and dry.  Psychiatric: She has a normal mood and affect.  Nursing note and vitals reviewed.    ED Treatments / Results  Labs (all labs ordered are listed, but only abnormal results are displayed) Labs Reviewed  CBC - Abnormal; Notable for the following components:      Result Value   WBC 10.6 (*)    Platelets 420 (*)    All other components within normal limits  BASIC METABOLIC PANEL - Abnormal; Notable for the following components:   Calcium 8.5 (*)    All other components within normal limits  I-STAT TROPONIN, ED    EKG  EKG Interpretation None        Radiology Dg Chest 2 View  Result Date: 10/11/2017 CLINICAL DATA:  Chest pain and shortness of breath EXAM: CHEST  2 VIEW COMPARISON:  09/13/2016 FINDINGS: The heart size and mediastinal contours are within normal limits. Both lungs are clear. The visualized skeletal structures are unremarkable. IMPRESSION: No active cardiopulmonary disease. Electronically Signed   By: Jasmine PangKim  Fujinaga M.D.   On: 10/11/2017 23:54    Procedures Procedures (including critical care time)  Medications Ordered in ED Medications - No data to display   Initial Impression / Assessment and Plan / ED Course  I have reviewed the triage vital signs and the nursing notes.  Pertinent labs & imaging results that were available  during my care of the patient were reviewed by me and considered in my medical decision making (see chart for details).     Patient seen and examined. Work-up initiated.   Vital signs reviewed and are as follows: BP 112/78 (BP Location: Left Arm)   Pulse 83   Temp 98.4 F (36.9 C) (Oral)   Resp 18   SpO2 100%   Chest x-ray reviewed by myself and is clear.  Given history of DVT and "charley horse", will perform CT angiography of the chest to rule out PE and occult pneumonia.  Patient had a borderline d-dimer earlier this month.    1:09 AM Pt updated on plan, she is agreeable to proceeding with CT chest.   Final Clinical Impressions(s) / ED Diagnoses   Final diagnoses:  None    ED Discharge Orders    None       Renne CriglerGeiple, Deyjah Kindel, PA-C 10/16/17 [id number]    Rolland PorterJames, Mark, MD 10/29/17 [phone]

## 2017-10-12 ENCOUNTER — Emergency Department (HOSPITAL_COMMUNITY): Payer: Medicare Other

## 2017-10-12 ENCOUNTER — Encounter (HOSPITAL_COMMUNITY): Payer: Self-pay | Admitting: Radiology

## 2017-10-12 LAB — BASIC METABOLIC PANEL
ANION GAP: 6 (ref 5–15)
BUN: 16 mg/dL (ref 6–20)
CHLORIDE: 110 mmol/L (ref 101–111)
CO2: 23 mmol/L (ref 22–32)
Calcium: 8.5 mg/dL — ABNORMAL LOW (ref 8.9–10.3)
Creatinine, Ser: 0.79 mg/dL (ref 0.44–1.00)
GFR calc non Af Amer: >60 mL/min (ref >60)
Glucose, Bld: 78 mg/dL (ref 65–99)
POTASSIUM: 3.9 mmol/L (ref 3.5–5.1)
SODIUM: 139 mmol/L (ref 135–145)

## 2017-10-12 LAB — CBC
HCT: 39 % (ref 36.0–46.0)
Hemoglobin: 12.7 g/dL (ref 12.0–15.0)
MCH: 28.1 pg (ref 26.0–34.0)
MCHC: 32.6 g/dL (ref 30.0–36.0)
MCV: 86.3 fL (ref 78.0–100.0)
PLATELETS: 420 10*3/uL — AB (ref 150–400)
RBC: 4.52 MIL/uL (ref 3.87–5.11)
RDW: 14.2 % (ref 11.5–15.5)
WBC: 10.6 10*3/uL — ABNORMAL HIGH (ref 4.0–10.5)

## 2017-10-12 MED ORDER — IOPAMIDOL (ISOVUE-370) INJECTION 76%
100.0000 mL | Freq: Once | INTRAVENOUS | Status: AC | PRN
  Administered 2017-10-12: 100 mL via INTRAVENOUS

## 2017-10-12 MED ORDER — PREDNISONE 20 MG PO TABS: 40.0000 mg | ORAL_TABLET | Freq: Every day | ORAL | 0 refills | Status: DC

## 2017-10-12 MED ORDER — IOPAMIDOL (ISOVUE-370) INJECTION 76%
INTRAVENOUS | Status: AC
  Filled 2017-10-12: qty 100

## 2017-10-12 NOTE — ED Provider Notes (Signed)
Patient signed out to me by [facility].  Waiting for CT PE for CP and SOB.  CT is negative.  Patient does have asthma.  Labs and imaging are reassuring.  Plan for discharge to home with prednisone and rescue inhaler.    Patient reassessed by me.  She is stable and ready for discharge.  PCP follow-up.   Roxy HorsemanBrowning, Taneia Mealor, PA-C 10/12/17 0322    Melene PlanFloyd, Dan, DO 10/12/17 [phone]

## 2017-10-14 ENCOUNTER — Emergency Department (HOSPITAL_COMMUNITY)
Admission: EM | Admit: 2017-10-14 | Discharge: 2017-10-14 | Disposition: A | Discharge status: Home / Self Care | Payer: Medicare Other | Attending: Emergency Medicine | Admitting: Emergency Medicine

## 2017-10-14 ENCOUNTER — Emergency Department (HOSPITAL_COMMUNITY): Payer: Medicare Other

## 2017-10-14 ENCOUNTER — Encounter (HOSPITAL_COMMUNITY): Payer: Self-pay | Admitting: Emergency Medicine

## 2017-10-14 ENCOUNTER — Emergency Department (HOSPITAL_BASED_OUTPATIENT_CLINIC_OR_DEPARTMENT_OTHER): Admit: 2017-10-14 | Discharge: 2017-10-14 | Disposition: A | Discharge status: Home / Self Care | Payer: Medicare Other

## 2017-10-14 DIAGNOSIS — Z79899 Other long term (current) drug therapy: Secondary | ICD-10-CM | POA: Diagnosis not present

## 2017-10-14 DIAGNOSIS — J069 Acute upper respiratory infection, unspecified: Secondary | ICD-10-CM | POA: Diagnosis not present

## 2017-10-14 DIAGNOSIS — J45909 Unspecified asthma, uncomplicated: Secondary | ICD-10-CM | POA: Diagnosis not present

## 2017-10-14 DIAGNOSIS — R05 Cough: Secondary | ICD-10-CM | POA: Diagnosis present

## 2017-10-14 DIAGNOSIS — M79609 Pain in unspecified limb: Secondary | ICD-10-CM

## 2017-10-14 LAB — I-STAT TROPONIN, ED: Troponin i, poc: 0 ng/mL (ref 0.00–0.08)

## 2017-10-14 LAB — CBC
HEMATOCRIT: 38.6 % (ref 36.0–46.0)
Hemoglobin: 12.9 g/dL (ref 12.0–15.0)
MCH: 28.7 pg (ref 26.0–34.0)
MCHC: 33.4 g/dL (ref 30.0–36.0)
MCV: 85.8 fL (ref 78.0–100.0)
PLATELETS: 374 10*3/uL (ref 150–400)
RBC: 4.5 MIL/uL (ref 3.87–5.11)
RDW: 13.9 % (ref 11.5–15.5)
WBC: 8.5 10*3/uL (ref 4.0–10.5)

## 2017-10-14 LAB — BASIC METABOLIC PANEL
Anion gap: 5 (ref 5–15)
BUN: 12 mg/dL (ref 6–20)
CO2: 24 mmol/L (ref 22–32)
CREATININE: 0.78 mg/dL (ref 0.44–1.00)
Calcium: 8.6 mg/dL — ABNORMAL LOW (ref 8.9–10.3)
Chloride: 109 mmol/L (ref 101–111)
GFR calc Af Amer: >60 mL/min (ref >60)
Glucose, Bld: 89 mg/dL (ref 65–99)
POTASSIUM: 3.7 mmol/L (ref 3.5–5.1)
SODIUM: 138 mmol/L (ref 135–145)

## 2017-10-14 LAB — RAPID STREP SCREEN (MED CTR MEBANE ONLY): STREPTOCOCCUS, GROUP A SCREEN (DIRECT): NEGATIVE

## 2017-10-14 MED ORDER — FLUTICASONE PROPIONATE 50 MCG/ACT NA SUSP: 1.0000 | Freq: Every day | NASAL | 0 refills | Status: DC

## 2017-10-14 NOTE — ED Provider Notes (Signed)
[facility]-EMERGENCY DEPT Provider Note   CSN: [id number] Arrival date & time: 10/14/17  [id number]     History   Chief Complaint Chief Complaint  Patient presents with  . Headache  . Chest Pain  . Leg Pain    HPI Laurie Fields is a 31 y.o. female presenting with left leg pain, shortness of breath, and cough.  Patient states that for the past 3 weeks, she has had persistent cough, chest pressure, shortness of breath.  She was evaluated in the ER multiple times, last 2 days ago.  She had a CT scan of the chest which was negative for PE or infection.  Patient states she has a history of DVT when she was pregnant.  She has not been on blood thinners for the past 2 years.  She reports left leg pain for the past 3 days, it feels like a cramp isolated to the calf, worse when walking.  Patient states that her chest pressure and shortness of breath are worse when she is ambulatory.  She has never had any heart problems.  She was told by her primary care doctor that she should come to the emergency room to rule out blood clot before her appointment tomorrow.  She reports sinus pressure.  Cough is worse at night, she reports postnasal drip.  She denies sick contacts.  She is currently on prednisone.  She denies fevers, chills, nausea, vomiting, abdominal pain, urinary symptoms, normal bowel movements.   HPI  Past Medical History:  Diagnosis Date  . Asthma   . Blurred vision   . Depression   . Migraine   . Obesity   . Ovarian cyst   . Pseudotumor cerebri   . Vertigo     Patient Active Problem List   Diagnosis Date Noted  . Chronic migraine 10/08/2017  . Pseudotumor cerebri 10/08/2017    Past Surgical History:  Procedure Laterality Date  . ABDOMINAL HYSTERECTOMY    . carpel  tunnel release    . CESAREAN SECTION    . peritoneal cyst      OB History    No data available       Home Medications    Prior to Admission medications   Medication Sig  Start Date End Date Taking? Authorizing Provider  albuterol (PROVENTIL HFA;VENTOLIN HFA) 108 (90 Base) MCG/ACT inhaler Inhale 2 puffs into the lungs every 6 (six) hours as needed for wheezing or shortness of breath.   Yes [provider]  diazepam (VALIUM) 5 MG tablet Take 1 tablet (5 mg total) every 8 (eight) hours as needed by mouth. Patient taking differently: Take 5 mg every 8 (eight) hours as needed by mouth (brain disorder).  10/02/17  Yes Lawyer, Cristal Deerhristopher, PA-C  meclizine (ANTIVERT) 25 MG tablet Take 1 tablet (25 mg total) by mouth 3 (three) times daily as needed for dizziness. 09/26/17  Yes Azalia Bilisampos, Kevin, MD  metroNIDAZOLE (METROGEL) 0.75 % vaginal gel Place 1 Applicatorful vaginally 2 (two) times daily. 09/14/17  Yes Hagler, Arlys JohnBrian, MD  ondansetron (ZOFRAN ODT) 8 MG disintegrating tablet Take 1 tablet (8 mg total) every 8 (eight) hours as needed by mouth for nausea or vomiting. 10/08/17  Yes Levert FeinsteinYan, Yijun, MD  predniSONE (DELTASONE) 20 MG tablet Take 2 tablets (40 mg total) daily by mouth. 10/12/17  Yes Roxy HorsemanBrowning, Robert, PA-C  rizatriptan (MAXALT-MLT) 5 MG disintegrating tablet Take 1 tablet (5 mg total) as needed by mouth. May repeat in 2 hours if needed Patient taking differently:  Take 5 mg as needed by mouth for migraine. May repeat in 2 hours if needed 10/08/17  Yes Levert Feinstein, MD  topiramate (TOPAMAX) 100 MG tablet Take 1 tablet (100 mg total) 2 (two) times daily by mouth. Titrating up to 100mg , BID. 10/08/17  Yes Levert Feinstein, MD  fluticasone (FLONASE) 50 MCG/ACT nasal spray Place 1 spray daily into both nostrils. 10/14/17   Shereece Wellborn, PA-C    Family History Family History  Problem Relation Age of Onset  . Hypertension Other   . Diabetes Other   . Cancer Other   . CAD Other   . Leukemia Other   . Graves' disease Mother   . Other Mother        low blood sugar  . Hypertension Father   . Diabetes Maternal Grandmother   . Colon polyps Maternal Grandmother   .  Glaucoma Maternal Grandfather   . Cataracts Maternal Grandfather   . Prostate cancer Maternal Grandfather   . Diabetes Paternal Grandmother   . Cataracts Paternal Grandfather   . Prostate cancer Paternal Grandfather     Social History Social History   Tobacco Use  . Smoking status: Never Smoker  . Smokeless tobacco: Never Used  Substance Use Topics  . Alcohol use: No  . Drug use: No     Allergies   Serevent [salmeterol]   Review of Systems Review of Systems  HENT: Positive for postnasal drip and sinus pressure.   Respiratory: Positive for cough and shortness of breath.   Cardiovascular: Positive for chest pain and leg swelling.  Neurological: Positive for dizziness.  All other systems reviewed and are negative.    Physical Exam Updated Vital Signs BP 98/77   Pulse 63   Temp 98.4 F (36.9 C) (Oral)   Resp 18   Ht 5\' 5"  (1.651 m)   Wt 127 kg (280 lb)   SpO2 100%   BMI 46.59 kg/m   Physical Exam  Constitutional: She is oriented to person, place, and time. She appears well-developed and well-nourished. No distress.  HENT:  Head: Normocephalic and atraumatic.  Right Ear: Tympanic membrane, external ear and ear canal normal.  Left Ear: Tympanic membrane, external ear and ear canal normal.  Nose: Nose normal.  Mouth/Throat: Uvula is midline, oropharynx is clear and moist and mucous membranes are normal. Tonsils are 1+ on the right. Tonsils are 1+ on the left. No tonsillar exudate.  Nasal mucosal edema and bilateral tonsillar edema.  No exudate noted.  Eyes: Conjunctivae and EOM are normal. Pupils are equal, round, and reactive to light.  Neck: Normal range of motion. Neck supple.  Cardiovascular: Normal rate, regular rhythm and intact distal pulses.  Pulmonary/Chest: Effort normal and breath sounds normal. No stridor. No respiratory distress. She has no wheezes. She has no rales. She exhibits tenderness.  Anterior chest wall tenderness  Abdominal: Soft. She  exhibits no distension and no mass. There is no tenderness. There is no guarding.  Musculoskeletal: She exhibits tenderness.  Swelling of left lower leg and tenderness of the calf.  No erythema.  Pedal pulses equal bilaterally.  Patient is ambulatory.  Neurological: She is alert and oriented to person, place, and time.  Skin: Skin is warm and dry.  Psychiatric: She has a normal mood and affect.  Nursing note and vitals reviewed.    ED Treatments / Results  Labs (all labs ordered are listed, but only abnormal results are displayed) Labs Reviewed  BASIC METABOLIC PANEL - Abnormal; Notable for  the following components:      Result Value   Calcium 8.6 (*)    All other components within normal limits  RAPID STREP SCREEN (NOT AT [facility])  CULTURE, GROUP A STREP [facility])  CBC  I-STAT TROPONIN, ED    EKG  EKG Interpretation None       Radiology Dg Chest 2 View  Result Date: 10/14/2017 CLINICAL DATA:  Worsening cough, shortness of breath and bilateral lower extremity pain over the past 2-3 days. EXAM: CHEST  2 VIEW COMPARISON:  CT chest 10/12/2017.  PA and lateral chest 10/11/2017. FINDINGS: Lungs clear. Heart size normal. No pneumothorax or pleural effusion. No bony abnormality. IMPRESSION: Normal chest. Electronically Signed   By: Drusilla Kannerhomas  Dalessio M.D.   On: 10/14/2017 10:43    Procedures Procedures (including critical care time)  Medications Ordered in ED Medications - No data to display   Initial Impression / Assessment and Plan / ED Course  I have reviewed the triage vital signs and the nursing notes.  Pertinent labs & imaging results that were available during my care of the patient were reviewed by me and considered in my medical decision making (see chart for details).     Patient presenting for evaluation of left lower leg pain with continued chest pain shortness of breath.  Evaluated in the ER 2 days ago for PE rule out.  No lower leg pain ultrasound was performed at  that time.  Ultrasound negative for DVT.  As patient has not had change in symptoms, doubt new PE in the past 2 days.  Discussed with patient, and she agrees to not perform CT to rule out PE today.  Physical exam shows nasal mucosal edema, swollen tonsils, she reports sinus pressure.  Likely viral illness.  Patient is Artie on prednisone.  Will prescribe Flonase to help with postnasal drip and cough.  Patient states she is an appointment primary care tomorrow.  Case discussed with attending, Dr. Rodena MedinMessick evaluated the patient.  At this time, patient appears safe for discharge, doubt ACS, PE, endocarditis, myocarditis, infection, PNA, or other emergent cause of sxs.  Return precautions given.  Patient states she understands and agrees to plan.   Final Clinical Impressions(s) / ED Diagnoses   Final diagnoses:  Upper respiratory tract infection, unspecified type    ED Discharge Orders        Ordered    fluticasone (FLONASE) 50 MCG/ACT nasal spray  Daily     10/14/17 1440       Alveria ApleyCaccavale, Sarenity Ramaker, PA-C 10/14/17 1823    Wynetta FinesMessick, Peter C, MD 10/27/17 [phone]

## 2017-10-14 NOTE — ED Triage Notes (Signed)
Per pt, states she was worked up for the same symptoms on the 16th-states she has a history of PE's-states she did not get an US of her left leg-states she is still having pain and cant see her PCP until tomorrow-wants to make sure she doesn't have a DVT or PE

## 2017-10-14 NOTE — Progress Notes (Signed)
Left lower extremity venous duplex has been completed. Negative for DVT. Results were given to Dr. Rodena MedinMessick.  10/14/17 2:21 PM Olen CordialGreg Keviana Guida RVT

## 2017-10-14 NOTE — Discharge Instructions (Signed)
Follow-up with your primary care doctor tomorrow as scheduled. Use Flonase to help with sinus pressure and postnasal drip. Make sure you are staying well-hydrated with water. Return to the emergency room if you develop persistent high fevers, wheezing not improved with inhaler, or any new or worsening symptoms.

## 2017-10-16 LAB — CULTURE, GROUP A STREP (THRC)

## 2017-10-17 ENCOUNTER — Telehealth: Payer: Self-pay | Admitting: *Deleted

## 2017-10-17 NOTE — Progress Notes (Signed)
ED Antimicrobial Stewardship Positive Culture Follow Up   Laurie Fields is an 31 y.o. female [facility] with a chief complaint of  Chief Complaint  Patient presents with  . Headache  . Chest Pain  . Leg Pain    Recent Results (from the past 720 hour(s))  Rapid strep screen     Status: None   Collection Time: 10/14/17  1:54 PM  Result Value Ref Range Status   Streptococcus, Group A Screen (Direct) NEGATIVE NEGATIVE Final    Comment: (NOTE) A Rapid Antigen test may result negative if the antigen level in the sample is below the detection level of this test. The FDA has not cleared this test as a stand-alone test therefore the rapid antigen negative result has reflexed to a Group A Strep culture.   Culture, group A strep     Status: None   Collection Time: 10/14/17  1:54 PM  Result Value Ref Range Status   Specimen Description THROAT  Final   Special Requests NONE Reflexed from [id number]  Final   Culture MODERATE GROUP A STREP (S.PYOGENES) ISOLATED  Final   Report Status 10/16/2017 FINAL  Final    []  Treated with N/A, organism resistant to prescribed antimicrobial [x]  Patient discharged originally without antimicrobial agent and treatment is now indicated  New antibiotic prescription: If pt is symptomatic, start amoxicillin 500mg  PO BID x 10 days  ED Provider: Michela PitcherMina Fawze, PA   [name], Drake Leachachel Lynn 10/17/2017, 9:16 AM Clinical Pharmacist Phone# [phone]

## 2017-10-17 NOTE — Telephone Encounter (Signed)
Post ED Visit - Positive Culture Follow-up: Unsuccessful Patient Follow-up  Culture assessed and recommendations reviewed by:  []  Enzo BiNathan Batchelder, Pharm.D. []  Celedonio MiyamotoJeremy Frens, Pharm.D., BCPS AQ-ID []  Garvin FilaMike Maccia, Pharm.D., BCPS []  Georgina PillionElizabeth Martin, Pharm.D., BCPS []  Wake ForestMinh Pham, VermontPharm.D., BCPS, AAHIVP []  Estella HuskMichelle Turner, Pharm.D., BCPS, AAHIVP []  Lysle Pearlachel Rumbarger, PharmD, BCPS []  Casilda Carlsaylor Stone, PharmD, BCPS []  Pollyann SamplesAndy Johnston, PharmD, BCPS  Positive strep culture, reviewed by Michela PitcherMina Fawze, PA-C  [x]  Patient discharged without antimicrobial prescription and treatment is now indicated if patient remains symptomatic, Amoxicillin 500mg  PO BID x 10 days []  Organism is resistant to prescribed ED discharge antimicrobial []  Patient with positive blood cultures   Unable to contact patient after 3 attempts, letter will be sent to address on file  Lysle PearlRobertson, Laurie Fields 10/17/2017, 9:29 AM

## 2017-10-25 ENCOUNTER — Ambulatory Visit
Admission: RE | Admit: 2017-10-25 | Discharge: 2017-10-25 | Disposition: A | Discharge status: Home / Self Care | Payer: Medicare Other | Source: Ambulatory Visit | Attending: Neurology | Admitting: Neurology

## 2017-10-25 VITALS — BP 110/77 | HR 71

## 2017-10-25 DIAGNOSIS — G43709 Chronic migraine without aura, not intractable, without status migrainosus: Secondary | ICD-10-CM

## 2017-10-25 DIAGNOSIS — IMO0002 Reserved for concepts with insufficient information to code with codable children: Secondary | ICD-10-CM

## 2017-10-25 DIAGNOSIS — G932 Benign intracranial hypertension: Secondary | ICD-10-CM

## 2017-10-25 LAB — GRAM STAIN
MICRO NUMBER:: 81347964
SPECIMEN QUALITY:: ADEQUATE

## 2017-10-25 MED ORDER — ONDANSETRON 4 MG PO TBDP
4.0000 mg | ORAL_TABLET | Freq: Once | ORAL | Status: AC
  Administered 2017-10-25: 4 mg via ORAL

## 2017-10-25 NOTE — Discharge Instructions (Signed)

## 2017-10-28 ENCOUNTER — Telehealth: Payer: Self-pay | Admitting: Neurology

## 2017-10-28 ENCOUNTER — Ambulatory Visit (INDEPENDENT_AMBULATORY_CARE_PROVIDER_SITE_OTHER): Payer: Self-pay

## 2017-10-28 DIAGNOSIS — Z0289 Encounter for other administrative examinations: Secondary | ICD-10-CM

## 2017-10-28 DIAGNOSIS — R55 Syncope and collapse: Secondary | ICD-10-CM

## 2017-10-28 DIAGNOSIS — G43709 Chronic migraine without aura, not intractable, without status migrainosus: Secondary | ICD-10-CM

## 2017-10-28 DIAGNOSIS — IMO0002 Reserved for concepts with insufficient information to code with codable children: Secondary | ICD-10-CM

## 2017-10-28 DIAGNOSIS — G932 Benign intracranial hypertension: Secondary | ICD-10-CM

## 2017-10-28 NOTE — Telephone Encounter (Signed)
Spoke to patient - she is aware of results and reports temporary relief of headache.  She has continued taking Topamax 100mg , BID.  She has not tried a recent dose of rizatriptan.  Per vo by Dr. Terrace ArabiaYan, she can try an oral cocktail at home of the following: 1) Benadryl 25mg  2) Aleve 200mg  3) ondansetron 8mg  4) rizatriptan 5mg  She should lay down to rest in a quiet, dark room.  She may repeat the rizatriptan 5mg  in two hours, if needed.  She is agreeable to this plan and will call back if migraine persist.

## 2017-10-28 NOTE — Telephone Encounter (Signed)
Please call patient, lumbar puncture on October 25, 2017 showed elevated opening pressure 32 cm water, spinal fluid testing was normal,  Hope lumbar puncture has helped her headache

## 2017-10-29 ENCOUNTER — Encounter: Payer: Self-pay | Admitting: Neurology

## 2017-10-29 ENCOUNTER — Other Ambulatory Visit (INDEPENDENT_AMBULATORY_CARE_PROVIDER_SITE_OTHER): Payer: Self-pay | Admitting: Neurology

## 2017-10-29 DIAGNOSIS — Z0289 Encounter for other administrative examinations: Secondary | ICD-10-CM

## 2017-10-29 LAB — CSF CELL COUNT WITH DIFFERENTIAL
RBC COUNT CSF: 0 {cells}/uL (ref 0–10)
WBC CSF: 0 {cells}/uL (ref 0–5)

## 2017-10-29 LAB — VDRL, CSF: VDRL Quant, CSF: NONREACTIVE

## 2017-10-29 LAB — GLUCOSE, CSF: Glucose, CSF: 55 mg/dL (ref 40–80)

## 2017-10-29 LAB — PROTEIN, CSF: Total Protein, CSF: 24 mg/dL (ref 15–45)

## 2017-10-31 ENCOUNTER — Telehealth: Payer: Self-pay

## 2017-10-31 NOTE — Telephone Encounter (Signed)
Kevia with [facility] called with results. They can be seen in epic as well.   Fungal smear results - no fungal element seen  Culture - still in progress

## 2017-11-08 NOTE — Procedures (Signed)
   HISTORY: 31 years old female, complains of passing out episodes  TECHNIQUE:  16 channel EEG was performed based on standard 10-16 international system. One channel was dedicated to EKG, which has demonstrates normal sinus rhythm of 72 beats per minutes.  Upon awakening, the posterior background activity was well-developed, in alpha range, with amplitude of microvoltage, reactive to eye opening and closure.  There was no evidence of epileptiform discharge.  Photic stimulation was performed, which induced a symmetric photic driving.  Hyperventilation was performed, there was no abnormality elicit.  Stage II sleep was achieved.  CONCLUSION: This is a  normal awake and asleep EEG.  There is no electrodiagnostic evidence of epileptiform discharge.  Levert FeinsteinYijun Dymon Summerhill, M.D. Ph.D.  [facility] [address] [place], [place] [address] Phone: [phone] Fax:      [phone]

## 2017-11-11 ENCOUNTER — Telehealth: Payer: Self-pay | Admitting: *Deleted

## 2017-11-11 NOTE — Telephone Encounter (Signed)
Left patient a detailed message, with results, on voicemail (ok per DPR).  Provided our number to call back with any questions.  

## 2017-11-11 NOTE — Telephone Encounter (Signed)
-----   Message from Levert FeinsteinYijun Yan, MD sent at 11/11/2017  2:09 PM EST ----- Please call patient for normal csf study

## 2017-11-17 IMAGING — US US PELVIS COMPLETE
1 series · 13 of 25 positions shown · IV contrast (agent unspecified)
Comparison: None

ADDENDUM:
Correction, the recommended follow-up imaging and impression #2
should read:
"Pelvis MRI without and with IV contrast (Gyn pelvis protocol)..."

Study discussed by telephone with PA FIQRET RABUSHJA on 04/18/2016 at
2552 hours.
CLINICAL DATA: 29-year-old female with left greater than right
pelvic pain for 3 days. Initial encounter. Personal history of
hysterectomy.
EXAM:
TRANSABDOMINAL AND TRANSVAGINAL ULTRASOUND OF PELVIS
TECHNIQUE: Both transabdominal and transvaginal ultrasound examinations of the
pelvis were performed. Transabdominal technique was performed for
global imaging of the pelvis including uterus, ovaries, adnexal
regions, and pelvic cul-de-sac. It was necessary to proceed with
endovaginal exam following the transabdominal exam to visualize the
ovaries.

[Series 1: us pelvis complete · 0.21mm/px · 13 of 102 slices shown]
[im 1/102]
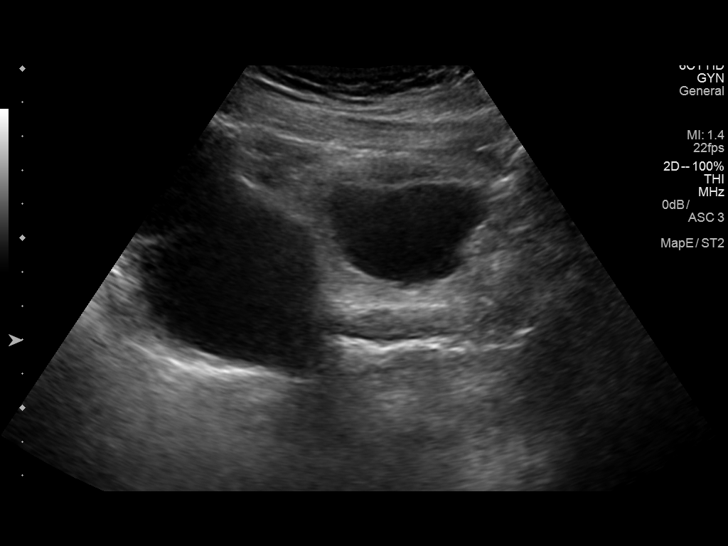
[im 9/102]
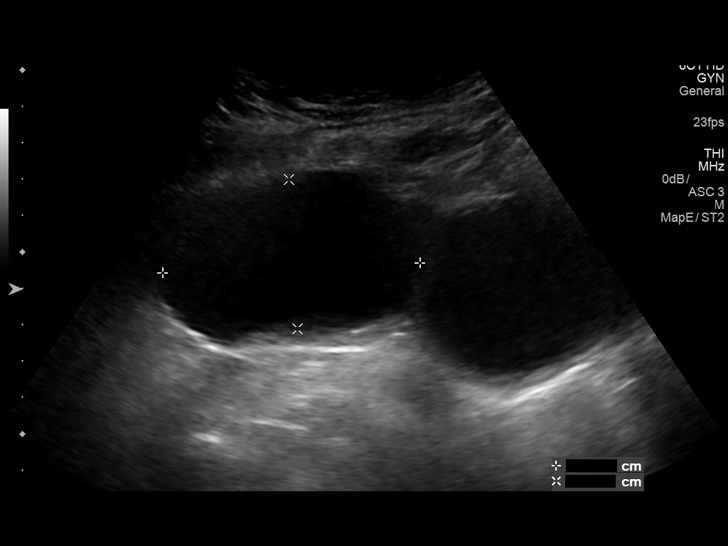
[im 17/102]
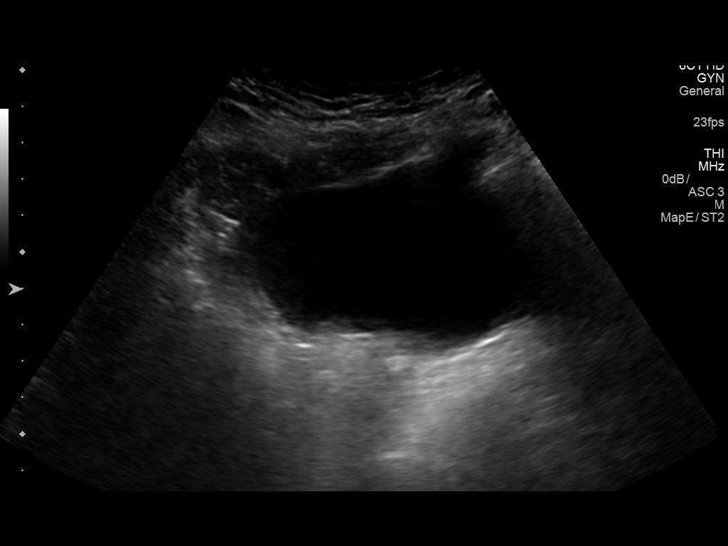
[im 26/102]
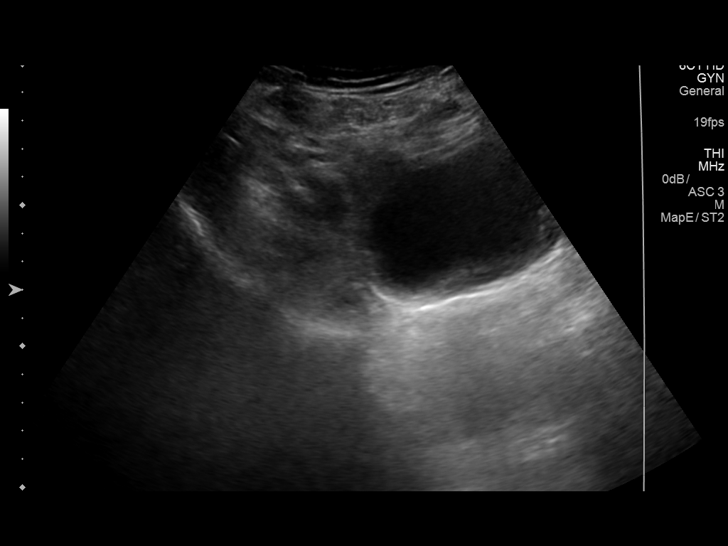
[im 34/102]
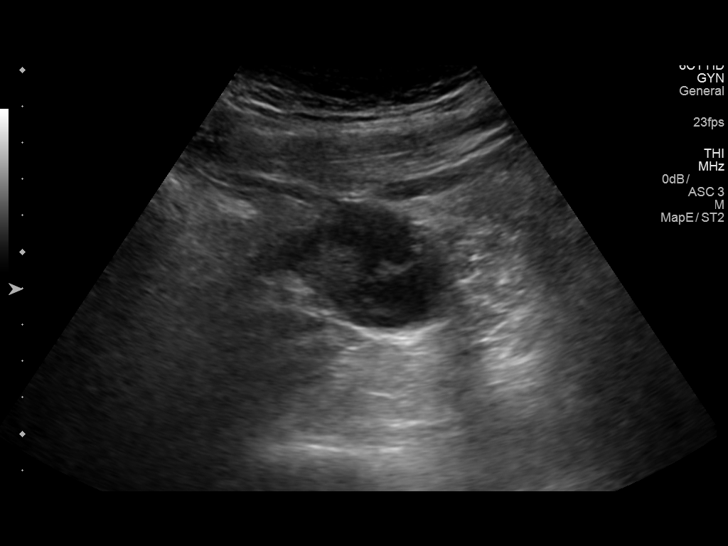
[im 43/102]
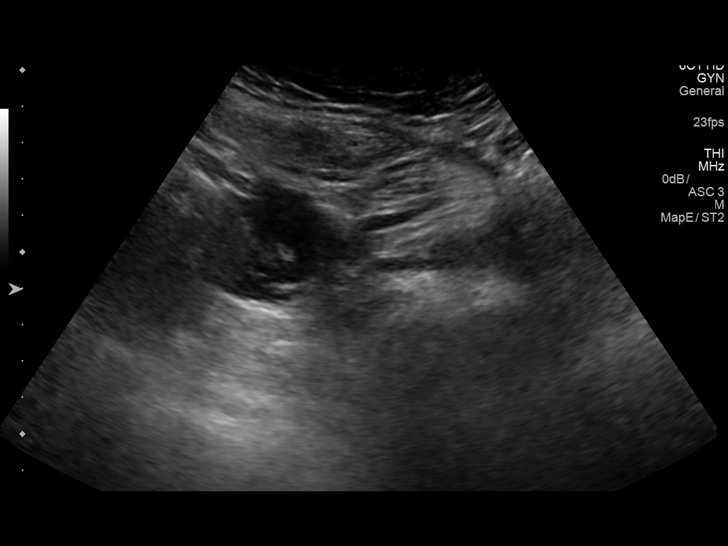
[im 51/102]
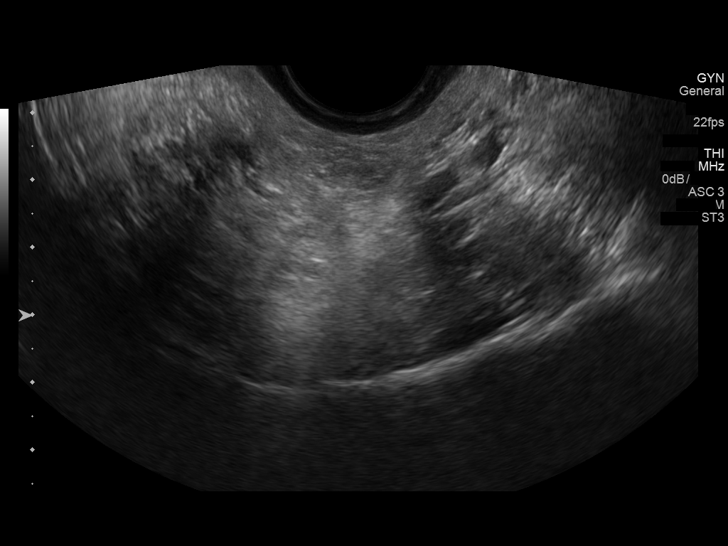
[im 59/102]
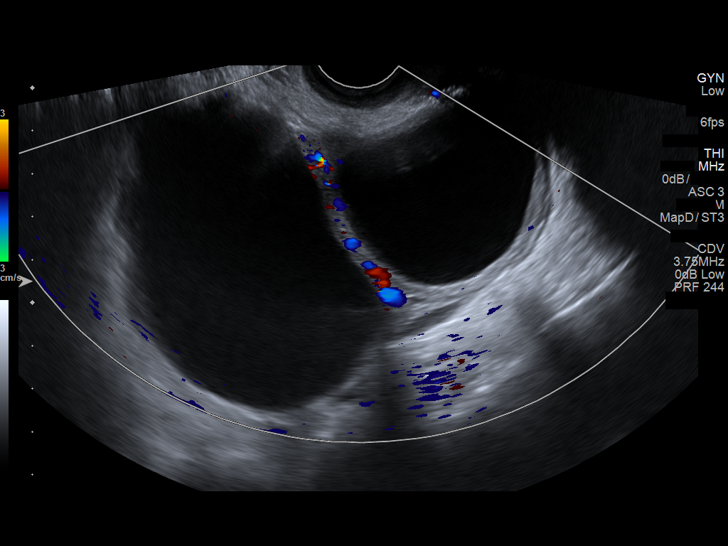
[im 68/102]
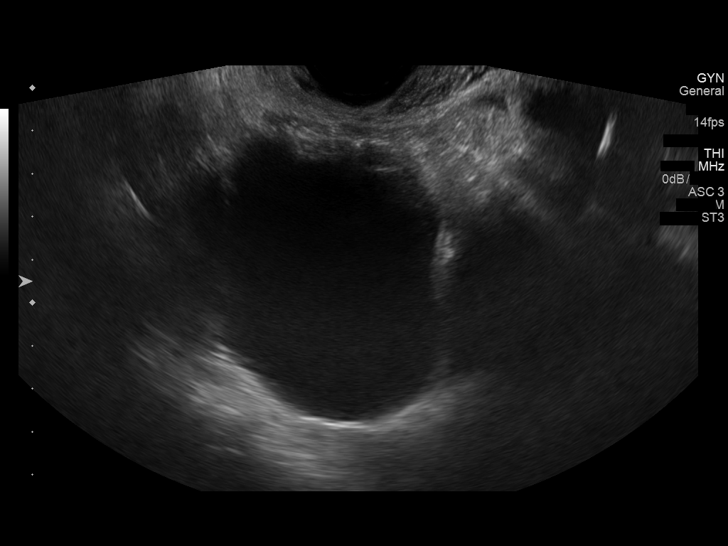
[im 76/102]
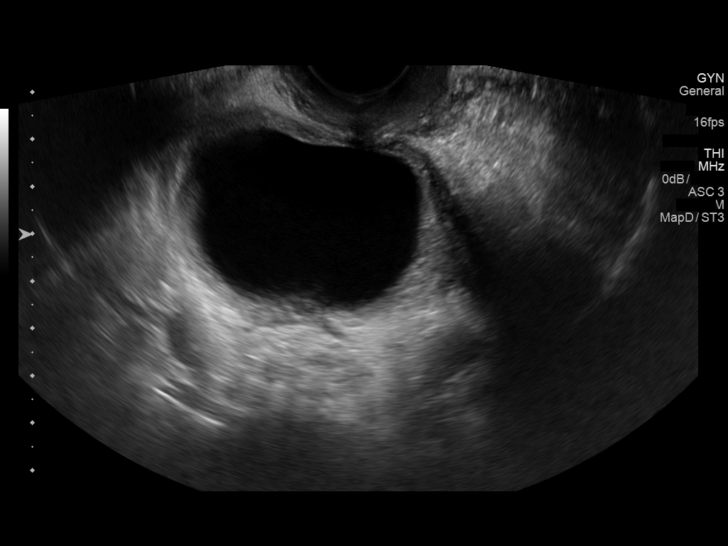
[im 85/102]
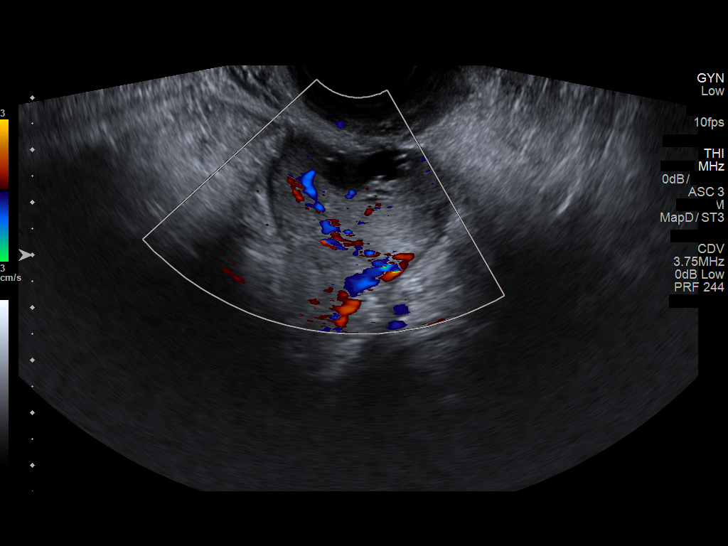
[im 93/102]
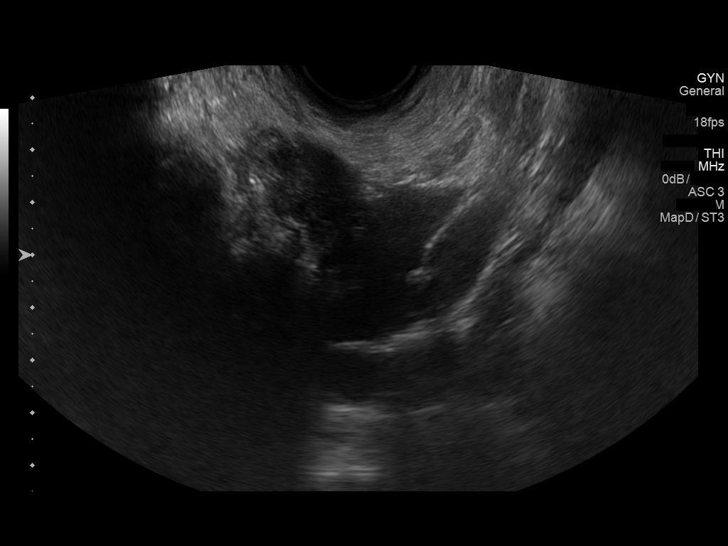
[im 102/102]
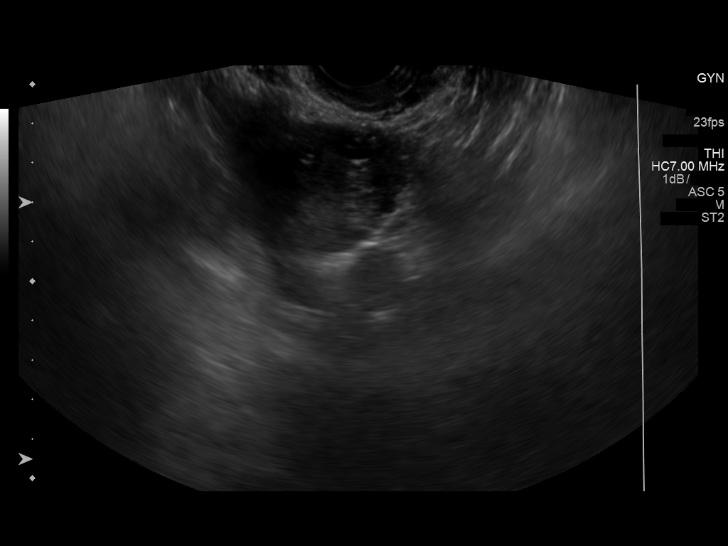

[13 of 25 positions shown; findings below may reference images not displayed]

FINDINGS: Uterus

Measurements: Surgically absent.

Endometrium

Thickness: Surgically absent.

Right ovary

Measurements: 10.8 x 7.6 x 7.8 cm. Large bilobed/septated versus
adjacent adnexal cysts encompassing 5.1-7.6 cm individually.
Intervening parenchyma or septation with vascularity (image 60). No
other internal architecture.

Left ovary

Measurements: 3.4 x 3.4 x 2.5 cm. There is a 4.1 cm hypoechoic area
with homogeneous low level internal echoes (image 92). No definite
associated vascularity.

Other findings

Trace simple appearing free fluid.
IMPRESSION: 1. Surgically absent uterus.
2. Abnormalities of both ovaries. Overall followup pelvis ultrasound
without and with IV contrast (gyn pelvis protocol) to characterize
further may be most valuable at this point.
On the right cystic lesions individually up to 7.6 cm with
indeterminate but probably benign characteristics are noted. While
on the left there is a 4.1 cm hypoechoic area which most resembles
an endometrioma.
3. Trace simple appearing pelvic free fluid.

## 2017-11-22 LAB — FUNGUS CULTURE W SMEAR
MICRO NUMBER: 81347962
SMEAR: NONE SEEN
SPECIMEN QUALITY: ADEQUATE

## 2017-12-30 ENCOUNTER — Other Ambulatory Visit: Payer: Self-pay

## 2017-12-30 ENCOUNTER — Encounter (HOSPITAL_COMMUNITY): Payer: Self-pay | Admitting: Emergency Medicine

## 2017-12-30 ENCOUNTER — Ambulatory Visit (HOSPITAL_COMMUNITY)
Admission: EM | Admit: 2017-12-30 | Discharge: 2017-12-30 | Disposition: A | Discharge status: Home / Self Care | Payer: Medicare Other | Attending: Internal Medicine | Admitting: Internal Medicine

## 2017-12-30 DIAGNOSIS — J45909 Unspecified asthma, uncomplicated: Secondary | ICD-10-CM | POA: Insufficient documentation

## 2017-12-30 DIAGNOSIS — R300 Dysuria: Secondary | ICD-10-CM | POA: Insufficient documentation

## 2017-12-30 DIAGNOSIS — Z90710 Acquired absence of both cervix and uterus: Secondary | ICD-10-CM | POA: Insufficient documentation

## 2017-12-30 DIAGNOSIS — G43909 Migraine, unspecified, not intractable, without status migrainosus: Secondary | ICD-10-CM | POA: Diagnosis not present

## 2017-12-30 DIAGNOSIS — R109 Unspecified abdominal pain: Secondary | ICD-10-CM | POA: Diagnosis not present

## 2017-12-30 DIAGNOSIS — R3915 Urgency of urination: Secondary | ICD-10-CM

## 2017-12-30 DIAGNOSIS — F329 Major depressive disorder, single episode, unspecified: Secondary | ICD-10-CM | POA: Insufficient documentation

## 2017-12-30 DIAGNOSIS — R102 Pelvic and perineal pain: Secondary | ICD-10-CM | POA: Diagnosis not present

## 2017-12-30 DIAGNOSIS — Z8249 Family history of ischemic heart disease and other diseases of the circulatory system: Secondary | ICD-10-CM | POA: Diagnosis not present

## 2017-12-30 DIAGNOSIS — R350 Frequency of micturition: Secondary | ICD-10-CM

## 2017-12-30 DIAGNOSIS — E669 Obesity, unspecified: Secondary | ICD-10-CM | POA: Insufficient documentation

## 2017-12-30 DIAGNOSIS — R1010 Upper abdominal pain, unspecified: Secondary | ICD-10-CM | POA: Diagnosis not present

## 2017-12-30 DIAGNOSIS — Z3202 Encounter for pregnancy test, result negative: Secondary | ICD-10-CM

## 2017-12-30 DIAGNOSIS — G932 Benign intracranial hypertension: Secondary | ICD-10-CM | POA: Diagnosis not present

## 2017-12-30 DIAGNOSIS — Z8742 Personal history of other diseases of the female genital tract: Secondary | ICD-10-CM

## 2017-12-30 DIAGNOSIS — Z90711 Acquired absence of uterus with remaining cervical stump: Secondary | ICD-10-CM

## 2017-12-30 LAB — POCT URINALYSIS DIP (DEVICE)
Bilirubin Urine: NEGATIVE
Glucose, UA: NEGATIVE mg/dL
Ketones, ur: NEGATIVE mg/dL
LEUKOCYTES UA: NEGATIVE
Nitrite: NEGATIVE
Protein, ur: NEGATIVE mg/dL
Urobilinogen, UA: 1 mg/dL (ref 0.0–1.0)
pH: 6.5 (ref 5.0–8.0)

## 2017-12-30 LAB — POCT PREGNANCY, URINE: Preg Test, Ur: NEGATIVE

## 2017-12-30 MED ORDER — METRONIDAZOLE 0.75 % VA GEL: 1.0000 | Freq: Two times a day (BID) | VAGINAL | 0 refills | Status: DC

## 2017-12-30 MED ORDER — SULFAMETHOXAZOLE-TRIMETHOPRIM 800-160 MG PO TABS: 1.0000 | ORAL_TABLET | Freq: Two times a day (BID) | ORAL | 0 refills | Status: DC

## 2017-12-30 NOTE — ED Triage Notes (Signed)
Pt c/o urinary symptoms, burning, frequency, urgency with urination, also c/o bilateral flank pain x1 week.

## 2017-12-30 NOTE — ED Provider Notes (Signed)
    MRN: [id number] DOB: 01/16/1986  Subjective:   Fonnie MuVictoria Mcbrayer is a 32 y.o. female presenting for 1 week history of dysuria, urinary frequency, urinary urgency, flank pain, pelvic pain and cloudy malordorous urine. Has tried hydrating aggressively, drinking cranberry juice. Has a history of ovarian cysts, had partial hysterectomy as a result. Has had difficulty with BV since her surgery too. Denies fever, hematuria and abdominal pain, nausea and vomiting. Denies smoking cigarettes.  [place] is allergic to serevent [salmeterol]. [place]  has a past medical history of Asthma, Blurred vision, Depression, Migraine, Obesity, Ovarian cyst, Pseudotumor cerebri, and Vertigo. Also  has a past surgical history that includes Abdominal hysterectomy; Cesarean section; carpel  tunnel release; and peritoneal cyst. Her family history includes CAD in her other; Cancer in her other; Cataracts in her maternal grandfather and paternal grandfather; Colon polyps in her maternal grandmother; Diabetes in her maternal grandmother, other, and paternal grandmother; Glaucoma in her maternal grandfather; Luiz BlareGraves' disease in her mother; Hypertension in her father and other; Leukemia in her other; Other in her mother; Prostate cancer in her maternal grandfather and paternal grandfather.   Objective:   Vitals: BP (!) 143/91   Pulse 66   Temp 98.3 F (36.8 C)   Resp 18   SpO2 100%   Physical Exam  Constitutional: She is oriented to person, place, and time. She appears well-developed and well-nourished.  Cardiovascular: Normal rate, regular rhythm and intact distal pulses. Exam reveals no gallop and no friction rub.  No murmur heard. Pulmonary/Chest: No respiratory distress. She has no wheezes. She has no rales.  Abdominal: She exhibits no distension and no mass. There is tenderness (generalized throughout and mild). There is no guarding.  Mild bilateral CVA tenderness.  Genitourinary:  Genitourinary Comments:  Declined pelvic exam and would like to await test result and try medication first.  Neurological: She is alert and oriented to person, place, and time.  Skin: Skin is warm and dry.   Results for orders placed or performed during the hospital encounter of 12/30/17 (from the past 24 hour(s))  POCT urinalysis dip (device)     Status: Abnormal   Collection Time: 12/30/17  8:52 PM  Result Value Ref Range   Glucose, UA NEGATIVE NEGATIVE mg/dL   Bilirubin Urine NEGATIVE NEGATIVE   Ketones, ur NEGATIVE NEGATIVE mg/dL   Specific Gravity, Urine >=1.030 1.005 - 1.030   Hgb urine dipstick TRACE (A) NEGATIVE   pH 6.5 5.0 - 8.0   Protein, ur NEGATIVE NEGATIVE mg/dL   Urobilinogen, UA 1.0 0.0 - 1.0 mg/dL   Nitrite NEGATIVE NEGATIVE   Leukocytes, UA NEGATIVE NEGATIVE  Pregnancy, urine POC     Status: None   Collection Time: 12/30/17  8:59 PM  Result Value Ref Range   Preg Test, Ur NEGATIVE NEGATIVE   Assessment and Plan :   Dysuria  Urinary frequency  Urinary urgency  Bilateral flank pain  Pelvic pain in female  History of ovarian cyst  History of partial hysterectomy  Patient was insistent on getting covered for BV. I counseled that we could do pelvic but she prefers to get treated and await test results. Will cover for cystitis given urinary symptoms, bilateral CVA tenderness. Counseled on possibility of having ovarian cysts still given partial hysterectomy and her history of cysts. Return-to-clinic precautions discussed, patient verbalized understanding. Urine culture pending.   Wallis BambergMani, Trei Schoch, [place] JerseyPA-C 12/30/17 [id number]

## 2017-12-31 LAB — URINE CYTOLOGY ANCILLARY ONLY: Trichomonas: NEGATIVE

## 2018-01-01 LAB — URINE CULTURE

## 2018-01-02 ENCOUNTER — Telehealth (HOSPITAL_COMMUNITY): Payer: Self-pay | Admitting: Emergency Medicine

## 2018-01-02 LAB — URINE CYTOLOGY ANCILLARY ONLY: Candida vaginitis: NEGATIVE

## 2018-01-02 MED ORDER — METRONIDAZOLE 500 MG PO TABS: 500.0000 mg | ORAL_TABLET | Freq: Two times a day (BID) | ORAL | 0 refills | Status: DC

## 2018-01-02 NOTE — Telephone Encounter (Signed)
Pt called needing results...Marland Kitchen.Marland Kitchen. Pt ID'd properly Reports feeling better and sx are subsiding Sts Medicare will not pay for Gel giving for BV  Wants to know if we can call in Flagyl.... Order from Dr. Dayton ScrapeMurray placed on 01/02/18 Rx sent to [facility] on [address] per pt's request.  Adv pt if sx are not getting better to return or to f/u w/PCP Education on safe sex given Notified pt that lab results can be obtained through MyChart Pt verb understanding.

## 2018-01-05 ENCOUNTER — Encounter (HOSPITAL_COMMUNITY): Payer: Self-pay | Admitting: *Deleted

## 2018-01-05 ENCOUNTER — Emergency Department (HOSPITAL_COMMUNITY)
Admission: EM | Admit: 2018-01-05 | Discharge: 2018-01-06 | Disposition: A | Discharge status: Home / Self Care | Payer: Medicare Other | Attending: Emergency Medicine | Admitting: Emergency Medicine

## 2018-01-05 DIAGNOSIS — R102 Pelvic and perineal pain: Secondary | ICD-10-CM | POA: Diagnosis not present

## 2018-01-05 DIAGNOSIS — R1012 Left upper quadrant pain: Secondary | ICD-10-CM | POA: Insufficient documentation

## 2018-01-05 DIAGNOSIS — Z79899 Other long term (current) drug therapy: Secondary | ICD-10-CM | POA: Insufficient documentation

## 2018-01-05 DIAGNOSIS — J45909 Unspecified asthma, uncomplicated: Secondary | ICD-10-CM | POA: Insufficient documentation

## 2018-01-05 DIAGNOSIS — R1011 Right upper quadrant pain: Secondary | ICD-10-CM | POA: Diagnosis present

## 2018-01-05 DIAGNOSIS — N739 Female pelvic inflammatory disease, unspecified: Secondary | ICD-10-CM | POA: Diagnosis not present

## 2018-01-05 DIAGNOSIS — N730 Acute parametritis and pelvic cellulitis: Secondary | ICD-10-CM

## 2018-01-05 LAB — CBC
HCT: 39.2 % (ref 36.0–46.0)
HEMOGLOBIN: 12.9 g/dL (ref 12.0–15.0)
MCH: 28.5 pg (ref 26.0–34.0)
MCHC: 32.9 g/dL (ref 30.0–36.0)
MCV: 86.7 fL (ref 78.0–100.0)
Platelets: 374 10*3/uL (ref 150–400)
RBC: 4.52 MIL/uL (ref 3.87–5.11)
RDW: 13.6 % (ref 11.5–15.5)
WBC: 8.6 10*3/uL (ref 4.0–10.5)

## 2018-01-05 LAB — BASIC METABOLIC PANEL
ANION GAP: 4 — AB (ref 5–15)
BUN: 12 mg/dL (ref 6–20)
CHLORIDE: 105 mmol/L (ref 101–111)
CO2: 28 mmol/L (ref 22–32)
CREATININE: 0.99 mg/dL (ref 0.44–1.00)
Calcium: 8.8 mg/dL — ABNORMAL LOW (ref 8.9–10.3)
GFR calc non Af Amer: >60 mL/min (ref >60)
GLUCOSE: 84 mg/dL (ref 65–99)
Potassium: 3.6 mmol/L (ref 3.5–5.1)
Sodium: 137 mmol/L (ref 135–145)

## 2018-01-05 LAB — URINALYSIS, ROUTINE W REFLEX MICROSCOPIC
GLUCOSE, UA: NEGATIVE mg/dL
Hgb urine dipstick: NEGATIVE
KETONES UR: NEGATIVE mg/dL
Leukocytes, UA: NEGATIVE
NITRITE: NEGATIVE
PH: 6.5 (ref 5.0–8.0)
Protein, ur: NEGATIVE mg/dL
SPECIFIC GRAVITY, URINE: 1.025 (ref 1.005–1.030)

## 2018-01-05 NOTE — ED Triage Notes (Signed)
Pt reports bilateral flank pain, burning with urination, n/v, pelvic pain, unsure of fevers. Pt states she was seen at urgent care for the same last week and was given bactrim and flagyl x 4 days (dx with bacterial vaginosis), but symptoms are not any better. No pain meds PTA.

## 2018-01-06 ENCOUNTER — Encounter (HOSPITAL_COMMUNITY): Payer: Self-pay

## 2018-01-06 ENCOUNTER — Emergency Department (HOSPITAL_COMMUNITY): Payer: Medicare Other

## 2018-01-06 LAB — GC/CHLAMYDIA PROBE AMP (~~LOC~~) NOT AT ARMC
Chlamydia: NEGATIVE
Neisseria Gonorrhea: NEGATIVE

## 2018-01-06 LAB — WET PREP, GENITAL
CLUE CELLS WET PREP: NONE SEEN
SPERM: NONE SEEN
Trich, Wet Prep: NONE SEEN
YEAST WET PREP: NONE SEEN

## 2018-01-06 LAB — PREGNANCY, URINE: Preg Test, Ur: NEGATIVE

## 2018-01-06 MED ORDER — DOXYCYCLINE HYCLATE 100 MG PO CAPS: 100.0000 mg | ORAL_CAPSULE | Freq: Two times a day (BID) | ORAL | 0 refills | Status: DC

## 2018-01-06 MED ORDER — ONDANSETRON 4 MG PO TBDP: 4.0000 mg | ORAL_TABLET | Freq: Three times a day (TID) | ORAL | 0 refills | Status: DC | PRN

## 2018-01-06 MED ORDER — ONDANSETRON HCL 4 MG/2ML IJ SOLN
4.0000 mg | Freq: Once | INTRAMUSCULAR | Status: AC
  Administered 2018-01-06: 4 mg via INTRAVENOUS
  Filled 2018-01-06: qty 2

## 2018-01-06 MED ORDER — IOPAMIDOL (ISOVUE-300) INJECTION 61%
INTRAVENOUS | Status: AC
  Administered 2018-01-06: 100 mL via INTRAVENOUS
  Filled 2018-01-06: qty 100

## 2018-01-06 MED ORDER — SODIUM CHLORIDE 0.9 % IJ SOLN
INTRAMUSCULAR | Status: AC
  Administered 2018-01-06: 02:00:00
  Filled 2018-01-06: qty 50

## 2018-01-06 MED ORDER — DEXTROSE 5 % IV SOLN
1.0000 g | Freq: Once | INTRAVENOUS | Status: AC
  Administered 2018-01-06: 1 g via INTRAVENOUS
  Filled 2018-01-06: qty 10

## 2018-01-06 MED ORDER — CEFTRIAXONE SODIUM 250 MG IJ SOLR: 250.0000 mg | Freq: Once | INTRAMUSCULAR | Status: DC

## 2018-01-06 MED ORDER — SODIUM CHLORIDE 0.9 % IV BOLUS (SEPSIS)
1000.0000 mL | Freq: Once | INTRAVENOUS | Status: AC
  Administered 2018-01-06: 1000 mL via INTRAVENOUS

## 2018-01-06 MED ORDER — MORPHINE SULFATE (PF) 4 MG/ML IV SOLN
4.0000 mg | Freq: Once | INTRAVENOUS | Status: AC
  Administered 2018-01-06: 4 mg via INTRAVENOUS
  Filled 2018-01-06: qty 1

## 2018-01-06 MED ORDER — IOPAMIDOL (ISOVUE-300) INJECTION 61%
100.0000 mL | Freq: Once | INTRAVENOUS | Status: AC | PRN
  Administered 2018-01-06: 100 mL via INTRAVENOUS

## 2018-01-06 NOTE — Discharge Instructions (Signed)
Continue Your medications as prescribed.  Follow-up with your gynecologist.  Use condoms when having sex.  Return to the ED if you develop new or worsening symptoms.

## 2018-01-06 NOTE — ED Notes (Signed)
Patient transported to CT 

## 2018-01-06 NOTE — ED Provider Notes (Signed)
[facility] Note   CSN: [id number] Arrival date & time: 01/05/18  1952     History   Chief Complaint Chief Complaint  Patient presents with  . Flank Pain    HPI Laurie Fields is a 32 y.o. female.  Patient states 1 week history of bilateral flank pain, burning with urination, lower pelvic pain and low back pain.  She was seen in urgent care in February 4 and declined pelvic exam.  She was given Bactrim and Flagyl which she is still taking for possible UTI and bacterial vaginosis.  She states she was told to come back if her symptoms are not improving.  She denies fever.  She does have some pain with urination.  No vaginal bleeding or discharge.  No recent sexual activity.  States she has been vomiting about 1-2 times a day for the past week does not want to eat.  Denies any blood in her urine.  She denies any diarrhea.  Her urine culture from February 4 is negative   The history is provided by the patient.  Flank Pain  Associated symptoms include abdominal pain. Pertinent negatives include no chest pain, no headaches and no shortness of breath.    Past Medical History:  Diagnosis Date  . Asthma   . Blurred vision   . Depression   . Migraine   . Obesity   . Ovarian cyst   . Pseudotumor cerebri   . Vertigo     Patient Active Problem List   Diagnosis Date Noted  . Chronic migraine 10/08/2017  . Pseudotumor cerebri 10/08/2017    Past Surgical History:  Procedure Laterality Date  . ABDOMINAL HYSTERECTOMY    . carpel  tunnel release    . CESAREAN SECTION    . peritoneal cyst      OB History    No data available       Home Medications    Prior to Admission medications   Medication Sig Start Date End Date Taking? Authorizing Provider  albuterol (PROVENTIL HFA;VENTOLIN HFA) 108 (90 Base) MCG/ACT inhaler Inhale 2 puffs into the lungs every 6 (six) hours as needed for wheezing or shortness of breath.    [provider]  diazepam (VALIUM) 5 MG tablet Take 1 tablet (5 mg total) every 8 (eight) hours as needed by mouth. Patient not taking: Reported on 12/30/2017 10/02/17   Charlestine Night, PA-C  fluticasone [facility]) 50 MCG/ACT nasal spray Place 1 spray daily into both nostrils. 10/14/17   Caccavale, Sophia, PA-C  meclizine (ANTIVERT) 25 MG tablet Take 1 tablet (25 mg total) by mouth 3 (three) times daily as needed for dizziness. 09/26/17   Azalia Bilis, MD  metroNIDAZOLE (FLAGYL) 500 MG tablet Take 1 tablet (500 mg total) by mouth 2 (two) times daily. 01/02/18   Isa Rankin, MD  metroNIDAZOLE (METROGEL) 0.75 % vaginal gel Place 1 Applicatorful vaginally 2 (two) times daily. 12/30/17   Wallis Bamberg, PA-C  ondansetron (ZOFRAN ODT) 8 MG disintegrating tablet Take 1 tablet (8 mg total) every 8 (eight) hours as needed by mouth for nausea or vomiting. 10/08/17   Levert Feinstein, MD  predniSONE (DELTASONE) 20 MG tablet Take 2 tablets (40 mg total) daily by mouth. Patient not taking: Reported on 12/30/2017 10/12/17   Roxy Horseman, PA-C  rizatriptan (MAXALT-MLT) 5 MG disintegrating tablet Take 1 tablet (5 mg total) as needed by mouth. May repeat in 2 hours if needed Patient taking differently: Take 5 mg as  needed by mouth for migraine. May repeat in 2 hours if needed 10/08/17   Levert FeinsteinYan, Yijun, MD  sulfamethoxazole-trimethoprim (BACTRIM DS,SEPTRA DS) 800-160 MG tablet Take 1 tablet by mouth 2 (two) times daily. 12/30/17   Wallis BambergMani, Mario, PA-C  topiramate (TOPAMAX) 100 MG tablet Take 1 tablet (100 mg total) 2 (two) times daily by mouth. Titrating up to 100mg , BID. 10/08/17   Levert FeinsteinYan, Yijun, MD    Family History Family History  Problem Relation Age of Onset  . Hypertension Other   . Diabetes Other   . Cancer Other   . CAD Other   . Leukemia Other   . Graves' disease Mother   . Other Mother        low blood sugar  . Hypertension Father   . Diabetes Maternal Grandmother   . Colon polyps Maternal Grandmother   .  Glaucoma Maternal Grandfather   . Cataracts Maternal Grandfather   . Prostate cancer Maternal Grandfather   . Diabetes Paternal Grandmother   . Cataracts Paternal Grandfather   . Prostate cancer Paternal Grandfather     Social History Social History   Tobacco Use  . Smoking status: Never Smoker  . Smokeless tobacco: Never Used  Substance Use Topics  . Alcohol use: No  . Drug use: No     Allergies   Serevent [salmeterol]   Review of Systems Review of Systems  Constitutional: Positive for activity change and appetite change. Negative for fever.  HENT: Negative for congestion and rhinorrhea.   Eyes: Negative for visual disturbance.  Respiratory: Negative for cough, chest tightness and shortness of breath.   Cardiovascular: Negative for chest pain and leg swelling.  Gastrointestinal: Positive for abdominal pain, nausea and vomiting. Negative for diarrhea.  Genitourinary: Positive for dysuria and flank pain. Negative for hematuria, vaginal bleeding and vaginal discharge.  Musculoskeletal: Positive for back pain. Negative for arthralgias and myalgias.  Skin: Negative for rash.  Neurological: Negative for dizziness, weakness and headaches.    all other systems are negative except as noted in the HPI and PMH.    Physical Exam Updated Vital Signs BP 123/63 (BP Location: Left Arm)   Pulse 63   Temp 98.2 F (36.8 C) (Oral)   Resp 18   Ht 5\' 5"  (1.651 m)   Wt 130.6 kg (288 lb)   SpO2 100%   BMI 47.93 kg/m   Physical Exam  Constitutional: She is oriented to person, place, and time. She appears well-developed and well-nourished. No distress.  Obese. Non toxic appearing  HENT:  Head: Normocephalic and atraumatic.  Mouth/Throat: Oropharynx is clear and moist. No oropharyngeal exudate.  Eyes: Conjunctivae and EOM are normal. Pupils are equal, round, and reactive to light.  Neck: Normal range of motion. Neck supple.  No meningismus.  Cardiovascular: Normal rate, regular  rhythm, normal heart sounds and intact distal pulses.  No murmur heard. Pulmonary/Chest: Effort normal and breath sounds normal. No respiratory distress.  Abdominal: Soft. There is tenderness. There is no rebound and no guarding.  Mild suprapubic and left lower quadrant tenderness, no right lower quadrant tenderness, no guarding or rebound  Genitourinary:  Genitourinary Comments: Chaperone present.  Normal external genitalia.  Scant white discharge in vaginal vault.  Cervix absent.  Left-sided adnexal tenderness  Musculoskeletal: Normal range of motion. She exhibits no edema or tenderness.  Neurological: She is alert and oriented to person, place, and time. No cranial nerve deficit. She exhibits normal muscle tone. Coordination normal.  No ataxia on finger to  nose bilaterally. No pronator drift. 5/5 strength throughout. CN 2-12 intact.Equal grip strength. Sensation intact.   Skin: Skin is warm.  Psychiatric: She has a normal mood and affect. Her behavior is normal.  Nursing note and vitals reviewed.    ED Treatments / Results  Labs (all labs ordered are listed, but only abnormal results are displayed) Labs Reviewed  WET PREP, GENITAL - Abnormal; Notable for the following components:      Result Value   WBC, Wet Prep HPF POC RARE (*)    All other components within normal limits  URINALYSIS, ROUTINE W REFLEX MICROSCOPIC - Abnormal; Notable for the following components:   Bilirubin Urine SMALL (*)    All other components within normal limits  BASIC METABOLIC PANEL - Abnormal; Notable for the following components:   Calcium 8.8 (*)    Anion gap 4 (*)    All other components within normal limits  URINE CULTURE  CBC  PREGNANCY, URINE  GC/CHLAMYDIA PROBE AMP ([place]) NOT AT [facility]    EKG  EKG Interpretation None       Radiology Ct Abdomen Pelvis W Contrast  Result Date: 01/06/2018 CLINICAL DATA:  32 y/o F; bilateral flank and pelvic pain. Burning with urination. EXAM: CT  ABDOMEN AND PELVIS WITH CONTRAST TECHNIQUE: Multidetector CT imaging of the abdomen and pelvis was performed using the standard protocol following bolus administration of intravenous contrast. CONTRAST:  100 cc Isovue-300 COMPARISON:  02/17/2017 CT abdomen and pelvis. FINDINGS: Lower chest: No acute abnormality. Hepatobiliary: No focal liver abnormality is seen. No gallstones, gallbladder wall thickening, or biliary dilatation. Pancreas: Unremarkable. No pancreatic ductal dilatation or surrounding inflammatory changes. Spleen: Normal in size without focal abnormality. Adrenals/Urinary Tract: Adrenal glands are unremarkable. Kidneys are normal, without renal calculi, focal lesion, or hydronephrosis. Bladder is unremarkable. Stomach/Bowel: Stomach is within normal limits. Appendix appears normal. No evidence of bowel wall thickening, distention, or inflammatory changes. Vascular/Lymphatic: No significant vascular findings are present. No enlarged abdominal or pelvic lymph nodes. Reproductive: Hysterectomy. Left adnexal round simple cyst measuring 3.1 cm. Other: No abdominal wall hernia or abnormality. No abdominopelvic ascites. Musculoskeletal: No acute or significant osseous findings. IMPRESSION: No acute process identified.  Unremarkable CT of abdomen and pelvis. Electronically Signed   By: Mitzi Hansen M.D.   On: 01/06/2018 01:25    Procedures Procedures (including critical care time)  Medications Ordered in ED Medications  ondansetron (ZOFRAN) injection 4 mg (not administered)  morphine 4 MG/ML injection 4 mg (not administered)  sodium chloride 0.9 % bolus 1,000 mL (not administered)     Initial Impression / Assessment and Plan / ED Course  I have reviewed the triage vital signs and the nursing notes.  Pertinent labs & imaging results that were available during my care of the patient were reviewed by me and considered in my medical decision making (see chart for details).    Ongoing  flank and pelvic pain for the past 1 week with nausea vomiting and dysuria.  Recently treated for possible UTI and bacterial vaginosis though the urine culture is negative.  Abdomen and pelvic exam benign.  CT scan is negative.  Patient will be treated empirically for PID.  Discussed that she should finish her course of antibiotics as well as Flagyl for bacterial vaginosis.  Will treat PID with Rocephin and doxycycline.  Urine culture pending.  She is tolerating p.o. Safe sex practices discussed.  Patient is nontoxic-appearing tolerating p.o.  Return precautions discussed.  Final Clinical Impressions(s) /  ED Diagnoses   Final diagnoses:  Pelvic pain  PID (acute pelvic inflammatory disease)    ED Discharge Orders    None       Bella Brummet, Jeannett Senior, MD 01/06/18 [phone]

## 2018-01-07 LAB — URINE CULTURE: Culture: NO GROWTH

## 2018-01-08 ENCOUNTER — Ambulatory Visit: Payer: Medicare Other | Admitting: Neurology

## 2018-01-08 ENCOUNTER — Telehealth: Payer: Self-pay | Admitting: *Deleted

## 2018-01-08 NOTE — Telephone Encounter (Signed)
No showed follow up appointment. 

## 2018-01-09 ENCOUNTER — Encounter: Payer: Self-pay | Admitting: Neurology

## 2018-01-19 ENCOUNTER — Other Ambulatory Visit: Payer: Self-pay

## 2018-01-19 ENCOUNTER — Emergency Department (HOSPITAL_COMMUNITY)
Admission: EM | Admit: 2018-01-19 | Discharge: 2018-01-20 | Disposition: A | Discharge status: Home / Self Care | Payer: Medicare Other | Attending: Emergency Medicine | Admitting: Emergency Medicine

## 2018-01-19 ENCOUNTER — Emergency Department (HOSPITAL_COMMUNITY): Payer: Medicare Other

## 2018-01-19 ENCOUNTER — Encounter (HOSPITAL_COMMUNITY): Payer: Self-pay | Admitting: Emergency Medicine

## 2018-01-19 DIAGNOSIS — J069 Acute upper respiratory infection, unspecified: Secondary | ICD-10-CM

## 2018-01-19 DIAGNOSIS — J45909 Unspecified asthma, uncomplicated: Secondary | ICD-10-CM | POA: Insufficient documentation

## 2018-01-19 DIAGNOSIS — J111 Influenza due to unidentified influenza virus with other respiratory manifestations: Secondary | ICD-10-CM | POA: Diagnosis not present

## 2018-01-19 DIAGNOSIS — R05 Cough: Secondary | ICD-10-CM | POA: Diagnosis present

## 2018-01-19 MED ORDER — IBUPROFEN 200 MG PO TABS
400.0000 mg | ORAL_TABLET | Freq: Once | ORAL | Status: AC
  Administered 2018-01-20: 400 mg via ORAL
  Filled 2018-01-19: qty 2

## 2018-01-19 MED ORDER — ACETAMINOPHEN 325 MG PO TABS
650.0000 mg | ORAL_TABLET | Freq: Once | ORAL | Status: AC | PRN
  Administered 2018-01-20: 650 mg via ORAL
  Filled 2018-01-19: qty 2

## 2018-01-19 NOTE — ED Triage Notes (Signed)
Pt reports productive cough and fever since 01/17/18

## 2018-01-19 NOTE — ED Provider Notes (Signed)
[facility]-EMERGENCY DEPT Provider Note   CSN: [id number] Arrival date & time: 01/19/18  2119     History   Chief Complaint Chief Complaint  Patient presents with  . Cough  . Fever    HPI Laurie Fields is a 32 y.o. female.  Patient presents to the emergency department for evaluation of fever and cough.  Patient reports that symptoms began 2 days ago.  Cough has been productive of thick sputum.  She has been having chills and feels tired.  She has not had nausea, vomiting, diarrhea.      Past Medical History:  Diagnosis Date  . Asthma   . Blurred vision   . Depression   . Migraine   . Obesity   . Ovarian cyst   . Pseudotumor cerebri   . Vertigo     Patient Active Problem List   Diagnosis Date Noted  . Chronic migraine 10/08/2017  . Pseudotumor cerebri 10/08/2017    Past Surgical History:  Procedure Laterality Date  . ABDOMINAL HYSTERECTOMY    . carpel  tunnel release    . CESAREAN SECTION    . peritoneal cyst      OB History    No data available       Home Medications    Prior to Admission medications   Medication Sig Start Date End Date Taking? Authorizing Provider  albuterol (PROVENTIL HFA;VENTOLIN HFA) 108 (90 Base) MCG/ACT inhaler Inhale 2 puffs into the lungs every 6 (six) hours as needed for wheezing or shortness of breath.   Yes [provider]  fluticasone (FLONASE) 50 MCG/ACT nasal spray Place 1 spray daily into both nostrils. Patient taking differently: Place 1 spray into both nostrils daily as needed for allergies.  10/14/17  Yes Caccavale, Sophia, PA-C  meclizine (ANTIVERT) 25 MG tablet Take 1 tablet (25 mg total) by mouth 3 (three) times daily as needed for dizziness. 09/26/17  Yes Azalia Bilis, MD  ondansetron (ZOFRAN ODT) 4 MG disintegrating tablet Take 1 tablet (4 mg total) by mouth every 8 (eight) hours as needed for nausea or vomiting. 01/06/18  Yes Rancour, Jeannett Senior, MD  rizatriptan (MAXALT-MLT) 5  MG disintegrating tablet Take 1 tablet (5 mg total) as needed by mouth. May repeat in 2 hours if needed Patient taking differently: Take 5 mg as needed by mouth for migraine. May repeat in 2 hours if needed 10/08/17  Yes Levert Feinstein, MD  topiramate (TOPAMAX) 100 MG tablet Take 1 tablet (100 mg total) 2 (two) times daily by mouth. Titrating up to 100mg , BID. Patient taking differently: Take 100 mg by mouth 2 (two) times daily.  10/08/17  Yes Levert Feinstein, MD  benzonatate (TESSALON) 100 MG capsule Take 1 capsule (100 mg total) by mouth every 8 (eight) hours. 01/20/18   Gilda Crease, MD  ibuprofen (ADVIL,MOTRIN) 800 MG tablet Take 1 tablet (800 mg total) by mouth every 8 (eight) hours as needed for fever, headache or moderate pain. 01/20/18   Gilda Crease, MD  predniSONE (DELTASONE) 20 MG tablet Take 2 tablets (40 mg total) by mouth daily with breakfast. 01/20/18   Pollina, Canary Brim, MD    Family History Family History  Problem Relation Age of Onset  . Hypertension Other   . Diabetes Other   . Cancer Other   . CAD Other   . Leukemia Other   . Graves' disease Mother   . Other Mother        low blood sugar  .  Hypertension Father   . Diabetes Maternal Grandmother   . Colon polyps Maternal Grandmother   . Glaucoma Maternal Grandfather   . Cataracts Maternal Grandfather   . Prostate cancer Maternal Grandfather   . Diabetes Paternal Grandmother   . Cataracts Paternal Grandfather   . Prostate cancer Paternal Grandfather     Social History Social History   Tobacco Use  . Smoking status: Never Smoker  . Smokeless tobacco: Never Used  Substance Use Topics  . Alcohol use: No  . Drug use: No     Allergies   Serevent [salmeterol]   Review of Systems Review of Systems  Constitutional: Positive for chills and fever.  Respiratory: Positive for cough.   All other systems reviewed and are negative.    Physical Exam Updated Vital Signs BP 117/75 (BP Location:  Left Arm)   Pulse 96   Temp (!) 102.5 F (39.2 C) (Oral)   Resp 20   Ht 5\' 5"  (1.651 m)   Wt 130.6 kg (288 lb)   SpO2 98%   BMI 47.93 kg/m   Physical Exam  Constitutional: She is oriented to person, place, and time. She appears well-developed and well-nourished. No distress.  HENT:  Head: Normocephalic and atraumatic.  Right Ear: Hearing normal.  Left Ear: Hearing normal.  Nose: Nose normal.  Mouth/Throat: Oropharynx is clear and moist and mucous membranes are normal.  Eyes: Conjunctivae and EOM are normal. Pupils are equal, round, and reactive to light.  Neck: Normal range of motion. Neck supple.  Cardiovascular: Regular rhythm, S1 normal and S2 normal. Exam reveals no gallop and no friction rub.  No murmur heard. Pulmonary/Chest: Effort normal and breath sounds normal. No respiratory distress. She exhibits no tenderness.  Abdominal: Soft. Normal appearance and bowel sounds are normal. There is no hepatosplenomegaly. There is no tenderness. There is no rebound, no guarding, no tenderness at McBurney's point and negative Murphy's sign. No hernia.  Musculoskeletal: Normal range of motion.  Neurological: She is alert and oriented to person, place, and time. She has normal strength. No cranial nerve deficit or sensory deficit. Coordination normal. GCS eye subscore is 4. GCS verbal subscore is 5. GCS motor subscore is 6.  Skin: Skin is warm, dry and intact. No rash noted. No cyanosis.  Psychiatric: She has a normal mood and affect. Her speech is normal and behavior is normal. Thought content normal.  Nursing note and vitals reviewed.    ED Treatments / Results  Labs (all labs ordered are listed, but only abnormal results are displayed) Labs Reviewed - No data to display  EKG  EKG Interpretation None       Radiology Dg Chest 2 View  Result Date: 01/20/2018 CLINICAL DATA:  Fever, nausea, vomiting EXAM: CHEST  2 VIEW COMPARISON:  10/14/2017 FINDINGS: Heart and mediastinal  contours are within normal limits. No focal opacities or effusions. No acute bony abnormality. IMPRESSION: No active cardiopulmonary disease. Electronically Signed   By: Charlett Nose M.D.   On: 01/20/2018 00:04    Procedures Procedures (including critical care time)  Medications Ordered in ED Medications  acetaminophen (TYLENOL) tablet 650 mg (650 mg Oral Given 01/20/18 0000)  ibuprofen (ADVIL,MOTRIN) tablet 400 mg (400 mg Oral Given 01/20/18 0009)  oseltamivir (TAMIFLU) capsule 75 mg (75 mg Oral Given 01/20/18 0142)  predniSONE (DELTASONE) tablet 60 mg (60 mg Oral Given 01/20/18 0141)     Initial Impression / Assessment and Plan / ED Course  I have reviewed the triage vital signs  and the nursing notes.  Pertinent labs & imaging results that were available during my care of the patient were reviewed by me and considered in my medical decision making (see chart for details).     Patient presents to the emergency department for evaluation of fever and cough that has been ongoing for 2 days.  Patient had a temperature of 102.5 upon arrival here in the ER.  She is complaining of myalgias, fatigue.  She does not have any signs of meningitis.  She is oxygenating well, 100% on room air.  She is mildly tachycardic, likely secondary to her fever.  She does not appear septic.  Blood pressures are normal.  Chest x-ray does not show evidence of pneumonia.  This is likely the prevalent influenza in the community currently.  We will treat empirically.  Final Clinical Impressions(s) / ED Diagnoses   Final diagnoses:  Upper respiratory tract infection, unspecified type  Influenza    ED Discharge Orders        Ordered    predniSONE (DELTASONE) 20 MG tablet  Daily with breakfast     01/20/18 0057    benzonatate (TESSALON) 100 MG capsule  Every 8 hours     01/20/18 0057    ibuprofen (ADVIL,MOTRIN) 800 MG tablet  Every 8 hours PRN     01/20/18 0057       Gilda CreasePollina, Christopher J, MD 01/20/18  [phone]

## 2018-01-20 MED ORDER — PREDNISONE 20 MG PO TABS
60.0000 mg | ORAL_TABLET | Freq: Once | ORAL | Status: AC
Start: 2018-01-20 — End: 2018-01-20
  Administered 2018-01-20: 60 mg via ORAL
  Filled 2018-01-20: qty 3

## 2018-01-20 MED ORDER — ALBUTEROL SULFATE HFA 108 (90 BASE) MCG/ACT IN AERS
2.0000 | INHALATION_SPRAY | RESPIRATORY_TRACT | Status: DC | PRN
  Administered 2018-01-20: 2 via RESPIRATORY_TRACT
  Filled 2018-01-20: qty 6.7

## 2018-01-20 MED ORDER — BENZONATATE 100 MG PO CAPS
100.0000 mg | ORAL_CAPSULE | Freq: Three times a day (TID) | ORAL | 0 refills | Status: DC
Start: 2018-01-20 — End: 2018-03-25

## 2018-01-20 MED ORDER — PREDNISONE 20 MG PO TABS: 40.0000 mg | ORAL_TABLET | Freq: Every day | ORAL | 0 refills | Status: DC

## 2018-01-20 MED ORDER — OSELTAMIVIR PHOSPHATE 75 MG PO CAPS
75.0000 mg | ORAL_CAPSULE | Freq: Once | ORAL | Status: AC
  Administered 2018-01-20: 75 mg via ORAL
  Filled 2018-01-20: qty 1

## 2018-01-20 MED ORDER — IBUPROFEN 800 MG PO TABS: 800.0000 mg | ORAL_TABLET | Freq: Three times a day (TID) | ORAL | 0 refills | Status: DC | PRN

## 2018-02-02 ENCOUNTER — Ambulatory Visit (INDEPENDENT_AMBULATORY_CARE_PROVIDER_SITE_OTHER): Payer: Medicare Other

## 2018-02-02 ENCOUNTER — Ambulatory Visit (HOSPITAL_COMMUNITY)
Admission: EM | Admit: 2018-02-02 | Discharge: 2018-02-02 | Disposition: A | Discharge status: Home / Self Care | Payer: Medicare Other | Attending: Internal Medicine | Admitting: Internal Medicine

## 2018-02-02 ENCOUNTER — Other Ambulatory Visit: Payer: Self-pay

## 2018-02-02 ENCOUNTER — Encounter (HOSPITAL_COMMUNITY): Payer: Self-pay | Admitting: *Deleted

## 2018-02-02 DIAGNOSIS — R0981 Nasal congestion: Secondary | ICD-10-CM

## 2018-02-02 DIAGNOSIS — R071 Chest pain on breathing: Secondary | ICD-10-CM

## 2018-02-02 DIAGNOSIS — Z79899 Other long term (current) drug therapy: Secondary | ICD-10-CM | POA: Insufficient documentation

## 2018-02-02 DIAGNOSIS — J4531 Mild persistent asthma with (acute) exacerbation: Secondary | ICD-10-CM | POA: Insufficient documentation

## 2018-02-02 DIAGNOSIS — J45909 Unspecified asthma, uncomplicated: Secondary | ICD-10-CM | POA: Diagnosis present

## 2018-02-02 DIAGNOSIS — R062 Wheezing: Secondary | ICD-10-CM | POA: Diagnosis not present

## 2018-02-02 DIAGNOSIS — R05 Cough: Secondary | ICD-10-CM

## 2018-02-02 LAB — POCT RAPID STREP A: Streptococcus, Group A Screen (Direct): NEGATIVE

## 2018-02-02 MED ORDER — IPRATROPIUM-ALBUTEROL 0.5-2.5 (3) MG/3ML IN SOLN
3.0000 mL | Freq: Once | RESPIRATORY_TRACT | Status: AC
  Administered 2018-02-02: 3 mL via RESPIRATORY_TRACT

## 2018-02-02 MED ORDER — ALBUTEROL SULFATE HFA 108 (90 BASE) MCG/ACT IN AERS: 2.0000 | INHALATION_SPRAY | Freq: Four times a day (QID) | RESPIRATORY_TRACT | 0 refills | Status: DC | PRN

## 2018-02-02 MED ORDER — MONTELUKAST SODIUM 10 MG PO TABS
10.0000 mg | ORAL_TABLET | Freq: Every day | ORAL | 0 refills | Status: DC
Start: 2018-02-02 — End: 2018-04-08

## 2018-02-02 MED ORDER — IPRATROPIUM-ALBUTEROL 0.5-2.5 (3) MG/3ML IN SOLN
RESPIRATORY_TRACT | Status: AC
  Filled 2018-02-02: qty 3

## 2018-02-02 MED ORDER — PREDNISONE 50 MG PO TABS: 50.0000 mg | ORAL_TABLET | Freq: Every day | ORAL | 0 refills | Status: AC

## 2018-02-02 MED ORDER — ALBUTEROL SULFATE (2.5 MG/3ML) 0.083% IN NEBU: 2.5000 mg | INHALATION_SOLUTION | Freq: Four times a day (QID) | RESPIRATORY_TRACT | 0 refills | Status: DC | PRN

## 2018-02-02 MED ORDER — METHYLPREDNISOLONE SODIUM SUCC 125 MG IJ SOLR
125.0000 mg | Freq: Once | INTRAMUSCULAR | Status: AC
  Administered 2018-02-02: 125 mg via INTRAMUSCULAR

## 2018-02-02 MED ORDER — METHYLPREDNISOLONE SODIUM SUCC 125 MG IJ SOLR
INTRAMUSCULAR | Status: AC
  Filled 2018-02-02: qty 2

## 2018-02-02 MED ORDER — AZITHROMYCIN 250 MG PO TABS: 250.0000 mg | ORAL_TABLET | Freq: Every day | ORAL | 0 refills | Status: DC

## 2018-02-02 NOTE — ED Triage Notes (Addendum)
Per pt she has asthma and it's acting up, per pt her inhaler is not working, per pt she took her albuterol 30 mins ago.

## 2018-02-02 NOTE — ED Provider Notes (Signed)
[facility]    CSN: [id number] Arrival date & time: 02/02/18  1348     History   Chief Complaint Chief Complaint  Patient presents with  . Asthma  . Cough    HPI Laurie Fields is a 32 y.o. female.   32 year old female with history of asthma that required intubation comes in for 1 day history of asthma exacerbation.  Patient states she has been dealing with cold symptoms for the past 2 weeks.  Started having wheezing yesterday.  Works at a [employer], which exacerbated the symptoms.  States she has been having rhinorrhea, nasal congestion, productive cough for the past 2 weeks.  T-max 103, fever last night, took Tylenol/Motrin as night.  Has been taking OTC cold medication to help with symptoms.  Has had increased use of albuterol with minimal relief.  Never smoker.      Past Medical History:  Diagnosis Date  . Asthma    Hx intubation R/T asthma  . Blurred vision   . Depression   . Migraine   . Obesity   . Ovarian cyst   . Pseudotumor cerebri   . Vertigo     Patient Active Problem List   Diagnosis Date Noted  . Chronic migraine 10/08/2017  . Pseudotumor cerebri 10/08/2017    Past Surgical History:  Procedure Laterality Date  . ABDOMINAL HYSTERECTOMY    . carpel  tunnel release    . CESAREAN SECTION    . peritoneal cyst      OB History    No data available       Home Medications    Prior to Admission medications   Medication Sig Start Date End Date Taking? Authorizing Provider  albuterol (PROVENTIL HFA;VENTOLIN HFA) 108 (90 Base) MCG/ACT inhaler Inhale 2 puffs into the lungs every 6 (six) hours as needed for wheezing or shortness of breath. 02/02/18   Cathie Hoops, Amy V, PA-C  albuterol (PROVENTIL) (2.5 MG/3ML) 0.083% nebulizer solution Take 3 mLs (2.5 mg total) by nebulization every 6 (six) hours as needed for wheezing or shortness of breath. 02/02/18   Cathie Hoops, Amy V, PA-C  azithromycin (ZITHROMAX) 250 MG tablet Take 1 tablet (250 mg total) by  mouth daily. Take first 2 tablets together, then 1 every day until finished. 02/02/18   Cathie Hoops, Amy V, PA-C  benzonatate (TESSALON) 100 MG capsule Take 1 capsule (100 mg total) by mouth every 8 (eight) hours. 01/20/18   Gilda Crease, MD  fluticasone (FLONASE) 50 MCG/ACT nasal spray Place 1 spray daily into both nostrils. Patient taking differently: Place 1 spray into both nostrils daily as needed for allergies.  10/14/17   Caccavale, Sophia, PA-C  ibuprofen (ADVIL,MOTRIN) 800 MG tablet Take 1 tablet (800 mg total) by mouth every 8 (eight) hours as needed for fever, headache or moderate pain. 01/20/18   Gilda Crease, MD  meclizine (ANTIVERT) 25 MG tablet Take 1 tablet (25 mg total) by mouth 3 (three) times daily as needed for dizziness. 09/26/17   Azalia Bilis, MD  montelukast (SINGULAIR) 10 MG tablet Take 1 tablet (10 mg total) by mouth at bedtime. 02/02/18   Cathie Hoops, Amy V, PA-C  ondansetron (ZOFRAN ODT) 4 MG disintegrating tablet Take 1 tablet (4 mg total) by mouth every 8 (eight) hours as needed for nausea or vomiting. 01/06/18   Rancour, Jeannett Senior, MD  predniSONE (DELTASONE) 50 MG tablet Take 1 tablet (50 mg total) by mouth daily for 5 days. 02/02/18 02/07/18  Belinda Fisher, PA-C  rizatriptan (MAXALT-MLT) 5 MG disintegrating tablet Take 1 tablet (5 mg total) as needed by mouth. May repeat in 2 hours if needed Patient taking differently: Take 5 mg as needed by mouth for migraine. May repeat in 2 hours if needed 10/08/17   Levert Feinstein, MD  topiramate (TOPAMAX) 100 MG tablet Take 1 tablet (100 mg total) 2 (two) times daily by mouth. Titrating up to 100mg , BID. Patient taking differently: Take 100 mg by mouth 2 (two) times daily.  10/08/17   Levert Feinstein, MD    Family History Family History  Problem Relation Age of Onset  . Hypertension Other   . Diabetes Other   . Cancer Other   . CAD Other   . Leukemia Other   . Graves' disease Mother   . Other Mother        low blood sugar  . Hypertension  Father   . Diabetes Maternal Grandmother   . Colon polyps Maternal Grandmother   . Glaucoma Maternal Grandfather   . Cataracts Maternal Grandfather   . Prostate cancer Maternal Grandfather   . Diabetes Paternal Grandmother   . Cataracts Paternal Grandfather   . Prostate cancer Paternal Grandfather     Social History Social History   Tobacco Use  . Smoking status: Never Smoker  . Smokeless tobacco: Never Used  Substance Use Topics  . Alcohol use: No  . Drug use: No     Allergies   Serevent [salmeterol]   Review of Systems Review of Systems  Reason unable to perform ROS: See HPI as above.     Physical Exam Triage Vital Signs ED Triage Vitals  Enc Vitals Group     BP 02/02/18 1405 113/73     Pulse Rate 02/02/18 1405 87     Resp --      Temp 02/02/18 1405 (!) 97.4 F (36.3 C)     Temp Source 02/02/18 1405 Oral     SpO2 02/02/18 1405 100 %     Weight --      Height --      Head Circumference --      Peak Flow --      Pain Score 02/02/18 1403 5     Pain Loc --      Pain Edu? --      Excl. in GC? --    No data found.  Updated Vital Signs BP 113/73 (BP Location: Left Arm)   Pulse 87   Temp (!) 97.4 F (36.3 C) (Oral)   SpO2 100%   Physical Exam  Constitutional: She is oriented to person, place, and time. She appears well-developed and well-nourished. No distress.  HENT:  Head: Normocephalic and atraumatic.  Right Ear: Tympanic membrane, external ear and ear canal normal. Tympanic membrane is not erythematous and not bulging.  Left Ear: Tympanic membrane, external ear and ear canal normal. Tympanic membrane is not erythematous and not bulging.  Nose: Rhinorrhea present. Right sinus exhibits no maxillary sinus tenderness and no frontal sinus tenderness. Left sinus exhibits no maxillary sinus tenderness and no frontal sinus tenderness.  Mouth/Throat: Uvula is midline and mucous membranes are normal. Posterior oropharyngeal erythema present. Tonsils are 2+ on  the right. Tonsils are 1+ on the left. No tonsillar exudate.  Eyes: Conjunctivae are normal. Pupils are equal, round, and reactive to light.  Neck: Normal range of motion. Neck supple.  Cardiovascular: Normal rate, regular rhythm and normal heart sounds. Exam reveals no gallop and no friction rub.  No  murmur heard. Pulmonary/Chest: Effort normal.  Better air movement after DuoNeb x2.  No longer with labored breathing.  No accessory muscle use, respiratory distress.  Coarse breath sounds throughout, improved mildly with cough.  Lymphadenopathy:    She has no cervical adenopathy.  Neurological: She is alert and oriented to person, place, and time.  Skin: Skin is warm and dry.  Psychiatric: She has a normal mood and affect. Her behavior is normal. Judgment normal.     UC Treatments / Results  Labs (all labs ordered are listed, but only abnormal results are displayed) Labs Reviewed  CULTURE, GROUP A STREP [facility]  POCT RAPID STREP A    EKG  EKG Interpretation None       Radiology Dg Chest 2 View  Result Date: 02/02/2018 CLINICAL DATA:  Productive cough with dark yellow and green sputum, tightness in central chest, SoB, fever of 102 currently, nasal drainage, chills, HA, body aches x 2 weeks. Pt sts that cough is worse when laying down on either side, blurred vision when SoB with numbness of lips. Hx of asthma pt sts asthma is acting up, and inhaler is not working. No known heart conditions, nonsmoker, nondiabetic. EXAM: CHEST - 2 VIEW COMPARISON:  01/19/2018 FINDINGS: The heart size and mediastinal contours are within normal limits. Both lungs are clear. No pleural effusion or pneumothorax. The visualized skeletal structures are unremarkable. IMPRESSION: Normal chest radiographs. Electronically Signed   By: Amie Portlandavid  Ormond M.D.   On: 02/02/2018 15:14    Procedures Procedures (including critical care time)  Medications Ordered in UC Medications  ipratropium-albuterol (DUONEB)  0.5-2.5 (3) MG/3ML nebulizer solution 3 mL (3 mLs Nebulization Given 02/02/18 1416)  methylPREDNISolone sodium succinate (SOLU-MEDROL) 125 mg/2 mL injection 125 mg (125 mg Intramuscular Given 02/02/18 1436)  ipratropium-albuterol (DUONEB) 0.5-2.5 (3) MG/3ML nebulizer solution 3 mL (3 mLs Nebulization Given 02/02/18 1436)     Initial Impression / Assessment and Plan / UC Course  I have reviewed the triage vital signs and the nursing notes.  Pertinent labs & imaging results that were available during my care of the patient were reviewed by me and considered in my medical decision making (see chart for details).  Clinical Course as of Feb 03 1648  Sun Feb 02, 2018  1434 Patient with audible wheezing and tightness during triage, DuoNeb was started.  Patient with improved symptoms after first DuoNeb, however still speaking in short sentences  with labored breathing.  Lungs clear to auscultation, decreased air movement.  We will start second DuoNeb prior to full HPI.  [AY]    Clinical Course User Index [AY] Linward HeadlandYu, Amy V, PA-C   Chest x-ray negative for pneumonia.  Rapid strep negative.  Patient with better air movement, able to speak in full sentences after DuoNeb x2.  Will have patient start azithromycin for bronchitis.  Prednisone for asthma exacerbation.  Restart singular, as patient has had better control of her asthma in the past on medication.  Albuterol inhaler and nebulizer refilled.  Patient to follow-up with PCP for further evaluation and management of asthma.  Return precautions given.  Patient expresses understanding and agrees to plan.  Final Clinical Impressions(s) / UC Diagnoses   Final diagnoses:  Mild persistent asthma with exacerbation    ED Discharge Orders        Ordered    predniSONE (DELTASONE) 50 MG tablet  Daily     02/02/18 1529    albuterol (PROVENTIL HFA;VENTOLIN HFA) 108 (90 Base) MCG/ACT inhaler  Every  6 hours PRN     02/02/18 1529    albuterol (PROVENTIL) (2.5  MG/3ML) 0.083% nebulizer solution  Every 6 hours PRN     02/02/18 1529    montelukast (SINGULAIR) 10 MG tablet  Daily at bedtime     02/02/18 1529    azithromycin (ZITHROMAX) 250 MG tablet  Daily     02/02/18 1529        Belinda Fisher, PA-C 02/02/18 1649

## 2018-02-02 NOTE — ED Triage Notes (Signed)
Reports having broken nebulizer at home.

## 2018-02-02 NOTE — Discharge Instructions (Signed)
Chest x-ray negative for pneumonia.  Rapid strep negative.  Start azithromycin for bronchitis.  Prednisone as directed.  I have refilled her Singulair to help with your asthma.  Albuterol nebulizer/inhaler as needed for shortness of breath and wheezing. Keep hydrated, your urine should be clear to pale yellow in color. Tylenol/motrin for fever and pain. Monitor for any worsening of symptoms, chest pain, shortness of breath, wheezing, swelling of the throat, go to the emergency department for further evaluation needed.

## 2018-02-05 LAB — CULTURE, GROUP A STREP (THRC)

## 2018-03-25 ENCOUNTER — Encounter (HOSPITAL_COMMUNITY): Payer: Self-pay | Admitting: Family Medicine

## 2018-03-25 ENCOUNTER — Ambulatory Visit (INDEPENDENT_AMBULATORY_CARE_PROVIDER_SITE_OTHER): Payer: Worker's Compensation

## 2018-03-25 ENCOUNTER — Other Ambulatory Visit: Payer: Self-pay

## 2018-03-25 ENCOUNTER — Ambulatory Visit (HOSPITAL_COMMUNITY)
Admission: EM | Admit: 2018-03-25 | Discharge: 2018-03-25 | Disposition: A | Discharge status: Home / Self Care | Payer: Worker's Compensation | Attending: Family Medicine | Admitting: Family Medicine

## 2018-03-25 DIAGNOSIS — M79662 Pain in left lower leg: Secondary | ICD-10-CM | POA: Diagnosis not present

## 2018-03-25 DIAGNOSIS — S92405A Nondisplaced unspecified fracture of left great toe, initial encounter for closed fracture: Secondary | ICD-10-CM

## 2018-03-25 MED ORDER — RIVAROXABAN (XARELTO) VTE STARTER PACK (15 & 20 MG): 15.0000 mg | ORAL_TABLET | Freq: Two times a day (BID) | ORAL | 0 refills | Status: DC
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
## 2018-03-25 NOTE — ED Triage Notes (Signed)
Pt reports with known fracture in toe, pt has been placed in boot and is concerned why she hasn't heard from ortho.  Pt also complains that she had an episode of shortness of breath earlier in the day and is denying any symptoms at this time.

## 2018-03-25 NOTE — Discharge Instructions (Addendum)
The x-ray shows the bones in good alignment and healing as expected.  You need to keep wearing the boot until seen by orthopedics and cleared.  I am referring you to [facility].  Call for an appointment tomorrow.  Regarding the calf pain and chest pain, I want you to start Xarelto tonight to anticoagulate your blood until we can get the venous doppler, currently scheduled at [facility] for Thursday

## 2018-03-27 ENCOUNTER — Ambulatory Visit (HOSPITAL_COMMUNITY): Admit: 2018-03-27 | Payer: Medicare Other

## 2018-04-07 ENCOUNTER — Emergency Department (HOSPITAL_COMMUNITY)
Admission: EM | Admit: 2018-04-07 | Discharge: 2018-04-07 | Discharge status: Against Medical Advice | Payer: Medicare Other

## 2018-04-08 ENCOUNTER — Ambulatory Visit (HOSPITAL_COMMUNITY)
Admission: EM | Admit: 2018-04-08 | Discharge: 2018-04-08 | Disposition: A | Discharge status: Home / Self Care | Payer: Medicare Other | Attending: Family Medicine | Admitting: Family Medicine

## 2018-04-08 ENCOUNTER — Encounter (HOSPITAL_COMMUNITY): Payer: Self-pay | Admitting: Family Medicine

## 2018-04-08 DIAGNOSIS — J4521 Mild intermittent asthma with (acute) exacerbation: Secondary | ICD-10-CM | POA: Diagnosis not present

## 2018-04-08 MED ORDER — IPRATROPIUM-ALBUTEROL 0.5-2.5 (3) MG/3ML IN SOLN
RESPIRATORY_TRACT | Status: AC
Start: 2018-04-08 — End: 2018-04-08
  Filled 2018-04-08: qty 3

## 2018-04-08 MED ORDER — MONTELUKAST SODIUM 10 MG PO TABS: 10.0000 mg | ORAL_TABLET | Freq: Every day | ORAL | 0 refills | Status: DC

## 2018-04-08 MED ORDER — PREDNISONE 50 MG PO TABS: 50.0000 mg | ORAL_TABLET | Freq: Every day | ORAL | 0 refills | Status: AC

## 2018-04-08 MED ORDER — AZITHROMYCIN 250 MG PO TABS: ORAL_TABLET | ORAL | 0 refills | Status: AC

## 2018-04-08 MED ORDER — IPRATROPIUM-ALBUTEROL 0.5-2.5 (3) MG/3ML IN SOLN: 3.0000 mL | RESPIRATORY_TRACT | 0 refills | Status: DC | PRN

## 2018-04-08 MED ORDER — IPRATROPIUM-ALBUTEROL 0.5-2.5 (3) MG/3ML IN SOLN
RESPIRATORY_TRACT | Status: AC
  Filled 2018-04-08: qty 3

## 2018-04-08 MED ORDER — IPRATROPIUM-ALBUTEROL 0.5-2.5 (3) MG/3ML IN SOLN
3.0000 mL | Freq: Once | RESPIRATORY_TRACT | Status: AC
  Administered 2018-04-08: 3 mL via RESPIRATORY_TRACT

## 2018-04-08 NOTE — Discharge Instructions (Signed)
Push fluids to ensure adequate hydration and keep secretions thin.  Tylenol and/or ibuprofen as needed for pain or fevers.  Complete course of antibiotics.  5 days of prednisone. Use of inhalers and/or nebulizers as needed. If develop increased shortness of breath, chest pain, dizziness, lightheadedness or otherwise worsening please return or go to Er.

## 2018-04-08 NOTE — ED Provider Notes (Signed)
[facility]    CSN: [id number] Arrival date & time: 04/08/18  1856     History   Chief Complaint Chief Complaint  Patient presents with  . Shortness of Breath    HPI Laurie Fields is a 32 y.o. female.   Laurie Fields presents with complaints of cough, wheezing and shortness of breath  Which started 1 week ago. Has been using her inhaler frequently due to this. History of asthma, hx of intubation due to asthma in the past. Worse at night. Congestion. No fevers or known ill contacts. Out of nebulizers and singulair. Denies sore throat, ear pain. Hx asthma, depression, migraines, pseudotumor cerebri.   ROS per HPI.      Past Medical History:  Diagnosis Date  . Asthma    Hx intubation R/T asthma  . Blurred vision   . Depression   . Migraine   . Obesity   . Ovarian cyst   . Pseudotumor cerebri   . Vertigo     Patient Active Problem List   Diagnosis Date Noted  . Chronic migraine 10/08/2017  . Pseudotumor cerebri 10/08/2017    Past Surgical History:  Procedure Laterality Date  . ABDOMINAL HYSTERECTOMY    . carpel  tunnel release    . CESAREAN SECTION    . peritoneal cyst      OB History   None      Home Medications    Prior to Admission medications   Medication Sig Start Date End Date Taking? Authorizing Provider  albuterol (PROVENTIL HFA;VENTOLIN HFA) 108 (90 Base) MCG/ACT inhaler Inhale 2 puffs into the lungs every 6 (six) hours as needed for wheezing or shortness of breath. 02/02/18   Cathie Hoops, Amy V, PA-C  albuterol (PROVENTIL) (2.5 MG/3ML) 0.083% nebulizer solution Take 3 mLs (2.5 mg total) by nebulization every 6 (six) hours as needed for wheezing or shortness of breath. 02/02/18   Cathie Hoops, Amy V, PA-C  azithromycin (ZITHROMAX) 250 MG tablet Take 2 tablets (500 mg total) by mouth daily for 1 day, THEN 1 tablet (250 mg total) daily for 4 days. 04/08/18 04/13/18  Georgetta Haber, NP  fluticasone (FLONASE) 50 MCG/ACT nasal spray Place 1 spray daily  into both nostrils. Patient taking differently: Place 1 spray into both nostrils daily as needed for allergies.  10/14/17   Caccavale, Sophia, PA-C  ibuprofen (ADVIL,MOTRIN) 800 MG tablet Take 1 tablet (800 mg total) by mouth every 8 (eight) hours as needed for fever, headache or moderate pain. 01/20/18   Gilda Crease, MD  ipratropium-albuterol (DUONEB) 0.5-2.5 (3) MG/3ML SOLN Take 3 mLs by nebulization every 4 (four) hours as needed. 04/08/18   Georgetta Haber, NP  meclizine (ANTIVERT) 25 MG tablet Take 1 tablet (25 mg total) by mouth 3 (three) times daily as needed for dizziness. 09/26/17   Azalia Bilis, MD  montelukast (SINGULAIR) 10 MG tablet Take 1 tablet (10 mg total) by mouth at bedtime. 04/08/18   Georgetta Haber, NP  predniSONE (DELTASONE) 50 MG tablet Take 1 tablet (50 mg total) by mouth daily with breakfast for 5 days. 04/08/18 04/13/18  Georgetta Haber, NP  Rivaroxaban 15 & 20 MG TBPK Use as directed 15 mg in the mouth or throat 2 (two) times daily. Take as directed on package: Start with one  tablet by mouth twice a day with food. On Day 22, switch to one  tablet once a day with food. 03/25/18   Elvina Sidle, MD  rizatriptan (MAXALT-MLT) 5 MG disintegrating  tablet Take 1 tablet (5 mg total) as needed by mouth. May repeat in 2 hours if needed Patient taking differently: Take 5 mg as needed by mouth for migraine. May repeat in 2 hours if needed 10/08/17   Levert Feinstein, MD  topiramate (TOPAMAX) 100 MG tablet Take 1 tablet (100 mg total) 2 (two) times daily by mouth. Titrating up to , BID. Patient taking differently: Take 100 mg by mouth 2 (two) times daily.  10/08/17   Levert Feinstein, MD    Family History Family History  Problem Relation Age of Onset  . Hypertension Other   . Diabetes Other   . Cancer Other   . CAD Other   . Leukemia Other   . Graves' disease Mother   . Other Mother        low blood sugar  . Hypertension Father   . Diabetes Maternal Grandmother     . Colon polyps Maternal Grandmother   . Glaucoma Maternal Grandfather   . Cataracts Maternal Grandfather   . Prostate cancer Maternal Grandfather   . Diabetes Paternal Grandmother   . Cataracts Paternal Grandfather   . Prostate cancer Paternal Grandfather     Social History Social History   Tobacco Use  . Smoking status: Never Smoker  . Smokeless tobacco: Never Used  Substance Use Topics  . Alcohol use: No  . Drug use: No     Allergies   Serevent [salmeterol]   Review of Systems Review of Systems   Physical Exam Triage Vital Signs ED Triage Vitals  Enc Vitals Group     BP 04/08/18 1938 129/76     Pulse Rate 04/08/18 1938 73     Resp 04/08/18 1938 20     Temp 04/08/18 1938 98.7 F (37.1 C)     Temp Source 04/08/18 1938 Oral     SpO2 04/08/18 1938 100 %     Weight --      Height --      Head Circumference --      Peak Flow --      Pain Score 04/08/18 1936 0     Pain Loc --      Pain Edu? --      Excl. in GC? --    No data found.  Updated Vital Signs BP 129/76   Pulse 73   Temp 98.7 F (37.1 C) (Oral)   Resp 20   SpO2 100%   Visual Acuity Right Eye Distance:   Left Eye Distance:   Bilateral Distance:    Right Eye Near:   Left Eye Near:    Bilateral Near:     Physical Exam  Constitutional: She is oriented to person, place, and time. She appears well-developed and well-nourished. No distress.  HENT:  Head: Normocephalic and atraumatic.  Right Ear: Tympanic membrane, external ear and ear canal normal.  Left Ear: Tympanic membrane, external ear and ear canal normal.  Nose: Nose normal.  Mouth/Throat: Uvula is midline, oropharynx is clear and moist and mucous membranes are normal. No tonsillar exudate.  Eyes: Pupils are equal, round, and reactive to light. Conjunctivae and EOM are normal.  Cardiovascular: Normal rate, regular rhythm and normal heart sounds.  Pulmonary/Chest: Effort normal. She has wheezes.  Faint expiratory wheezes noted;  frequent strong cough noted   Neurological: She is alert and oriented to person, place, and time.  Skin: Skin is warm and dry.     UC Treatments / Results  Labs (all labs ordered are  listed, but only abnormal results are displayed) Labs Reviewed - No data to display  EKG None  Radiology No results found.  Procedures Procedures (including critical care time)  Medications Ordered in UC Medications  ipratropium-albuterol (DUONEB) 0.5-2.5 (3) MG/3ML nebulizer solution 3 mL (has no administration in time range)    Initial Impression / Assessment and Plan / UC Course  I have reviewed the triage vital signs and the nursing notes.  Pertinent labs & imaging results that were available during my care of the patient were reviewed by me and considered in my medical decision making (see chart for details).     Afebrile. Without hypoxia, tachypnea, tachycardia or increased work of breathing. Frequent cough and wheezing noted. duoneb provided in clinic. Lung sounds improved and patient verbalizes feeling improved. Course of prednisone and zpack provided. Return precautions provided. Patient verbalized understanding and agreeable to plan.  Ambulatory out of clinic without difficulty.    Final Clinical Impressions(s) / UC Diagnoses   Final diagnoses:  Mild intermittent asthma with acute exacerbation   Discharge Instructions   None    ED Prescriptions    Medication Sig Dispense Auth. Provider   montelukast (SINGULAIR) 10 MG tablet Take 1 tablet (10 mg total) by mouth at bedtime. 30 tablet Michio Thier, Dorene Grebe B, NP   ipratropium-albuterol (DUONEB) 0.5-2.5 (3) MG/3ML SOLN Take 3 mLs by nebulization every 4 (four) hours as needed. 360 mL Linus Mako B, NP   predniSONE (DELTASONE) 50 MG tablet Take 1 tablet (50 mg total) by mouth daily with breakfast for 5 days. 5 tablet Linus Mako B, NP   azithromycin (ZITHROMAX) 250 MG tablet Take 2 tablets (500 mg total) by mouth daily for 1 day, THEN  1 tablet (250 mg total) daily for 4 days. 6 tablet Georgetta Haber, NP     Controlled Substance Prescriptions [place] Controlled Substance Registry consulted? Not Applicable   Georgetta Haber, NP 04/08/18 2046

## 2018-04-08 NOTE — ED Triage Notes (Addendum)
Pt with hx of asthma here for SOB, coughing, wheezing and mucous. sts worse at night. sts that she has been using her inhaler more than usual. She reports needing a prescription for nebulizer machine.

## 2018-05-11 ENCOUNTER — Encounter (HOSPITAL_COMMUNITY): Payer: Self-pay | Admitting: Emergency Medicine

## 2018-05-11 ENCOUNTER — Other Ambulatory Visit: Payer: Self-pay

## 2018-05-11 ENCOUNTER — Emergency Department (HOSPITAL_COMMUNITY)
Admission: EM | Admit: 2018-05-11 | Discharge: 2018-05-11 | Disposition: A | Discharge status: Home / Self Care | Payer: Medicare HMO | Attending: Emergency Medicine | Admitting: Emergency Medicine

## 2018-05-11 DIAGNOSIS — R0789 Other chest pain: Secondary | ICD-10-CM | POA: Insufficient documentation

## 2018-05-11 DIAGNOSIS — J45909 Unspecified asthma, uncomplicated: Secondary | ICD-10-CM | POA: Diagnosis not present

## 2018-05-11 DIAGNOSIS — Z79899 Other long term (current) drug therapy: Secondary | ICD-10-CM | POA: Diagnosis not present

## 2018-05-11 DIAGNOSIS — R0781 Pleurodynia: Secondary | ICD-10-CM

## 2018-05-11 LAB — URINALYSIS, ROUTINE W REFLEX MICROSCOPIC
BILIRUBIN URINE: NEGATIVE
GLUCOSE, UA: NEGATIVE mg/dL
Hgb urine dipstick: NEGATIVE
KETONES UR: NEGATIVE mg/dL
Leukocytes, UA: NEGATIVE
Nitrite: NEGATIVE
PH: 5 (ref 5.0–8.0)
PROTEIN: NEGATIVE mg/dL
Specific Gravity, Urine: 1.031 — ABNORMAL HIGH (ref 1.005–1.030)

## 2018-05-11 MED ORDER — OXYCODONE-ACETAMINOPHEN 5-325 MG PO TABS: 1.0000 | ORAL_TABLET | Freq: Four times a day (QID) | ORAL | 0 refills | Status: DC | PRN

## 2018-05-11 MED ORDER — NAPROXEN 375 MG PO TABS: ORAL_TABLET | ORAL | 0 refills | Status: DC

## 2018-05-11 NOTE — ED Provider Notes (Signed)
[facility] Provider Note: Laurie DellJ. Lane Mahlet Jergens, Laurie Fields, Laurie Fields  CSN: [id number] MRN: [id number] ARRIVAL: 05/11/18 at 0106 ROOM: [facility]14/[facility]14   CHIEF COMPLAINT  Flank Pain   HISTORY OF PRESENT ILLNESS  05/11/18 5:21 AM Laurie MuVictoria Fields is a 32 y.o. female with a 2-day history of pain in her left flank.  By left flank she is referring to her left lower ribs as well as her left kidney area.  Pain is worse with deep breathing or movement.  There is no associated rash.  She denies shortness of breath.  She denies trauma.  She denies abdominal pain.  She denies urinary changes.  Pain is moderate and dull.  She also complains of being sleepy with loss of appetite.  She has been taking Tylenol without adequate relief.  She is concerned the pain may be related to known ovarian cysts.   Past Medical History:  Diagnosis Date  . Asthma    Hx intubation R/T asthma  . Blurred vision   . Depression   . Migraine   . Obesity   . Ovarian cyst   . Pseudotumor cerebri   . Vertigo     Past Surgical History:  Procedure Laterality Date  . ABDOMINAL HYSTERECTOMY    . carpel  tunnel release    . CESAREAN SECTION    . peritoneal cyst      Family History  Problem Relation Age of Onset  . Hypertension Other   . Diabetes Other   . Cancer Other   . CAD Other   . Leukemia Other   . Graves' disease Mother   . Other Mother        low blood sugar  . Hypertension Father   . Diabetes Maternal Grandmother   . Colon polyps Maternal Grandmother   . Glaucoma Maternal Grandfather   . Cataracts Maternal Grandfather   . Prostate cancer Maternal Grandfather   . Diabetes Paternal Grandmother   . Cataracts Paternal Grandfather   . Prostate cancer Paternal Grandfather     Social History   Tobacco Use  . Smoking status: Never Smoker  . Smokeless tobacco: Never Used  Substance Use Topics  . Alcohol use: No  . Drug use: No    Prior to Admission medications   Medication Sig Start Date End Date  Taking? Authorizing Provider  albuterol (PROVENTIL HFA;VENTOLIN HFA) 108 (90 Base) MCG/ACT inhaler Inhale 2 puffs into the lungs every 6 (six) hours as needed for wheezing or shortness of breath. 02/02/18   Cathie HoopsYu, Amy V, PA-C  albuterol (PROVENTIL) (2.5 MG/3ML) 0.083% nebulizer solution Take 3 mLs (2.5 mg total) by nebulization every 6 (six) hours as needed for wheezing or shortness of breath. 02/02/18   Cathie HoopsYu, Amy V, PA-C  fluticasone (FLONASE) 50 MCG/ACT nasal spray Place 1 spray daily into both nostrils. Patient taking differently: Place 1 spray into both nostrils daily as needed for allergies.  10/14/17   Caccavale, Sophia, PA-C  ibuprofen (ADVIL,MOTRIN) 800 MG tablet Take 1 tablet (800 mg total) by mouth every 8 (eight) hours as needed for fever, headache or moderate pain. 01/20/18   Gilda CreasePollina, Christopher J, Laurie Fields  ipratropium-albuterol (DUONEB) 0.5-2.5 (3) MG/3ML SOLN Take 3 mLs by nebulization every 4 (four) hours as needed. 04/08/18   Georgetta HaberBurky, Natalie B, NP  meclizine (ANTIVERT) 25 MG tablet Take 1 tablet (25 mg total) by mouth 3 (three) times daily as needed for dizziness. 09/26/17   Azalia Bilisampos, Kevin, Laurie Fields  montelukast (SINGULAIR) 10 MG tablet Take 1 tablet (10  mg total) by mouth at bedtime. 04/08/18   Georgetta Haber, NP  Rivaroxaban 15 & 20 MG TBPK Use as directed 15 mg in the mouth or throat 2 (two) times daily. Take as directed on package: Start with one 15mg  tablet by mouth twice a day with food. On Day 22, switch to one 20mg  tablet once a day with food. 03/25/18   Elvina Sidle, Laurie Fields  rizatriptan (MAXALT-MLT) 5 MG disintegrating tablet Take 1 tablet (5 mg total) as needed by mouth. May repeat in 2 hours if needed Patient taking differently: Take 5 mg as needed by mouth for migraine. May repeat in 2 hours if needed 10/08/17   Levert Feinstein, Laurie Fields  topiramate (TOPAMAX) 100 MG tablet Take 1 tablet (100 mg total) 2 (two) times daily by mouth. Titrating up to 100mg , BID. Patient taking differently: Take 100 mg by mouth 2  (two) times daily.  10/08/17   Levert Feinstein, Laurie Fields    Allergies Serevent [salmeterol]   REVIEW OF SYSTEMS  Negative except as noted here or in the History of Present Illness.   PHYSICAL EXAMINATION  Initial Vital Signs Blood pressure 101/75, pulse 63, temperature 98 F (36.7 C), temperature source Oral, resp. rate 17, height 5\' 5"  (1.651 m), weight 130.6 kg (288 lb), SpO2 99 %.  Examination General: Well-developed, well-nourished female in no acute distress; appearance consistent with age of record HENT: normocephalic; atraumatic Eyes: pupils equal, round and reactive to light; extraocular muscles intact Neck: supple Heart: regular rate and rhythm Lungs: clear to auscultation bilaterally Chest: Left lower lateral rib tenderness without deformity or crepitus, no rash seen, tenderness extends down into the left flank Abdomen: soft; nondistended; nontender; no masses or hepatosplenomegaly; bowel sounds present Extremities: No deformity; full range of motion; pulses normal Neurologic: Awake, alert and oriented; motor function intact in all extremities and symmetric; no facial droop Skin: Warm and dry Psychiatric: Normal mood and affect   RESULTS  Summary of this visit's results, reviewed by myself:   EKG Interpretation  Date/Time:    Ventricular Rate:    PR Interval:    QRS Duration:   QT Interval:    QTC Calculation:   R Axis:     Text Interpretation:        Laboratory Studies: Results for orders placed or performed during the hospital encounter of 05/11/18 (from the past 24 hour(s))  Urinalysis, Routine w reflex microscopic- may I&O cath if menses     Status: Abnormal   Collection Time: 05/11/18  3:45 AM  Result Value Ref Range   Color, Urine YELLOW YELLOW   APPearance CLEAR CLEAR   Specific Gravity, Urine 1.031 (H) 1.005 - 1.030   pH 5.0 5.0 - 8.0   Glucose, UA NEGATIVE NEGATIVE mg/dL   Hgb urine dipstick NEGATIVE NEGATIVE   Bilirubin Urine NEGATIVE NEGATIVE    Ketones, ur NEGATIVE NEGATIVE mg/dL   Protein, ur NEGATIVE NEGATIVE mg/dL   Nitrite NEGATIVE NEGATIVE   Leukocytes, UA NEGATIVE NEGATIVE   Imaging Studies: No results found.  ED COURSE and MDM  Nursing notes and initial vitals signs, including pulse oximetry, reviewed.  Vitals:   05/11/18 0244 05/11/18 0536  BP:  101/75  Pulse:  63  Resp:  17  Temp:  98 F (36.7 C)  TempSrc:  Oral  SpO2:  99%  Weight: 130.6 kg (288 lb)   Height: 5\' 5"  (1.651 m)    There is no rash to suggest shingles and the pain extends to the left  flank which would be inconsistent with a dermatomal pattern.  I suspect her pain is musculoskeletal.  She is concerned that the pain may be due to ovarian cysts as she has had ovarian pain manifest in an atypical location in the past.  We will arrange for an outpatient pelvic ultrasound.  Consultation with the [facility] state controlled substances database reveals the patient has received 1 prescription for oxycodone in the past year.Marland Kitchen  PROCEDURES    ED DIAGNOSES     ICD-10-CM   1. Rib pain on left side R07.81        Paula Libra, Laurie Fields 05/11/18 [phone]

## 2018-05-11 NOTE — ED Triage Notes (Signed)
Patient is complaining of being sleepy, loss of appetite, and left flank pain. These symptoms started 3 days ago. Patient has taken tylenol for these symptoms.

## 2018-05-20 ENCOUNTER — Emergency Department (HOSPITAL_COMMUNITY): Payer: Medicare HMO

## 2018-05-20 ENCOUNTER — Emergency Department (HOSPITAL_COMMUNITY)
Admission: EM | Admit: 2018-05-20 | Discharge: 2018-05-20 | Disposition: A | Discharge status: Home / Self Care | Payer: Medicare HMO | Attending: Emergency Medicine | Admitting: Emergency Medicine

## 2018-05-20 ENCOUNTER — Other Ambulatory Visit: Payer: Self-pay

## 2018-05-20 ENCOUNTER — Encounter (HOSPITAL_COMMUNITY): Payer: Self-pay

## 2018-05-20 DIAGNOSIS — R1032 Left lower quadrant pain: Secondary | ICD-10-CM

## 2018-05-20 DIAGNOSIS — J45909 Unspecified asthma, uncomplicated: Secondary | ICD-10-CM | POA: Insufficient documentation

## 2018-05-20 DIAGNOSIS — Z79899 Other long term (current) drug therapy: Secondary | ICD-10-CM | POA: Diagnosis not present

## 2018-05-20 DIAGNOSIS — R109 Unspecified abdominal pain: Secondary | ICD-10-CM | POA: Diagnosis present

## 2018-05-20 DIAGNOSIS — N83292 Other ovarian cyst, left side: Secondary | ICD-10-CM | POA: Diagnosis not present

## 2018-05-20 DIAGNOSIS — N83202 Unspecified ovarian cyst, left side: Secondary | ICD-10-CM

## 2018-05-20 LAB — URINALYSIS, ROUTINE W REFLEX MICROSCOPIC
Bilirubin Urine: NEGATIVE
Glucose, UA: NEGATIVE mg/dL
Hgb urine dipstick: NEGATIVE
Ketones, ur: NEGATIVE mg/dL
LEUKOCYTES UA: NEGATIVE
NITRITE: NEGATIVE
Protein, ur: NEGATIVE mg/dL
SPECIFIC GRAVITY, URINE: 1.01 (ref 1.005–1.030)
pH: 6 (ref 5.0–8.0)

## 2018-05-20 LAB — COMPREHENSIVE METABOLIC PANEL
ALK PHOS: 67 U/L (ref 38–126)
ALT: 9 U/L (ref 0–44)
ANION GAP: 4 — AB (ref 5–15)
AST: 13 U/L — ABNORMAL LOW (ref 15–41)
Albumin: 3.8 g/dL (ref 3.5–5.0)
BUN: 11 mg/dL (ref 6–20)
CALCIUM: 8.7 mg/dL — AB (ref 8.9–10.3)
CO2: 27 mmol/L (ref 22–32)
Chloride: 109 mmol/L (ref 98–111)
Creatinine, Ser: 0.83 mg/dL (ref 0.44–1.00)
GFR calc non Af Amer: >60 mL/min (ref >60)
Glucose, Bld: 110 mg/dL — ABNORMAL HIGH (ref 70–99)
Potassium: 3.7 mmol/L (ref 3.5–5.1)
SODIUM: 140 mmol/L (ref 135–145)
TOTAL PROTEIN: 7.6 g/dL (ref 6.5–8.1)
Total Bilirubin: 0.4 mg/dL (ref 0.3–1.2)

## 2018-05-20 LAB — CBC
HCT: 39.1 % (ref 36.0–46.0)
HEMOGLOBIN: 12.7 g/dL (ref 12.0–15.0)
MCH: 28.7 pg (ref 26.0–34.0)
MCHC: 32.5 g/dL (ref 30.0–36.0)
MCV: 88.3 fL (ref 78.0–100.0)
Platelets: 354 10*3/uL (ref 150–400)
RBC: 4.43 MIL/uL (ref 3.87–5.11)
RDW: 13.4 % (ref 11.5–15.5)
WBC: 7.4 10*3/uL (ref 4.0–10.5)

## 2018-05-20 LAB — I-STAT BETA HCG BLOOD, ED (MC, WL, AP ONLY)

## 2018-05-20 LAB — LIPASE, BLOOD: Lipase: 31 U/L (ref 11–51)

## 2018-05-20 MED ORDER — IOPAMIDOL (ISOVUE-300) INJECTION 61%
INTRAVENOUS | Status: AC
  Filled 2018-05-20: qty 100

## 2018-05-20 MED ORDER — IOPAMIDOL (ISOVUE-300) INJECTION 61%
100.0000 mL | Freq: Once | INTRAVENOUS | Status: AC | PRN
  Administered 2018-05-20: 100 mL via INTRAVENOUS

## 2018-05-20 MED ORDER — NAPROXEN 375 MG PO TABS: ORAL_TABLET | ORAL | 0 refills | Status: DC

## 2018-05-20 NOTE — ED Triage Notes (Signed)
Patient c/o left flank, left lower abdominal and pelvic pain since 05/05/18. Patient was told to come to Radiology for an US and was told she had to have an appointment for the US and did not get the US done. Patient reports increased pain.

## 2018-05-20 NOTE — ED Notes (Signed)
Pt aware that urine sample is needed. Pt unable at this time. 

## 2018-05-20 NOTE — Discharge Instructions (Addendum)
If your abdominal pain worsens or you develop vomiting, fever, or any other new/concerning symptoms return to the ER for evaluation.  Otherwise follow-up with your primary care physician and your OB/GYN.

## 2018-05-20 NOTE — ED Notes (Signed)
Lab is refusing to process the urine that was sent down on patient.  Urine had pt correct labels on specimen themselves but the paper requisition that was in the bag was not correct.  They are stating that for that fact it would needed to be recollected completely.  RN notified.

## 2018-05-20 NOTE — ED Provider Notes (Signed)
[facility]-EMERGENCY DEPT Provider Note   CSN: [id number] Arrival date & time: 05/20/18  1542     History   Chief Complaint Chief Complaint  Patient presents with  . Abdominal Pain  . Pelvic Pain  . Flank Pain    HPI Laurie Fields is a 32 y.o. female.  HPI  32 year old female presents with left lower quadrant abdominal pain.  Has been ongoing for about 2 weeks.  Was seen here on the 16th and try to have an outpatient ultrasound arranged because she is concerned this another ovarian cyst.  However she has been unable to get this appointment as an outpatient.  The pain is become more more severe.  Now it is even radiating a little bit down her left anterior thigh.  She is also having midline low back pain.  No weakness or numbness in her extremities.  No incontinence.  No dysuria, fever, vaginal bleeding or discharge or vaginal pain.  This feels similar to severe ovarian cyst she is had before requiring surgery.  Has tried left over naproxen at home. Gets STI testing every 6-8 weeks and has repeatedly been negative.   Past Medical History:  Diagnosis Date  . Asthma    Hx intubation R/T asthma  . Blurred vision   . Depression   . Migraine   . Obesity   . Ovarian cyst   . Pseudotumor cerebri   . Vertigo     Patient Active Problem List   Diagnosis Date Noted  . Chronic migraine 10/08/2017  . Pseudotumor cerebri 10/08/2017    Past Surgical History:  Procedure Laterality Date  . ABDOMINAL HYSTERECTOMY    . carpel  tunnel release    . CESAREAN SECTION    . peritoneal cyst       OB History   None      Home Medications    Prior to Admission medications   Medication Sig Start Date End Date Taking? Authorizing Provider  albuterol (PROVENTIL HFA;VENTOLIN HFA) 108 (90 Base) MCG/ACT inhaler Inhale 2 puffs into the lungs every 6 (six) hours as needed for wheezing or shortness of breath. 02/02/18  Yes Yu, Amy V, PA-C  ibuprofen (ADVIL,MOTRIN)  800 MG tablet Take 1 tablet (800 mg total) by mouth every 8 (eight) hours as needed for fever, headache or moderate pain. 01/20/18  Yes Pollina, Canary Brim, MD  meclizine (ANTIVERT) 25 MG tablet Take 1 tablet (25 mg total) by mouth 3 (three) times daily as needed for dizziness. 09/26/17  Yes Azalia Bilis, MD  montelukast (SINGULAIR) 10 MG tablet Take 1 tablet (10 mg total) by mouth at bedtime. 04/08/18  Yes Burky, Dorene Grebe B, NP  oxyCODONE-acetaminophen (PERCOCET) 5-325 MG tablet Take 1 tablet by mouth every 6 (six) hours as needed for severe pain. 05/11/18  Yes Molpus, John, MD  rizatriptan (MAXALT-MLT) 5 MG disintegrating tablet Take 1 tablet (5 mg total) as needed by mouth. May repeat in 2 hours if needed Patient taking differently: Take 5 mg as needed by mouth for migraine. May repeat in 2 hours if needed 10/08/17  Yes Levert Feinstein, MD  topiramate (TOPAMAX) 100 MG tablet Take 1 tablet (100 mg total) 2 (two) times daily by mouth. Titrating up to 100mg , BID. Patient taking differently: Take 100 mg by mouth 2 (two) times daily.  10/08/17  Yes Levert Feinstein, MD  albuterol (PROVENTIL) (2.5 MG/3ML) 0.083% nebulizer solution Take 3 mLs (2.5 mg total) by nebulization every 6 (six) hours as needed for wheezing  or shortness of breath. Patient not taking: Reported on 05/20/2018 02/02/18   Belinda Fisher, PA-C  fluticasone [facility]) 50 MCG/ACT nasal spray Place 1 spray daily into both nostrils. Patient not taking: Reported on 05/20/2018 10/14/17   Caccavale, Sophia, PA-C  ipratropium-albuterol (DUONEB) 0.5-2.5 (3) MG/3ML SOLN Take 3 mLs by nebulization every 4 (four) hours as needed. Patient not taking: Reported on 05/20/2018 04/08/18   Linus Mako B, NP  naproxen (NAPROSYN) 375 MG tablet Take 1 tablet twice daily as needed for pain. 05/20/18   Pricilla Loveless, MD  Rivaroxaban 15 & 20 MG TBPK Use as directed 15 mg in the mouth or throat 2 (two) times daily. Take as directed on package: Start with one 15mg  tablet by mouth  twice a day with food. On Day 22, switch to one 20mg  tablet once a day with food. Patient not taking: Reported on 05/20/2018 03/25/18   Elvina Sidle, MD    Family History Family History  Problem Relation Age of Onset  . Hypertension Other   . Diabetes Other   . Cancer Other   . CAD Other   . Leukemia Other   . Graves' disease Mother   . Other Mother        low blood sugar  . Hypertension Father   . Diabetes Maternal Grandmother   . Colon polyps Maternal Grandmother   . Glaucoma Maternal Grandfather   . Cataracts Maternal Grandfather   . Prostate cancer Maternal Grandfather   . Diabetes Paternal Grandmother   . Cataracts Paternal Grandfather   . Prostate cancer Paternal Grandfather     Social History Social History   Tobacco Use  . Smoking status: Never Smoker  . Smokeless tobacco: Never Used  Substance Use Topics  . Alcohol use: No  . Drug use: No     Allergies   Serevent [salmeterol]   Review of Systems Review of Systems  Constitutional: Negative for fever.  Gastrointestinal: Positive for abdominal pain.  Genitourinary: Positive for flank pain. Negative for dysuria, vaginal bleeding, vaginal discharge and vaginal pain.  Musculoskeletal: Positive for back pain.  Neurological: Negative for weakness and numbness.  All other systems reviewed and are negative.    Physical Exam Updated Vital Signs BP (!) 139/91 (BP Location: Left Arm)   Pulse (!) 58   Temp 98.1 F (36.7 C) (Oral)   Resp 16   Ht 5\' 5"  (1.651 m)   Wt 130.6 kg (288 lb)   SpO2 99%   BMI 47.93 kg/m   Physical Exam  Constitutional: She is oriented to person, place, and time. She appears well-developed and well-nourished.  Non-toxic appearance. She does not appear ill.  obese  HENT:  Head: Normocephalic and atraumatic.  Right Ear: External ear normal.  Left Ear: External ear normal.  Nose: Nose normal.  Eyes: Right eye exhibits no discharge. Left eye exhibits no discharge.    Cardiovascular: Normal rate, regular rhythm and normal heart sounds.  Pulmonary/Chest: Effort normal and breath sounds normal.  Abdominal: Soft. There is tenderness in the suprapubic area and left lower quadrant. There is no CVA tenderness.  Musculoskeletal:       Lumbar back: She exhibits tenderness.  Neurological: She is alert and oriented to person, place, and time.  5/5 strength in BLE. Normal gross sensation  Skin: Skin is warm and dry.  Nursing note and vitals reviewed.    ED Treatments / Results  Labs (all labs ordered are listed, but only abnormal results are displayed) Labs  Reviewed  COMPREHENSIVE METABOLIC PANEL - Abnormal; Notable for the following components:      Result Value   Glucose, Bld 110 (*)    Calcium 8.7 (*)    AST 13 (*)    Anion gap 4 (*)    All other components within normal limits  URINALYSIS, ROUTINE W REFLEX MICROSCOPIC - Abnormal; Notable for the following components:   Color, Urine STRAW (*)    All other components within normal limits  LIPASE, BLOOD  CBC  I-STAT BETA HCG BLOOD, ED ([facility], [facility], [facility] ONLY)    EKG None  Radiology [place] Transvaginal Non-ob  Result Date: 05/20/2018 CLINICAL DATA:  Left lower quadrant pain. Rule out ovarian torsion. Postop hysterectomy EXAM: TRANSABDOMINAL AND TRANSVAGINAL ULTRASOUND OF PELVIS DOPPLER ULTRASOUND OF OVARIES TECHNIQUE: Both transabdominal and transvaginal ultrasound examinations of the pelvis were performed. Transabdominal technique was performed for global imaging of the pelvis including uterus, ovaries, adnexal regions, and pelvic cul-de-sac. It was necessary to proceed with endovaginal exam following the transabdominal exam to visualize the ovaries. Color and duplex Doppler ultrasound was utilized to evaluate blood flow to the ovaries. COMPARISON:  CT abdomen pelvis 01/06/2018 FINDINGS: Uterus . Surgically absent uterus Right ovary Measurements: 5.6 x 2.9 x 6.7 cm. Multiple follicles. Small complex cysts are  present in the right ovary. Negative for mass lesion. Left ovary Measurements: . Not visualized Pulsed Doppler evaluation of right ovary demonstrates normal low-resistance arterial and venous waveforms. Other findings No abnormal free fluid. IMPRESSION: Postop hysterectomy.  Left ovary not visualized Multiple follicles and complex cyst on the right ovary. Normal blood flow to the right ovary by Doppler. Electronically Signed   By: Marlan Palauharles  Clark M.D.   On: 05/20/2018 19:14   [place] Pelvis Complete  Result Date: 05/20/2018 CLINICAL DATA:  Left lower quadrant pain. Rule out ovarian torsion. Postop hysterectomy EXAM: TRANSABDOMINAL AND TRANSVAGINAL ULTRASOUND OF PELVIS DOPPLER ULTRASOUND OF OVARIES TECHNIQUE: Both transabdominal and transvaginal ultrasound examinations of the pelvis were performed. Transabdominal technique was performed for global imaging of the pelvis including uterus, ovaries, adnexal regions, and pelvic cul-de-sac. It was necessary to proceed with endovaginal exam following the transabdominal exam to visualize the ovaries. Color and duplex Doppler ultrasound was utilized to evaluate blood flow to the ovaries. COMPARISON:  CT abdomen pelvis 01/06/2018 FINDINGS: Uterus . Surgically absent uterus Right ovary Measurements: 5.6 x 2.9 x 6.7 cm. Multiple follicles. Small complex cysts are present in the right ovary. Negative for mass lesion. Left ovary Measurements: . Not visualized Pulsed Doppler evaluation of right ovary demonstrates normal low-resistance arterial and venous waveforms. Other findings No abnormal free fluid. IMPRESSION: Postop hysterectomy.  Left ovary not visualized Multiple follicles and complex cyst on the right ovary. Normal blood flow to the right ovary by Doppler. Electronically Signed   By: Marlan Palauharles  Clark M.D.   On: 05/20/2018 19:14   Ct Abdomen Pelvis W Contrast  Result Date: 05/20/2018 CLINICAL DATA:  Left flank and left lower abdominopelvic pain for 2 weeks. EXAM: CT ABDOMEN  AND PELVIS WITH CONTRAST TECHNIQUE: Multidetector CT imaging of the abdomen and pelvis was performed using the standard protocol following bolus administration of intravenous contrast. CONTRAST:  100mL ISOVUE-300 IOPAMIDOL (ISOVUE-300) INJECTION 61% COMPARISON:  Pelvic sonogram from earlier today. 01/06/2018 CT abdomen/pelvis. FINDINGS: Lower chest: Left lower lobe 3 mm solid pulmonary nodule (series 4/image 23), stable since 11/01/2016 CT, considered benign. No acute abnormality at the lung bases. Hepatobiliary: Normal liver size. No liver mass. Normal gallbladder with no radiopaque  cholelithiasis. No biliary ductal dilatation. Pancreas: Normal, with no mass or duct dilation. Spleen: Normal size. No mass. Adrenals/Urinary Tract: Normal adrenals. Normal kidneys with no hydronephrosis and no renal mass. Normal nondistended bladder. Stomach/Bowel: Normal non-distended stomach. Normal caliber small bowel with no small bowel wall thickening. Normal appendix. Normal large bowel with no diverticulosis, large bowel wall thickening or pericolonic fat stranding. Vascular/Lymphatic: Normal caliber abdominal aorta. Patent portal, splenic, hepatic and renal veins. No pathologically enlarged lymph nodes in the abdomen or pelvis. Reproductive: Status post hysterectomy, with no abnormal findings at the vaginal cuff. Simple 2.3 cm left adnexal cyst (series 2/image 65). No right adnexal mass. Other: No pneumoperitoneum, ascites or focal fluid collection. Musculoskeletal: No aggressive appearing focal osseous lesions. Mild thoracolumbar spondylosis. IMPRESSION: 1. No acute abnormality. No evidence of bowel obstruction or acute bowel inflammation. 2. Simple 2.3 cm left adnexal cyst. No follow-up is required. This recommendation follows ACR consensus guidelines: White Paper of the ACR Incidental Findings Committee II on Adnexal Findings. J Am Coll Radiol [phone]. Electronically Signed   By: Delbert Phenix M.D.   On: 05/20/2018  21:54   [place] Art/ven Flow Abd Pelv Doppler  Result Date: 05/20/2018 CLINICAL DATA:  Left lower quadrant pain. Rule out ovarian torsion. Postop hysterectomy EXAM: TRANSABDOMINAL AND TRANSVAGINAL ULTRASOUND OF PELVIS DOPPLER ULTRASOUND OF OVARIES TECHNIQUE: Both transabdominal and transvaginal ultrasound examinations of the pelvis were performed. Transabdominal technique was performed for global imaging of the pelvis including uterus, ovaries, adnexal regions, and pelvic cul-de-sac. It was necessary to proceed with endovaginal exam following the transabdominal exam to visualize the ovaries. Color and duplex Doppler ultrasound was utilized to evaluate blood flow to the ovaries. COMPARISON:  CT abdomen pelvis 01/06/2018 FINDINGS: Uterus . Surgically absent uterus Right ovary Measurements: 5.6 x 2.9 x 6.7 cm. Multiple follicles. Small complex cysts are present in the right ovary. Negative for mass lesion. Left ovary Measurements: . Not visualized Pulsed Doppler evaluation of right ovary demonstrates normal low-resistance arterial and venous waveforms. Other findings No abnormal free fluid. IMPRESSION: Postop hysterectomy.  Left ovary not visualized Multiple follicles and complex cyst on the right ovary. Normal blood flow to the right ovary by Doppler. Electronically Signed   By: Marlan Palau M.D.   On: 05/20/2018 19:14    Procedures Procedures (including critical care time)  Medications Ordered in ED Medications  iopamidol (ISOVUE-300) 61 % injection (has no administration in time range)  iopamidol (ISOVUE-300) 61 % injection 100 mL (100 mLs Intravenous Contrast Given 05/20/18 2132)     Initial Impression / Assessment and Plan / ED Course  I have reviewed the triage vital signs and the nursing notes.  Pertinent labs & imaging results that were available during my care of the patient were reviewed by me and considered in my medical decision making (see chart for details).     Given degree of  patient's reported pain, ultrasound obtained to help rule out significant ovarian cyst versus torsion.  This is equivocal as the ovary cannot be seen.  Thus CT obtained.  There is no acute pathology besides a simple ovarian cyst.  She notes some symptoms going down her right anterior thigh for a few centimeters but I doubt this is sciatic given the back pain is less prominent than the abdominal pain.  She also has normal neuro exam.  I will refill NSAIDs at her request and have her follow-up with PCP.  She declined pelvic exam but has low STI concern at this time.  Final Clinical Impressions(s) / ED Diagnoses   Final diagnoses:  Abdominal pain, LLQ (left lower quadrant)  Left ovarian cyst    ED Discharge Orders        Ordered    naproxen (NAPROSYN) 375 MG tablet     05/20/18 2213       Pricilla Loveless, MD 05/20/18 2222

## 2018-07-26 ENCOUNTER — Encounter (HOSPITAL_COMMUNITY): Payer: Self-pay | Admitting: *Deleted

## 2018-07-26 ENCOUNTER — Ambulatory Visit (INDEPENDENT_AMBULATORY_CARE_PROVIDER_SITE_OTHER)
Admission: EM | Admit: 2018-07-26 | Discharge: 2018-07-26 | Disposition: A | Discharge status: Home / Self Care | Payer: Medicare HMO | Source: Home / Self Care | Attending: Family Medicine | Admitting: Family Medicine

## 2018-07-26 ENCOUNTER — Emergency Department (HOSPITAL_COMMUNITY)
Admission: EM | Admit: 2018-07-26 | Discharge: 2018-07-26 | Disposition: A | Discharge status: Home / Self Care | Payer: Medicare HMO | Attending: Emergency Medicine | Admitting: Emergency Medicine

## 2018-07-26 ENCOUNTER — Other Ambulatory Visit: Payer: Self-pay

## 2018-07-26 ENCOUNTER — Encounter (HOSPITAL_COMMUNITY): Payer: Self-pay | Admitting: Emergency Medicine

## 2018-07-26 ENCOUNTER — Emergency Department (HOSPITAL_COMMUNITY): Payer: Medicare HMO

## 2018-07-26 ENCOUNTER — Emergency Department (HOSPITAL_COMMUNITY)
Admission: EM | Admit: 2018-07-26 | Discharge: 2018-07-26 | Disposition: A | Discharge status: Home / Self Care | Payer: Medicare HMO | Source: Home / Self Care | Attending: Emergency Medicine | Admitting: Emergency Medicine

## 2018-07-26 DIAGNOSIS — Z79899 Other long term (current) drug therapy: Secondary | ICD-10-CM

## 2018-07-26 DIAGNOSIS — M545 Low back pain, unspecified: Secondary | ICD-10-CM

## 2018-07-26 DIAGNOSIS — M549 Dorsalgia, unspecified: Secondary | ICD-10-CM | POA: Diagnosis present

## 2018-07-26 DIAGNOSIS — J45909 Unspecified asthma, uncomplicated: Secondary | ICD-10-CM | POA: Diagnosis not present

## 2018-07-26 DIAGNOSIS — M5442 Lumbago with sciatica, left side: Secondary | ICD-10-CM | POA: Insufficient documentation

## 2018-07-26 DIAGNOSIS — R159 Full incontinence of feces: Secondary | ICD-10-CM

## 2018-07-26 DIAGNOSIS — M5441 Lumbago with sciatica, right side: Secondary | ICD-10-CM | POA: Diagnosis not present

## 2018-07-26 DIAGNOSIS — R32 Unspecified urinary incontinence: Secondary | ICD-10-CM

## 2018-07-26 DIAGNOSIS — G932 Benign intracranial hypertension: Secondary | ICD-10-CM | POA: Insufficient documentation

## 2018-07-26 MED ORDER — ONDANSETRON HCL 4 MG/2ML IJ SOLN
4.0000 mg | INTRAMUSCULAR | Status: AC
  Administered 2018-07-26: 4 mg via INTRAVENOUS

## 2018-07-26 MED ORDER — ONDANSETRON 4 MG PO TBDP
4.0000 mg | ORAL_TABLET | Freq: Once | ORAL | Status: DC
  Filled 2018-07-26: qty 1

## 2018-07-26 MED ORDER — CYCLOBENZAPRINE HCL 10 MG PO TABS: 5.0000 mg | ORAL_TABLET | Freq: Two times a day (BID) | ORAL | 0 refills | Status: DC | PRN

## 2018-07-26 MED ORDER — HYDROMORPHONE HCL 1 MG/ML IJ SOLN
0.5000 mg | Freq: Once | INTRAMUSCULAR | Status: AC
  Administered 2018-07-26: 0.5 mg via INTRAVENOUS

## 2018-07-26 MED ORDER — OXYCODONE-ACETAMINOPHEN 5-325 MG PO TABS
2.0000 | ORAL_TABLET | Freq: Once | ORAL | Status: DC
  Filled 2018-07-26: qty 2

## 2018-07-26 MED ORDER — METHOCARBAMOL 500 MG PO TABS: 500.0000 mg | ORAL_TABLET | Freq: Two times a day (BID) | ORAL | 0 refills | Status: DC

## 2018-07-26 MED ORDER — OXYCODONE-ACETAMINOPHEN 5-325 MG PO TABS: 1.0000 | ORAL_TABLET | Freq: Four times a day (QID) | ORAL | 0 refills | Status: DC | PRN

## 2018-07-26 NOTE — ED Provider Notes (Signed)
[facility]   [id number] 07/26/18 Arrival Time: 1247  CC: Back pain  SUBJECTIVE: History from: patient. Fonnie MuVictoria Sluder is a 32 y.o. female patient [facility] with complaint of low back pain that began a couple of months ago that has gotten progressively worse within the last few days.  Patient states her symptoms began after she broke her left great toe.  States she walked with a limp following that injury that resulted in her back pain.  Localizes the pain to the low back.  Describes the pain as constant, achy and sharp in character.  Has tried prednisone and meloxicam without relief.  Denies worsening symptoms.  Denies similar symptoms in the past.  Complains of 2 episodes of urinary incontinence, 2 episodes of bowel incontinence within the last 2 days, numbness in LLE, and difficulty sleeping.  Describes incontinence without urgency.  Denies fever, chills, saddle paresthesia, abdominal pain, dysuria, erythema, ecchymosis, effusion, or tingling.      X-rays done of back on 07/16/18 with Dewaine CongerMurphy Weiner and diagnosed with muscle spasm.    [facility] and offered robaxin and tordol.  Patient refused.    ROS: As per HPI.  Past Medical History:  Diagnosis Date  . Asthma    Hx intubation R/T asthma  . Blurred vision   . Depression   . Migraine   . Obesity   . Ovarian cyst   . Pseudotumor cerebri   . Vertigo    Past Surgical History:  Procedure Laterality Date  . ABDOMINAL HYSTERECTOMY    . carpel  tunnel release    . CESAREAN SECTION    . peritoneal cyst     Allergies  Allergen Reactions  . Serevent [Salmeterol] Hives, Nausea And Vomiting and Other (See Comments)    Hallucinations Pt states tolerates Atrovent well   No current facility-administered medications on file prior to encounter.    Current Outpatient Medications on File Prior to Encounter  Medication Sig Dispense Refill  . MELOXICAM PO Take by mouth.    .  predniSONE (STERAPRED UNI-PAK 21 TAB) 5 MG (21) TBPK tablet Take 5 mg by mouth daily.    Marland Kitchen. topiramate (TOPAMAX) 100 MG tablet Take 1 tablet (100 mg total) 2 (two) times daily by mouth. Titrating up to 100mg , BID. (Patient taking differently: Take 100 mg by mouth 2 (two) times daily. ) 60 tablet 11  . naproxen (NAPROSYN) 375 MG tablet Take 1 tablet twice daily as needed for pain. (Patient not taking: Reported on 07/26/2018) 15 tablet 0  . rizatriptan (MAXALT-MLT) 5 MG disintegrating tablet Take 1 tablet (5 mg total) as needed by mouth. May repeat in 2 hours if needed (Patient taking differently: Take 5 mg as needed by mouth for migraine. May repeat in 2 hours if needed) 15 tablet 12   Social History   Socioeconomic History  . Marital status: Single    Spouse name: Not on file  . Number of children: 4  . Years of education: college  . Highest education level: Not on file  Occupational History  . Occupation: Merchandiser, [employer]  Social Needs  . Financial resource strain: Not on file  . Food insecurity:    Worry: Not on file    Inability: Not on file  . Transportation needs:    Medical: Not on file    Non-medical: Not on file  Tobacco Use  . Smoking status: Never Smoker  . Smokeless tobacco: Never Used  Substance and  Sexual Activity  . Alcohol use: No  . Drug use: No  . Sexual activity: Not on file  Lifestyle  . Physical activity:    Days per week: Not on file    Minutes per session: Not on file  . Stress: Not on file  Relationships  . Social connections:    Talks on phone: Not on file    Gets together: Not on file    Attends religious service: Not on file    Active member of club or organization: Not on file    Attends meetings of clubs or organizations: Not on file    Relationship status: Not on file  . Intimate partner violence:    Fear of current or ex partner: Not on file    Emotionally abused: Not on file    Physically abused: Not on file    Forced sexual activity: Not on file    Other Topics Concern  . Not on file  Social History Narrative   Lives at home with her children. She has four living sons. She had one daughter that passed away at age 16 with Aplastic anemia.   Right-handed.   2-3 cups of caffeine weekly.   Family History  Problem Relation Age of Onset  . Hypertension Other   . Diabetes Other   . Cancer Other   . CAD Other   . Leukemia Other   . Graves' disease Mother   . Other Mother        low blood sugar  . Hypertension Father   . Diabetes Maternal Grandmother   . Colon polyps Maternal Grandmother   . Glaucoma Maternal Grandfather   . Cataracts Maternal Grandfather   . Prostate cancer Maternal Grandfather   . Diabetes Paternal Grandmother   . Cataracts Paternal Grandfather   . Prostate cancer Paternal Grandfather     OBJECTIVE:  Vitals:   07/26/18 1330  BP: 109/79  Pulse: 86  Resp: 16  Temp: 98.8 F (37.1 C)  TempSrc: Oral  SpO2: 100%    General appearance: Alert; appears uncomfortable Head: NCAT Lungs: CTA bilaterally Heart: RRR.  Clear S1 and S2 without murmur, gallops, or rubs.  Radial pulses 2+ bilaterally. Musculoskeletal: Lumbar spine  Inspection: Skin warm, dry, clear and intact without obvious erythema, effusion, or ecchymosis.  Palpation: Diffusely tender about the lower l-spine, and bilateral paravertebral muscles ROM: LROM about the l-spine Strength: 5/5 shld abduction, 5/5 shld adduction, 5/5 elbow flexion, 5/5 elbow extension, 5/5 grip strength, 5/5 hip flexion, 5/5 knee abduction, 5/5 knee adduction, 5/5 knee flexion, 5/5 knee extension, 5/5 dorsiflexion, 5/5 plantar flexion Skin: warm and dry Neurologic: sitting in wheelchair; Sensation intact about the upper/ lower extremities Psychological: alert and cooperative; normal mood and affect  ASSESSMENT & PLAN:  1. Acute bilateral low back pain with left-sided sciatica   2. Urinary incontinence, unspecified type   3. Incontinence of feces, unspecified fecal  incontinence type    No orders of the defined types were placed in this encounter.  Due to severity of symptoms and hx of urinary/bowel incontinence associated with back pain I am recommending further evaluation and management in the ED Patient aware and in agreement with this plan  Reviewed expectations re: course of current medical issues. Questions answered. Outlined signs and symptoms indicating need for more acute intervention. Patient verbalized understanding. After Visit Summary given.    Rennis Harding, PA-C 07/26/18 1438

## 2018-07-26 NOTE — ED Notes (Signed)
Pt reports that she wants it documented in her medical record that she will "sue [place] is she gets an MRI somewhere else and it shows so much as a slightly bulging disc." Pt left without incident.

## 2018-07-26 NOTE — ED Notes (Signed)
Pt alert and oriented in NAD. Pt verbalized understanding of discharge instructions. 

## 2018-07-26 NOTE — ED Notes (Signed)
Patient ambulatory to room from triage.  

## 2018-07-26 NOTE — Progress Notes (Signed)
Per pt's EDP, pt does not need PIV; pt to be discharged home soon.

## 2018-07-26 NOTE — ED Notes (Signed)
Patient returned from MRI.

## 2018-07-26 NOTE — ED Notes (Signed)
Upon introducing myself to patient to discharge patient, patient asked to speak with someone in charge. Patient stated, "I want it documented so that if it is found that I have a bulging disk or anything else I'm going to sue [place]." This writer expressed understanding and asked Jake, CN to bedside.

## 2018-07-26 NOTE — ED Provider Notes (Signed)
[facility] EMERGENCY DEPARTMENT Provider Note   CSN: [id number] Arrival date & time: 07/26/18  1447     History   Chief Complaint Chief Complaint  Patient presents with  . Back Pain    HPI Laurie Fields is a 32 y.o. female who presents for evaluation of low back pain.  Patient states that she has had ongoing problems with her lower back.  She states that this is been going on for a long time secondary to a toe injury.  She was limping for some time and started having problems with her lower back.  She began having pain that was radiating into her buttocks. She says that she has had multiple rounds of steroid and anti-inflammatories that seem to improve it for short time however it always comes back.  She was seen by orthopedics recently and again started on a prednisone taper meloxicam however she is having worsening pain.  Patient neglected to inform me that she was seen earlier today at [facility] emergency department by one of our providers and was discharged.  She then presented to the urgent care where she complained of bowel and bladder incontinence and was sent to the emergency department for evaluation.  She also complains of saddle anesthesia to me but denies any leg weakness.  She says that she is extremely uncomfortable and cannot find a position of comfort to sleep.  She states that her pain is just getting worse and worse and she wants to know what is going on.  She is a history of pseudotumor cerebri however she has not had a therapeutic tap since December 2018  HPI  Past Medical History:  Diagnosis Date  . Asthma    Hx intubation R/T asthma  . Blurred vision   . Depression   . Migraine   . Obesity   . Ovarian cyst   . Pseudotumor cerebri   . Vertigo     Patient Active Problem List   Diagnosis Date Noted  . Chronic migraine 10/08/2017  . Pseudotumor cerebri 10/08/2017    Past Surgical History:  Procedure Laterality Date  . ABDOMINAL  HYSTERECTOMY    . carpel  tunnel release    . CESAREAN SECTION    . peritoneal cyst       OB History   None      Home Medications    Prior to Admission medications   Medication Sig Start Date End Date Taking? Authorizing Provider  MELOXICAM PO Take by mouth.    [provider]  naproxen (NAPROSYN) 375 MG tablet Take 1 tablet twice daily as needed for pain. Patient not taking: Reported on 07/26/2018 05/20/18   Pricilla Loveless, MD  predniSONE (STERAPRED UNI-PAK 21 TAB) 5 MG (21) TBPK tablet Take 5 mg by mouth daily.    [provider]  rizatriptan (MAXALT-MLT) 5 MG disintegrating tablet Take 1 tablet (5 mg total) as needed by mouth. May repeat in 2 hours if needed Patient taking differently: Take 5 mg as needed by mouth for migraine. May repeat in 2 hours if needed 10/08/17   Levert Feinstein, MD  topiramate (TOPAMAX) 100 MG tablet Take 1 tablet (100 mg total) 2 (two) times daily by mouth. Titrating up to 100mg , BID. Patient taking differently: Take 100 mg by mouth 2 (two) times daily.  10/08/17   Levert Feinstein, MD    Family History Family History  Problem Relation Age of Onset  . Hypertension Other   . Diabetes Other   .  Cancer Other   . CAD Other   . Leukemia Other   . Graves' disease Mother   . Other Mother        low blood sugar  . Hypertension Father   . Diabetes Maternal Grandmother   . Colon polyps Maternal Grandmother   . Glaucoma Maternal Grandfather   . Cataracts Maternal Grandfather   . Prostate cancer Maternal Grandfather   . Diabetes Paternal Grandmother   . Cataracts Paternal Grandfather   . Prostate cancer Paternal Grandfather     Social History Social History   Tobacco Use  . Smoking status: Never Smoker  . Smokeless tobacco: Never Used  Substance Use Topics  . Alcohol use: No  . Drug use: No     Allergies   Serevent [salmeterol]   Review of Systems Review of Systems   Ten systems reviewed and are negative for acute change,  except as noted in the HPI.   Physical Exam Updated Vital Signs BP 108/69 (BP Location: Right Arm)   Pulse 75   Temp 99.1 F (37.3 C) (Oral)   Resp 16   SpO2 100%   Physical Exam  Constitutional: She is oriented to person, place, and time. She appears well-developed and well-nourished. No distress.  HENT:  Head: Normocephalic and atraumatic.  Eyes: Conjunctivae are normal. No scleral icterus.  Neck: Normal range of motion.  Cardiovascular: Normal rate, regular rhythm and normal heart sounds. Exam reveals no gallop and no friction rub.  No murmur heard. Pulmonary/Chest: Effort normal and breath sounds normal. No respiratory distress.  Abdominal: Soft. Bowel sounds are normal. She exhibits no distension and no mass. There is no tenderness. There is no guarding.  Musculoskeletal:       Lumbar back: She exhibits bony tenderness, pain and spasm.       Back:  Limited range of motion of the lumbar spine.  She has midline spinal tenderness and tenderness of the bilateral lumbar paraspinal muscles, across the sacrum and buttocks.  Strength is 5 out of 5 in the lower extremities including dorsi and plantar flexion, extension, flexion, abduction and adduction of the hips.   Neurological: She is alert and oriented to person, place, and time. A sensory deficit is present.  Unable to differentiate sharp and dull on the perineum Rectal tone normal  Skin: Skin is warm and dry. She is not diaphoretic.  Psychiatric: Her behavior is normal.  Nursing note and vitals reviewed.    ED Treatments / Results  Labs (all labs ordered are listed, but only abnormal results are displayed) Labs Reviewed - No data to display  EKG None  Radiology No results found.  Procedures Procedures (including critical care time)  Medications Ordered in ED Medications - No data to display   Initial Impression / Assessment and Plan / ED Course  I have reviewed the triage vital signs and the nursing  notes.  Pertinent labs & imaging results that were available during my care of the patient were reviewed by me and considered in my medical decision making (see chart for details).     Patient's MRI negative.  I personally reviewed the films. She does not appear to have any bulging disks, masses, signs of cauda equina or other emergent cause of her back pain.  She is ambulatory.  She has had no episodes of bowel or bladder incontinence here in the emergency department.  I suspect that this is musculoskeletal and she does have some advanced degenerative changes.  Patient states  that she has lost 80 pounds but used to weigh about 400.  I discussed home care and supportive care with the patient.  Also discussed return precautions.  Final Clinical Impressions(s) / ED Diagnoses   Final diagnoses:  Acute bilateral low back pain without sciatica    ED Discharge Orders    None       Arthor CaptainHarris, Jurnie Garritano, PA-C 07/26/18 2250    Terrilee FilesButler, Michael C, MD 07/27/18 1014

## 2018-07-26 NOTE — ED Triage Notes (Signed)
Pt ambulatory to triage says that she has been having left and right sided back pain that radiates down her legs. She has seen ortho doctor about the same, told her that she was having spasms. No meds PTA, pt drove herself here. She says that she has had difficulty walking since she broke her left big toe.

## 2018-07-26 NOTE — Discharge Instructions (Addendum)
SEEK IMMEDIATE MEDICAL ATTENTION IF: New numbness, tingling, weakness, or problem with the use of your arms or legs.  Severe back pain not relieved with medications.  Change in bowel or bladder control.  Increasing pain in any areas of the body (such as chest or abdominal pain).  Shortness of breath, dizziness or fainting.  Nausea (feeling sick to your stomach), vomiting, fever, or sweats.  

## 2018-07-26 NOTE — Discharge Instructions (Addendum)
1. Medications: robaxin, continue home anti-inflammatories, usual home medications 2. Treatment: rest, drink plenty of fluids, gentle stretching as discussed, alternate ice and heat 3. Follow Up: Please followup with your primary doctor and or Orthopedist in 3 days for discussion of your diagnoses and further evaluation after today's visit; if you do not have a primary care doctor use the resource guide provided to find one;  Return to the ER for worsening back pain, difficulty walking, loss of bowel or bladder control or other concerning symptoms

## 2018-07-26 NOTE — ED Notes (Signed)
Patient refused prescription, vital signs, and to sign for her discharge

## 2018-07-26 NOTE — ED Provider Notes (Signed)
[facility]-EMERGENCY DEPT Provider Note   CSN: [id number] Arrival date & time: 07/26/18  0102     History   Chief Complaint Chief Complaint  Patient presents with  . Back Pain    HPI Laurie Fields is a 32 y.o. female with a hx of asthma, depression, obesity, pseudotumor cerebri presents to the Emergency Department complaining of gradual, persistent, progressively worsening midline back pain onset May 2019.  Pt reports the pain began after she fractured her toe and was limping.  Pt reports this was considered a workers comp injury and it was several months before she was evaluated.  Pt reports she saw [facility] last week for several x-rays and was diagnosed with muscle spasms.  She reports her x-rays were normal at that time.  She denies falls, known trauma, IVDU, history of cancer or anticoagulant usage.  Pt reports she is not taking any antiinflammatories or muscle relaxer.  She reports several courses of prednisone for this.  Pt reports that walking makes the pain worse and nothing particularly makes it better.  Pt reports a second opinion about her back from Dr. Darrick Penna who did not change the treatment.  Pt reports her pain is radicular in nature and radiates down the back of both legs to the knees.    The history is provided by the patient and medical records. No language interpreter was used.    Past Medical History:  Diagnosis Date  . Asthma    Hx intubation R/T asthma  . Blurred vision   . Depression   . Migraine   . Obesity   . Ovarian cyst   . Pseudotumor cerebri   . Vertigo     Patient Active Problem List   Diagnosis Date Noted  . Chronic migraine 10/08/2017  . Pseudotumor cerebri 10/08/2017    Past Surgical History:  Procedure Laterality Date  . ABDOMINAL HYSTERECTOMY    . carpel  tunnel release    . CESAREAN SECTION    . peritoneal cyst       OB History   None      Home Medications    Prior to Admission medications     Medication Sig Start Date End Date Taking? Authorizing Provider  albuterol (PROVENTIL HFA;VENTOLIN HFA) 108 (90 Base) MCG/ACT inhaler Inhale 2 puffs into the lungs every 6 (six) hours as needed for wheezing or shortness of breath. 02/02/18  Yes Yu, Amy V, PA-C  ibuprofen (ADVIL,MOTRIN) 800 MG tablet Take 1 tablet (800 mg total) by mouth every 8 (eight) hours as needed for fever, headache or moderate pain. 01/20/18  Yes Pollina, Canary Brim, MD  oxyCODONE-acetaminophen (PERCOCET) 5-325 MG tablet Take 1 tablet by mouth every 6 (six) hours as needed for severe pain. 05/11/18  Yes Molpus, John, MD  rizatriptan (MAXALT-MLT) 5 MG disintegrating tablet Take 1 tablet (5 mg total) as needed by mouth. May repeat in 2 hours if needed Patient taking differently: Take 5 mg as needed by mouth for migraine. May repeat in 2 hours if needed 10/08/17  Yes Levert Feinstein, MD  topiramate (TOPAMAX) 100 MG tablet Take 1 tablet (100 mg total) 2 (two) times daily by mouth. Titrating up to 100mg , BID. Patient taking differently: Take 100 mg by mouth 2 (two) times daily.  10/08/17  Yes Levert Feinstein, MD  albuterol (PROVENTIL) (2.5 MG/3ML) 0.083% nebulizer solution Take 3 mLs (2.5 mg total) by nebulization every 6 (six) hours as needed for wheezing or shortness of breath. Patient not taking: Reported  on 05/20/2018 02/02/18   Belinda Fisher, PA-C  fluticasone (FLONASE) 50 MCG/ACT nasal spray Place 1 spray daily into both nostrils. Patient not taking: Reported on 05/20/2018 10/14/17   Caccavale, Sophia, PA-C  ipratropium-albuterol (DUONEB) 0.5-2.5 (3) MG/3ML SOLN Take 3 mLs by nebulization every 4 (four) hours as needed. Patient not taking: Reported on 05/20/2018 04/08/18   Georgetta Haber, NP  meclizine (ANTIVERT) 25 MG tablet Take 1 tablet (25 mg total) by mouth 3 (three) times daily as needed for dizziness. Patient not taking: Reported on 07/26/2018 09/26/17   Azalia Bilis, MD  methocarbamol (ROBAXIN) 500 MG tablet Take 1 tablet (500 mg  total) by mouth 2 (two) times daily. 07/26/18   Gerod Caligiuri, Dahlia Client, PA-C  montelukast (SINGULAIR) 10 MG tablet Take 1 tablet (10 mg total) by mouth at bedtime. Patient not taking: Reported on 07/26/2018 04/08/18   Linus Mako B, NP  naproxen (NAPROSYN) 375 MG tablet Take 1 tablet twice daily as needed for pain. Patient not taking: Reported on 07/26/2018 05/20/18   Pricilla Loveless, MD  Rivaroxaban 15 & 20 MG TBPK Use as directed 15 mg in the mouth or throat 2 (two) times daily. Take as directed on package: Start with one 15mg  tablet by mouth twice a day with food. On Day 22, switch to one 20mg  tablet once a day with food. Patient not taking: Reported on 05/20/2018 03/25/18   Elvina Sidle, MD    Family History Family History  Problem Relation Age of Onset  . Hypertension Other   . Diabetes Other   . Cancer Other   . CAD Other   . Leukemia Other   . Graves' disease Mother   . Other Mother        low blood sugar  . Hypertension Father   . Diabetes Maternal Grandmother   . Colon polyps Maternal Grandmother   . Glaucoma Maternal Grandfather   . Cataracts Maternal Grandfather   . Prostate cancer Maternal Grandfather   . Diabetes Paternal Grandmother   . Cataracts Paternal Grandfather   . Prostate cancer Paternal Grandfather     Social History Social History   Tobacco Use  . Smoking status: Never Smoker  . Smokeless tobacco: Never Used  Substance Use Topics  . Alcohol use: No  . Drug use: No     Allergies   Serevent [salmeterol]   Review of Systems Review of Systems  Constitutional: Negative for fatigue and fever.  Respiratory: Negative for chest tightness and shortness of breath.   Cardiovascular: Negative for chest pain.  Gastrointestinal: Negative for abdominal pain, diarrhea, nausea and vomiting.  Genitourinary: Negative for dysuria, frequency, hematuria and urgency.  Musculoskeletal: Positive for back pain and gait problem ( 2/2 pain ). Negative for joint  swelling, neck pain and neck stiffness.  Skin: Negative for rash.  Neurological: Negative for weakness, light-headedness, numbness and headaches.  All other systems reviewed and are negative.    Physical Exam Updated Vital Signs BP (!) 132/100 (BP Location: Right Arm)   Pulse 97   Temp 97.9 F (36.6 C) (Oral)   Resp 18   SpO2 100%   Physical Exam  Constitutional: She appears well-developed and well-nourished. No distress.  HENT:  Head: Normocephalic and atraumatic.  Eyes: Conjunctivae are normal.  Neck: Normal range of motion.  Full ROM without pain  Cardiovascular: Normal rate, regular rhythm and intact distal pulses.  Pulses:      Posterior tibial pulses are 2+ on the right side, and 2+ on  the left side.  Pulmonary/Chest: Effort normal.  Abdominal: She exhibits no distension. There is no CVA tenderness.  Musculoskeletal:  No midline or paraspinal tenderness to the T-spine  Bilateral tenderness to palpation of the paraspinous muscles of the L-spine, no step-off or deformity, no bruising FROM of bilateral hips, knees and ankles  Neurological: She is alert.  Speech is clear and goal oriented, follows commands Normal 5/5 strength in lower extremities bilaterally including dorsiflexion and plantar flexion Sensation normal to light touch Moves extremities without ataxia, coordination intact Antalgic gait; no foot drop or ataxia Normal balance  Skin: Skin is warm and dry. No rash noted. She is not diaphoretic. No erythema.  Psychiatric: She has a normal mood and affect. She is agitated.  Nursing note and vitals reviewed.    ED Treatments / Results   Procedures Procedures (including critical care time)  Medications Ordered in ED Medications - No data to display   Initial Impression / Assessment and Plan / ED Course  I have reviewed the triage vital signs and the nursing notes.  Pertinent labs & imaging results that were available during my care of the patient were  reviewed by me and considered in my medical decision making (see chart for details).     Patient with back pain ongoing for several months.  No neurological deficits and normal neuro exam.  Patient can walk without assistance and with steady gait but states is painful.  No loss of bowel or bladder control.  No concern for cauda equina.  No fever, night sweats, weight loss, h/o cancer, IVDU.  I discussed adding antiinflammatories and muscle relaxer to her mediation regimen.  Pt at this point began changing her story stating that she has taken "all the antiinflammatories on the market."  She oscillates between telling me she is currently taking prednisone and has taken "many courses" without relief.  She is unwilling to clarify for me what she is currently taking for her back pain.  Pt reports she drove herself and children to the ED. (Pt is alone in the room with 2 small children.)   I have offered pain control here with Toradol, but discussed that I cannot give narcotics if she is driving.  Pt refuses all medication in the ED.  Pt subsequently demands an MRI of her back stating that she is not leaving until she has the answers to why her back hurts.  I have explained that there is no clinical evidence that an emergent MRI is needed, but that she should follow-up with her orthopedist for this discussion.  Pt becomes very angry at this point, yelling and demanding her paperwork.  She is unwilling to discuss the situation further and continues to refuse any treatment here in the ED.       Final Clinical Impressions(s) / ED Diagnoses   Final diagnoses:  Midline low back pain with bilateral sciatica, unspecified chronicity    ED Discharge Orders         Ordered    methocarbamol (ROBAXIN) 500 MG tablet  2 times daily     07/26/18 0436           Sparkle Aube, Dahlia ClientHannah, PA-C 07/26/18 0457    Zadie RhineWickline, Donald, MD 07/26/18 [phone]

## 2018-07-26 NOTE — ED Notes (Signed)
Patient transported to MRI 

## 2018-07-26 NOTE — Discharge Instructions (Addendum)
Due to severity of symptoms and urinary/bowel incontinence associated with back pain I am recommending further evaluation and management in the ED Patient aware and in agreement with this plan

## 2018-07-26 NOTE — ED Triage Notes (Signed)
Pt c/o lower back pain that radiates down both legs. Pt has seen an orthopedic, started on prednisone and meloxicam with no change in pain. Pt reports loss of bladder x 1.

## 2018-07-26 NOTE — ED Triage Notes (Addendum)
Reports fracturing toe a few months ago; shortly after started with low back pain with progressive worsening.  C/O back spasms.  [facility] placed on steroid pack and meloxicam; reporting no relief.

## 2018-07-26 NOTE — ED Notes (Signed)
Two unsuccessful IV attempts.

## 2018-11-03 ENCOUNTER — Telehealth: Payer: Self-pay | Admitting: Neurology

## 2018-11-03 NOTE — Telephone Encounter (Addendum)
Spoke with Dr. Terrace ArabiaYan- ok for pt to see NP. I called office to [facility]. They placed me on hold to see if this was okay. Spoke with Delorise ShinerGrace. This was ok. Advised pt needs to bring updated insurance cards, copay and updated med list with her. Scheduled appt. She verbalized understanding and will let pt know.

## 2018-11-03 NOTE — Telephone Encounter (Signed)
Dr. Dyane DustmanHarrill office requesting a call to discuss getting the pt in asap to check for spinal build up and to rule out myasthenia gravis. Please advised at [phone]

## 2018-11-04 ENCOUNTER — Telehealth: Payer: Self-pay | Admitting: Neurology

## 2018-11-04 ENCOUNTER — Encounter: Payer: Self-pay | Admitting: Adult Health

## 2018-11-04 ENCOUNTER — Telehealth: Payer: Self-pay | Admitting: *Deleted

## 2018-11-04 ENCOUNTER — Ambulatory Visit (INDEPENDENT_AMBULATORY_CARE_PROVIDER_SITE_OTHER): Payer: PRIVATE HEALTH INSURANCE | Admitting: Neurology

## 2018-11-04 VITALS — BP 111/79 | HR 78 | Ht 65.0 in | Wt 302.0 lb

## 2018-11-04 DIAGNOSIS — R202 Paresthesia of skin: Secondary | ICD-10-CM | POA: Diagnosis not present

## 2018-11-04 DIAGNOSIS — IMO0002 Reserved for concepts with insufficient information to code with codable children: Secondary | ICD-10-CM

## 2018-11-04 DIAGNOSIS — G43709 Chronic migraine without aura, not intractable, without status migrainosus: Secondary | ICD-10-CM | POA: Diagnosis not present

## 2018-11-04 MED ORDER — RIZATRIPTAN BENZOATE 10 MG PO TBDP: 10.0000 mg | ORAL_TABLET | ORAL | 11 refills | Status: DC | PRN

## 2018-11-04 NOTE — Progress Notes (Signed)
PATIENT: Laurie Fields DOB: 06/27/1986  Chief Complaint  Patient presents with  . Migraine    Pseudotumor- "left side of face appears to be drooping, lazy or hooded eye- eye dr said I have myasthenia Gravis; loss of muscle functions such as walking and talking, unable to smile at times; loss of bladder /stool control at times"  . Follow-up     HISTORICAL  Laurie Fields is 32 years old female, seen in refer by her ophthalmologist Dr. Augustine RadarGivre, Maxine GlennSyndee reevaluation of pseudotumor cerebri, initial evaluation was on October 08 2017. Primary care physician is Dr. Mammie LorenzoBauman Ted Albert.   I reviewed and summarized the referring note from [facility], most recent visit was from Dr. Dustin FolksMichael Bowman on October 03 2017, she complained of headaches, reported a history of pseudotumor cerebri, she was given prescription of Topamax, titrating to 100 mg twice a day. Also was given Zofran, and NSAIDs as needed for abortive treatment,  Patient reported a long history of pseudotumor cerebri, she presented with constant headaches at age 32, spinal tap reported had open pressure of more than 50 cm water, the spinal tap did help her headaches, she had recurrent severe headache around age 32, also had lost her vision for few weeks per patient, everything was hazy, sensitive to light, she had another lumbar puncture, which also demonstrated elevated open pressure,  She was [facility] since 2014 for persistent headache, blurry vision, double vision, vertigo, per patient, there was elevated open pressure on lumbar puncture, she has been followed by ophthalmologist, was treated with Topamax along with intermittent lumbar puncture, which always helped her headache,  She noticed if she loses weight, her headache actually getting worse, she tried to gain some weight to have her headache under better control, in recent few months, she complains of constant almost daily headaches,  starting at right neck, radiating to right retro-orbital area severe pounding headache with associated light noise sensitivity, nauseous,  September 14 2017, when she get up quickly, she also had transient passing out episode, there was reported hypoglycemia   Topamax was helping her headache, recently she also complains of increased daily headaches, with associated blurry vision, dizziness  CT head without contrast in November 2018 was normal.  Laboratory evaluation showed normal BMP, CBC showed hemoglobin of 12,  UPDATE Nov 04 2018: She lost follow up since Nov 2018, she is busy with school, work and family. She complained of frequent headache, previously had partial response to IV infusion nerve block.  She suffered left toe fracture, wearing left boot since April 2019,  She complains of worsening daily headache, blurry vision, left facial droopy, she has spells of difficuy talking, walking, she has to be pushed on wheelchair, with bowel and bladder incontinence, she presented to ED multiplel times in August 2019,   She was seen by ophthalmologist on Dec 9th 2019, per patient, raised the concern for myasthenia gravis, I do not see oculomotor, or bulbar muscle weakness.  REVIEW OF SYSTEMS: Full 14 system review of systems performed and notable only for fatigue, palpitation, ringing ears, spinning sensation, blurred vision, double vision, memory loss, confusion, headaches, weakness, dizziness, passing out, anxiety, insomnia, restless leg,  All rest review of the system were negative   ALLERGIES: Allergies  Allergen Reactions  . Serevent [Salmeterol] Hives, Nausea And Vomiting and Other (See Comments)    Hallucinations Pt states tolerates Atrovent well    HOME MEDICATIONS: Current Outpatient Medications  Medication Sig Dispense Refill  .  cyclobenzaprine (FLEXERIL) 10 MG tablet Take 0.5-1 tablets (5-10 mg total) by mouth 2 (two) times daily as needed for muscle spasms. 20 tablet 0  .  MELOXICAM PO Take by mouth.    . naproxen (NAPROSYN) 375 MG tablet Take 1 tablet twice daily as needed for pain. 15 tablet 0  . oxyCODONE-acetaminophen (PERCOCET) 5-325 MG tablet Take 1-2 tablets by mouth every 6 (six) hours as needed for severe pain. 10 tablet 0  . predniSONE (STERAPRED UNI-PAK 21 TAB) 5 MG (21) TBPK tablet Take 5 mg by mouth daily.    . rizatriptan (MAXALT-MLT) 5 MG disintegrating tablet Take 1 tablet (5 mg total) as needed by mouth. May repeat in 2 hours if needed (Patient taking differently: Take 5 mg as needed by mouth for migraine. May repeat in 2 hours if needed) 15 tablet 12  . topiramate (TOPAMAX) 100 MG tablet Take 1 tablet (100 mg total) 2 (two) times daily by mouth. Titrating up to 100mg , BID. (Patient taking differently: Take 100 mg by mouth 2 (two) times daily. ) 60 tablet 11   No current facility-administered medications for this visit.     PAST MEDICAL HISTORY: Past Medical History:  Diagnosis Date  . Asthma    Hx intubation R/T asthma  . Blurred vision   . Depression   . Migraine   . Obesity   . Ovarian cyst   . Pseudotumor cerebri   . Vertigo     PAST SURGICAL HISTORY: Past Surgical History:  Procedure Laterality Date  . ABDOMINAL HYSTERECTOMY    . carpel  tunnel release    . CESAREAN SECTION    . peritoneal cyst      FAMILY HISTORY: Family History  Problem Relation Age of Onset  . Hypertension Other   . Diabetes Other   . Cancer Other   . CAD Other   . Leukemia Other   . Graves' disease Mother   . Other Mother        low blood sugar  . Hypertension Father   . Diabetes Maternal Grandmother   . Colon polyps Maternal Grandmother   . Glaucoma Maternal Grandfather   . Cataracts Maternal Grandfather   . Prostate cancer Maternal Grandfather   . Diabetes Paternal Grandmother   . Cataracts Paternal Grandfather   . Prostate cancer Paternal Grandfather     SOCIAL HISTORY:  Social History   Socioeconomic History  . Marital status:  Single    Spouse name: Not on file  . Number of children: 4  . Years of education: college  . Highest education level: Not on file  Occupational History  . Occupation: Merchandiser, retail  . Financial resource strain: Not on file  . Food insecurity:    Worry: Not on file    Inability: Not on file  . Transportation needs:    Medical: Not on file    Non-medical: Not on file  Tobacco Use  . Smoking status: Never Smoker  . Smokeless tobacco: Never Used  Substance and Sexual Activity  . Alcohol use: No  . Drug use: No  . Sexual activity: Not on file  Lifestyle  . Physical activity:    Days per week: Not on file    Minutes per session: Not on file  . Stress: Not on file  Relationships  . Social connections:    Talks on phone: Not on file    Gets together: Not on file    Attends religious service: Not on file  Active member of club or organization: Not on file    Attends meetings of clubs or organizations: Not on file    Relationship status: Not on file  . Intimate partner violence:    Fear of current or ex partner: Not on file    Emotionally abused: Not on file    Physically abused: Not on file    Forced sexual activity: Not on file  Other Topics Concern  . Not on file  Social History Narrative   Lives at home with her children. She has four living sons. She had one daughter that passed away at age 9 with Aplastic anemia.   Right-handed.   2-3 cups of caffeine weekly.     PHYSICAL EXAM   Vitals:   11/04/18 0747  BP: 111/79  Pulse: 78  Weight: (!) 302 lb (137 kg)  Height: 5\' 5"  (1.651 m)    Not recorded      Body mass index is 50.26 kg/m.  PHYSICAL EXAMNIATION:  Gen: NAD, conversant, well nourised, obese, well groomed                     Cardiovascular: Regular rate rhythm, no peripheral edema, warm, nontender. Eyes: Conjunctivae clear without exudates or hemorrhage Neck: Supple, no carotid bruits. Pulmonary: Clear to auscultation bilaterally    NEUROLOGICAL EXAM:  MENTAL STATUS: Speech:    Speech is normal; fluent and spontaneous with normal comprehension.  Cognition:     Orientation to time, place and person     Normal recent and remote memory     Normal Attention span and concentration     Normal Language, naming, repeating,spontaneous speech     Fund of knowledge   CRANIAL NERVES: CN II: Visual fields are full to confrontation. Fundoscopic exam is normal with sharp discs and no vascular changes. Pupils are round equal and briskly reactive to light. CN III, IV, VI: extraocular movement are normal. No ptosis. CN V: Facial sensation is intact to pinprick in all 3 divisions bilaterally. Corneal responses are intact.  CN VII: Face is symmetric with normal eye closure and smile. CN VIII: Hearing is normal to rubbing fingers CN IX, X: Palate elevates symmetrically. Phonation is normal. CN XI: Head turning and shoulder shrug are intact CN XII: Tongue is midline with normal movements and no atrophy.  MOTOR: There is no pronator drift of out-stretched arms. Muscle bulk and tone are normal. Muscle strength is normal.  REFLEXES: Reflexes are 2+ and symmetric at the biceps, triceps, knees, and ankles. Plantar responses are flexor.  SENSORY: Intact to light touch, pinprick, positional sensation and vibratory sensation are intact in fingers and toes.  COORDINATION: Rapid alternating movements and fine finger movements are intact. There is no dysmetria on finger-to-nose and heel-knee-shin.    GAIT/STANCE: Antalgic due to left boot Romberg is absent.   DIAGNOSTIC DATA (LABS, IMAGING, TESTING) - I reviewed patient records, labs, notes, testing and imaging myself where available.   ASSESSMENT AND PLAN  Storm Dulski is a 32 y.o. female   Chronic daily headache with migraine features, Left facial numbness, subjective weakness  History of pseudotumor cerebri, I did not see papillary edema, I was not able to  appreciate venous pulsations either.  MRI brain  Keep Topamax to 100 mg twice a day as migraine prevention  Maxalt and Zofran as needed for abortive treatment   Lab evaluations, acetylcholine receptor antibody  Levert Feinstein, M.D. Ph.D.  [facility] [address]  [place], [place] [address] Ph: [phone] Fax: [phone]  CC: Glenice Laine, MD

## 2018-11-04 NOTE — Telephone Encounter (Signed)
I called number provided by pharmacy [phone]. Spoke with Fayrene FearingJames who transferred me to [place]. Call dropped. I called back. Found out this was for her workers compensation. I faxed pharmacy a message to run through pt regular prescription coverage. Should not be run under her workers compensation. [facility] at [phone]. Received fax confirmation.

## 2018-11-04 NOTE — Telephone Encounter (Signed)
Medicare/[employer] health order sent to GI. No auth they will reach out to the pt to schedule.

## 2018-11-04 NOTE — Telephone Encounter (Signed)
Initiated PA Rizatriptan 10mg  ODT on covermymeds. Key: [id number]: [id number]. Received message: "Your PA request cannot be processed for the member plan submitted. For further inquiries please contact the number on the back of the member prescription card. (Message [phone])".

## 2018-11-08 ENCOUNTER — Emergency Department (HOSPITAL_COMMUNITY): Payer: 59

## 2018-11-08 ENCOUNTER — Emergency Department (HOSPITAL_COMMUNITY)
Admission: EM | Admit: 2018-11-08 | Discharge: 2018-11-08 | Disposition: A | Discharge status: Home / Self Care | Payer: 59 | Attending: Emergency Medicine | Admitting: Emergency Medicine

## 2018-11-08 ENCOUNTER — Other Ambulatory Visit: Payer: Self-pay

## 2018-11-08 ENCOUNTER — Encounter (HOSPITAL_COMMUNITY): Payer: Self-pay

## 2018-11-08 DIAGNOSIS — R531 Weakness: Secondary | ICD-10-CM | POA: Diagnosis not present

## 2018-11-08 DIAGNOSIS — R202 Paresthesia of skin: Secondary | ICD-10-CM | POA: Diagnosis not present

## 2018-11-08 DIAGNOSIS — G932 Benign intracranial hypertension: Secondary | ICD-10-CM | POA: Diagnosis not present

## 2018-11-08 LAB — BASIC METABOLIC PANEL
Anion gap: 7 (ref 5–15)
BUN: 12 mg/dL (ref 6–20)
CALCIUM: 8.5 mg/dL — AB (ref 8.9–10.3)
CHLORIDE: 107 mmol/L (ref 98–111)
CO2: 26 mmol/L (ref 22–32)
CREATININE: 0.86 mg/dL (ref 0.44–1.00)
GFR calc Af Amer: >60 mL/min (ref >60)
GFR calc non Af Amer: >60 mL/min (ref >60)
Glucose, Bld: 95 mg/dL (ref 70–99)
Potassium: 3.9 mmol/L (ref 3.5–5.1)
SODIUM: 140 mmol/L (ref 135–145)

## 2018-11-08 LAB — TSH: TSH: 4.64 u[IU]/mL — ABNORMAL HIGH (ref 0.350–4.500)

## 2018-11-08 LAB — URINALYSIS, ROUTINE W REFLEX MICROSCOPIC
BILIRUBIN URINE: NEGATIVE
GLUCOSE, UA: NEGATIVE mg/dL
Hgb urine dipstick: NEGATIVE
KETONES UR: 5 mg/dL — AB
Leukocytes, UA: NEGATIVE
Nitrite: NEGATIVE
PH: 5 (ref 5.0–8.0)
Protein, ur: NEGATIVE mg/dL
Specific Gravity, Urine: 1.027 (ref 1.005–1.030)

## 2018-11-08 LAB — CBC
HEMATOCRIT: 40.4 % (ref 36.0–46.0)
Hemoglobin: 12.5 g/dL (ref 12.0–15.0)
MCH: 27.5 pg (ref 26.0–34.0)
MCHC: 30.9 g/dL (ref 30.0–36.0)
MCV: 89 fL (ref 80.0–100.0)
Platelets: 358 10*3/uL (ref 150–400)
RBC: 4.54 MIL/uL (ref 3.87–5.11)
RDW: 13.8 % (ref 11.5–15.5)
WBC: 9.4 10*3/uL (ref 4.0–10.5)
nRBC: 0 % (ref 0.0–0.2)

## 2018-11-08 LAB — CBG MONITORING, ED: Glucose-Capillary: 79 mg/dL (ref 70–99)

## 2018-11-08 LAB — I-STAT BETA HCG BLOOD, ED (MC, WL, AP ONLY): I-stat hCG, quantitative: <5 m[IU]/mL (ref <5)

## 2018-11-08 MED ORDER — GADOBUTROL 1 MMOL/ML IV SOLN
10.0000 mL | Freq: Once | INTRAVENOUS | Status: AC | PRN
  Administered 2018-11-08: 10 mL via INTRAVENOUS

## 2018-11-08 NOTE — ED Triage Notes (Signed)
Pt reports increased fatigue that started on Tuesday. She states that she has pseudotumor cerebri and she went to the eye doctor on Tuesday, who commented on the L side of her face looking different. She states that since Tuesday, she feels like her L side is becoming weaker. She is also reporting SOB, but states that it doesn't feel like asthma so she has not been using her inhaler. She states that she recently started going back to work and she is "fine in the morning, but tired by 1 or 2p." A&Ox4. Ambulatory.

## 2018-11-08 NOTE — ED Notes (Signed)
Patient stated she began to having trouble swallowing while in MRI but not breathing, RN elevated head of bed to 30 degrees. Patient stated that feels better and helps. RN will continue to monitor.

## 2018-11-08 NOTE — ED Notes (Signed)
Patient transported to MRI 

## 2018-11-08 NOTE — Discharge Instructions (Signed)
Recently in the ER for the left-sided weakness. MRI results do not show any evidence of multiple sclerosis, tumors or stroke. Please continue following up with your primary neurologist.  Primary neurologist will have access to the MRI results.

## 2018-11-08 NOTE — ED Provider Notes (Signed)
[facility]-EMERGENCY DEPT Provider Note   CSN: [id number] Arrival date & time: 11/08/18  0037     History   Chief Complaint Chief Complaint  Patient presents with  . Fatigue    HPI Laurie Fields is a 32 y.o. female.  HPI  32 year old female comes in with chief complaint of weakness.  Patient has history of migraines, pseudotumor cerebri. She reports that over the past 3 days she has started noticing left-sided weakness.  She feels like her left lower extremity is heavy and she has numbness in the upper extremity.  She is also noted drooping on the left side, and her ophthalmologist said that she had ptosis.  Furthermore she states that she is having double vision and increased blurry vision on the left side.  Patient has history of pseudotumor cerebri, however states that her ophthalmologist told her she did not have pseudotumor cerebri.  Patient has seen her outpatient neurologist who has ordered an MRI for December 18.  Patient symptoms were getting worse therefore she decided to come to the ER.  Past Medical History:  Diagnosis Date  . Asthma    Hx intubation R/T asthma  . Blurred vision   . Depression   . Migraine   . Obesity   . Ovarian cyst   . Pseudotumor cerebri   . Vertigo     Patient Active Problem List   Diagnosis Date Noted  . Paresthesia 11/04/2018  . Chronic migraine 10/08/2017  . Pseudotumor cerebri 10/08/2017    Past Surgical History:  Procedure Laterality Date  . ABDOMINAL HYSTERECTOMY    . carpel  tunnel release    . CESAREAN SECTION    . peritoneal cyst       OB History   No obstetric history on file.      Home Medications    Prior to Admission medications   Medication Sig Start Date End Date Taking? Authorizing Provider  albuterol (ACCUNEB) 0.63 MG/3ML nebulizer solution Take 1 ampule by nebulization every 6 (six) hours as needed for wheezing.   Yes [provider]  albuterol (PROVENTIL  HFA;VENTOLIN HFA) 108 (90 Base) MCG/ACT inhaler Inhale 2 puffs into the lungs every 6 (six) hours as needed for wheezing or shortness of breath.   Yes [provider]  cyclobenzaprine (FLEXERIL) 10 MG tablet Take 0.5-1 tablets (5-10 mg total) by mouth 2 (two) times daily as needed for muscle spasms. 07/26/18  Yes Harris, Abigail, PA-C  rizatriptan (MAXALT-MLT) 10 MG disintegrating tablet Take 1 tablet (10 mg total) by mouth as needed. May repeat in 2 hours if needed 11/04/18  Yes Levert Feinstein, MD  topiramate (TOPAMAX) 100 MG tablet Take 1 tablet (100 mg total) 2 (two) times daily by mouth. Titrating up to 100mg , BID. Patient taking differently: Take 100 mg by mouth 2 (two) times daily.  10/08/17  Yes Levert Feinstein, MD  naproxen (NAPROSYN) 375 MG tablet Take 1 tablet twice daily as needed for pain. Patient not taking: Reported on 11/08/2018 05/20/18   Pricilla Loveless, MD  oxyCODONE-acetaminophen (PERCOCET) 5-325 MG tablet Take 1-2 tablets by mouth every 6 (six) hours as needed for severe pain. Patient not taking: Reported on 11/08/2018 07/26/18   Arthor Captain, PA-C    Family History Family History  Problem Relation Age of Onset  . Hypertension Other   . Diabetes Other   . Cancer Other   . CAD Other   . Leukemia Other   . Graves' disease Mother   . Other  Mother        low blood sugar  . Hypertension Father   . Diabetes Maternal Grandmother   . Colon polyps Maternal Grandmother   . Glaucoma Maternal Grandfather   . Cataracts Maternal Grandfather   . Prostate cancer Maternal Grandfather   . Diabetes Paternal Grandmother   . Cataracts Paternal Grandfather   . Prostate cancer Paternal Grandfather     Social History Social History   Tobacco Use  . Smoking status: Never Smoker  . Smokeless tobacco: Never Used  Substance Use Topics  . Alcohol use: No  . Drug use: No     Allergies   Serevent [salmeterol]   Review of Systems Review of Systems  Constitutional: Positive for  activity change.  Respiratory: Negative for shortness of breath.   Cardiovascular: Negative for chest pain.  Neurological: Positive for weakness, numbness and headaches.  All other systems reviewed and are negative.    Physical Exam Updated Vital Signs BP 119/77 (BP Location: Left Arm)   Pulse 74   Temp (!) 97.5 F (36.4 C) (Oral)   Resp 16   SpO2 100%   Physical Exam Vitals signs and nursing note reviewed.  Constitutional:      Appearance: She is well-developed.  HENT:     Head: Normocephalic and atraumatic.  Eyes:     Extraocular Movements: Extraocular movements intact.     Pupils: Pupils are equal, round, and reactive to light.  Neck:     Musculoskeletal: Normal range of motion and neck supple.  Cardiovascular:     Rate and Rhythm: Normal rate.  Pulmonary:     Effort: Pulmonary effort is normal.  Abdominal:     General: Bowel sounds are normal.  Skin:    General: Skin is warm and dry.  Neurological:     Mental Status: She is alert and oriented to person, place, and time.     Comments: Subjective numbness over the left face, left upper extremity and left lower extremity.  Patient is noted to have 4+ out of 5 left lower extremity strength, however she does appear little weaker on the left side compared to the right.  Patient also has mild ptosis.  Visual acuity exam at the bedside revealed diplopia on the left eye.      ED Treatments / Results  Labs (all labs ordered are listed, but only abnormal results are displayed) Labs Reviewed  BASIC METABOLIC PANEL - Abnormal; Notable for the following components:      Result Value   Calcium 8.5 (*)    All other components within normal limits  URINALYSIS, ROUTINE W REFLEX MICROSCOPIC - Abnormal; Notable for the following components:   Ketones, ur 5 (*)    All other components within normal limits  TSH - Abnormal; Notable for the following components:   TSH 4.640 (*)    All other components within normal limits  CBC    CBG MONITORING, ED  I-STAT BETA HCG BLOOD, ED ([facility], [facility], [facility] ONLY)    EKG None  Radiology Ct Head Wo Contrast  Result Date: 11/08/2018 CLINICAL DATA:  LEFT-sided weakness.  Pseudotumor cerebri. EXAM: CT HEAD WITHOUT CONTRAST TECHNIQUE: Contiguous axial images were obtained from the base of the skull through the vertex without intravenous contrast. COMPARISON:  09/26/2017. FINDINGS: Brain: No evidence for acute infarction, hemorrhage, mass lesion, hydrocephalus, or extra-axial fluid. Normal for age cerebral volume. No white matter disease. Vascular: No hyperdense vessel or unexpected calcification. Skull: Normal. Negative for fracture or focal  lesion. Sinuses/Orbits: No acute finding. Other: None. IMPRESSION: Negative exam.  No change. Electronically Signed   By: Elsie Stain M.D.   On: 11/08/2018 06:30    Procedures Procedures (including critical care time)  Medications Ordered in ED Medications  gadobutrol (GADAVIST) 1 MMOL/ML injection 10 mL (10 mLs Intravenous Contrast Given 11/08/18 0745)     Initial Impression / Assessment and Plan / ED Course  I have reviewed the triage vital signs and the nursing notes.  Pertinent labs & imaging results that were available during my care of the patient were reviewed by me and considered in my medical decision making (see chart for details).     32 year old woman comes into the ER with chief complaint of worsening left-sided weakness. She has history of idiopathic intracranial hypertension and migraines.  It appears that she has seen outpatient physicians and there is an MRI ordered.  She has not had this type of symptoms with her idiopathic intracranial hypertension and neither her neurologist or the ophthalmologist noted apple edema on their evaluation.  I spoke with telemetry neurology.  Telemetry neurologist recommended that we get MRI with and without contrast in the ED.  If the MRI is negative then patient can continue with her outpatient  neuro follow-up.  This plan has been discussed with the patient who is in agreement. I considered myasthenia gravis in the differential, however patient does not gives a history of progressive weakness on the left side.  Final Clinical Impressions(s) / ED Diagnoses   Final diagnoses:  None    ED Discharge Orders    None       Derwood Kaplan, MD 11/08/18 [phone]

## 2018-11-08 NOTE — ED Notes (Signed)
Pt stated during visual acuity screening, her vision was blurred and it was very hard for her to read any of the lines.

## 2018-11-08 NOTE — ED Notes (Signed)
ED provider at bedside with patient.

## 2018-11-10 ENCOUNTER — Telehealth: Payer: Self-pay | Admitting: Neurology

## 2018-11-10 DIAGNOSIS — R569 Unspecified convulsions: Secondary | ICD-10-CM

## 2018-11-10 DIAGNOSIS — G932 Benign intracranial hypertension: Secondary | ICD-10-CM

## 2018-11-10 NOTE — Telephone Encounter (Signed)
Left message requesting patient to return my call.

## 2018-11-10 NOTE — Telephone Encounter (Signed)
Pt requesting a call to discuss recent ED visit stating she is having weakness throughout her left side and trouble walking. Stating the ED ruled out MS, pt unsure what steps to take next please advise.

## 2018-11-10 NOTE — Telephone Encounter (Signed)
Returned call to patient.  Says she presented to the ED for left sided weakness and difficulty walking.  Dr. Terrace ArabiaYan reviewed her ED visit.  Negative MRI.  She wanted me to further discuss symptoms with the patient.  Was she having a migraine at the time of the symptoms?  Is she still taking topiramate?  Has she ever tried her rizatriptan?  Dr. Terrace ArabiaYan may make medication changes depending on this information.  I was able to ask the patient if she had an active migraine during the time she had her left-sided weakness.  She denied having a head pain at that time.  I ask her about her medications.  Before she was able to answer, she abruptly stated she had to go because she was at work and needed to take a call.  She said she would call back later.  If she calls back, we are happy to further assist her.

## 2018-11-10 NOTE — Telephone Encounter (Signed)
Patient returned my call.  She is taking Topamax 100mg , BID.  She has not required any rizatriptan.  Says her migraines have been well controlled.  States this is the second event of left-sided weakness, unsteady gait, slurred speech.  She had a similar event that occurred in August 2019 but she additionally had a loss of bladder control.  She is also struggling with memory/cognitive fog and feels this is much worse since being on Topamax.  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  She would like guidance on next step to evaluate her weakness episodes.  She would also like to try an alternate medication that would not affect her cognitive skills.

## 2018-11-11 MED ORDER — TOPIRAMATE ER 200 MG PO CAP24: 200.0000 mg | ORAL_CAPSULE | Freq: Every day | ORAL | 11 refills | Status: DC

## 2018-11-11 NOTE — Telephone Encounter (Signed)
Dr. Terrace ArabiaYan provided the following verbal orders:  1) repeat EEG 2) change topiramate 100mg , one tablet BID to Trokendi XR 200mg , one tablet at bedtime in effort to reduce side effects  I left a message requesting the patient to call me back.

## 2018-11-11 NOTE — Addendum Note (Signed)
Addended by: Lindell SparKIRKMAN, MICHELLE C on: 11/11/2018 11:21 AM   Modules accepted: Orders

## 2018-11-11 NOTE — Telephone Encounter (Signed)
Spoke to patient - she is agreeable to transition to Trokendi and have repeat EEG.  New prescription sent to pharmacy.  She is aware of the $0 co-pay offer through TrokendiXR.  The orders have been placed for her EEG and she knows to expect a call for scheduling.

## 2018-11-12 ENCOUNTER — Other Ambulatory Visit: Payer: Self-pay

## 2018-11-14 ENCOUNTER — Telehealth: Payer: Self-pay | Admitting: *Deleted

## 2018-11-14 LAB — ACETYLCHOLINE RECEPTOR AB, ALL
ACETYLCHOL BLOCK AB: 6 % (ref 0–25)
ACHR BINDING AB, SERUM: 0.07 nmol/L (ref 0.00–0.24)

## 2018-11-14 NOTE — Telephone Encounter (Signed)
PA trokendi XR approved 11/24/17-11/25/18.

## 2018-12-13 ENCOUNTER — Encounter (HOSPITAL_COMMUNITY): Payer: Self-pay

## 2018-12-13 ENCOUNTER — Emergency Department (HOSPITAL_COMMUNITY)
Admission: EM | Admit: 2018-12-13 | Discharge: 2018-12-13 | Disposition: A | Discharge status: Home / Self Care | Payer: 59 | Attending: Emergency Medicine | Admitting: Emergency Medicine

## 2018-12-13 ENCOUNTER — Other Ambulatory Visit: Payer: Self-pay

## 2018-12-13 ENCOUNTER — Emergency Department (HOSPITAL_COMMUNITY): Payer: 59

## 2018-12-13 DIAGNOSIS — J45909 Unspecified asthma, uncomplicated: Secondary | ICD-10-CM | POA: Diagnosis not present

## 2018-12-13 DIAGNOSIS — R6889 Other general symptoms and signs: Secondary | ICD-10-CM

## 2018-12-13 DIAGNOSIS — B9789 Other viral agents as the cause of diseases classified elsewhere: Secondary | ICD-10-CM | POA: Insufficient documentation

## 2018-12-13 DIAGNOSIS — Z79899 Other long term (current) drug therapy: Secondary | ICD-10-CM | POA: Insufficient documentation

## 2018-12-13 DIAGNOSIS — R05 Cough: Secondary | ICD-10-CM | POA: Diagnosis present

## 2018-12-13 DIAGNOSIS — J069 Acute upper respiratory infection, unspecified: Secondary | ICD-10-CM | POA: Diagnosis not present

## 2018-12-13 LAB — GROUP A STREP BY PCR: Group A Strep by PCR: NOT DETECTED

## 2018-12-13 NOTE — ED Provider Notes (Signed)
[facility]-EMERGENCY DEPT Provider Note   CSN: [id number] Arrival date & time: 12/13/18  1131     History   Chief Complaint Chief Complaint  Patient presents with  . Flu Like Symptoms    HPI Laurie Fields is a 33 y.o. female with a PMHx of asthma, migraines, pseudotumor cerebri, obesity, and other conditions listed below, who presents to the ED with complaints of flulike symptoms for the last 4 to 5 days.  Symptoms include body aches, dry cough, fatigue, sore throat, chills, congestion, rhinorrhea, and left otalgia.  She also reports left-sided chest pain with coughing.  She is tried ibuprofen, Tylenol, and TheraFlu without relief.  Her symptoms seem to worsen at night.  She did not receive her flu shot this year, she has not had any sick contacts, non-smoker.  She denies any ear drainage, fevers, drooling, trismus, shortness of breath, abdominal pain, nausea, vomiting, diarrhea, constipation, dysuria, hematuria, numbness, tingling, focal weakness, rashes, leg swelling, or any other complaints at this time.  No recent travel/surgery/immobilization.  The history is provided by the patient and medical records. No language interpreter was used.    Past Medical History:  Diagnosis Date  . Asthma    Hx intubation R/T asthma  . Blurred vision   . Depression   . Migraine   . Obesity   . Ovarian cyst   . Pseudotumor cerebri   . Vertigo     Patient Active Problem List   Diagnosis Date Noted  . Paresthesia 11/04/2018  . Chronic migraine 10/08/2017  . Pseudotumor cerebri 10/08/2017    Past Surgical History:  Procedure Laterality Date  . ABDOMINAL HYSTERECTOMY    . carpel  tunnel release    . CESAREAN SECTION    . peritoneal cyst       OB History   No obstetric history on file.      Home Medications    Prior to Admission medications   Medication Sig Start Date End Date Taking? Authorizing Provider  albuterol (ACCUNEB) 0.63 MG/3ML nebulizer  solution Take 1 ampule by nebulization every 6 (six) hours as needed for wheezing.    [provider]  albuterol (PROVENTIL HFA;VENTOLIN HFA) 108 (90 Base) MCG/ACT inhaler Inhale 2 puffs into the lungs every 6 (six) hours as needed for wheezing or shortness of breath.    [provider]  cyclobenzaprine (FLEXERIL) 10 MG tablet Take 0.5-1 tablets (5-10 mg total) by mouth 2 (two) times daily as needed for muscle spasms. 07/26/18   Harris, Cammy Copa, PA-C  naproxen (NAPROSYN) 375 MG tablet Take 1 tablet twice daily as needed for pain. Patient not taking: Reported on 11/08/2018 05/20/18   Pricilla Loveless, MD  oxyCODONE-acetaminophen (PERCOCET) 5-325 MG tablet Take 1-2 tablets by mouth every 6 (six) hours as needed for severe pain. Patient not taking: Reported on 11/08/2018 07/26/18   Arthor Captain, PA-C  rizatriptan (MAXALT-MLT) 10 MG disintegrating tablet Take 1 tablet (10 mg total) by mouth as needed. May repeat in 2 hours if needed 11/04/18   Levert Feinstein, MD  Topiramate ER (TROKENDI XR) 200 MG CP24 Take 200 mg by mouth at bedtime. 11/11/18   Levert Feinstein, MD    Family History Family History  Problem Relation Age of Onset  . Hypertension Other   . Diabetes Other   . Cancer Other   . CAD Other   . Leukemia Other   . Graves' disease Mother   . Other Mother  low blood sugar  . Hypertension Father   . Diabetes Maternal Grandmother   . Colon polyps Maternal Grandmother   . Glaucoma Maternal Grandfather   . Cataracts Maternal Grandfather   . Prostate cancer Maternal Grandfather   . Diabetes Paternal Grandmother   . Cataracts Paternal Grandfather   . Prostate cancer Paternal Grandfather     Social History Social History   Tobacco Use  . Smoking status: Never Smoker  . Smokeless tobacco: Never Used  Substance Use Topics  . Alcohol use: No  . Drug use: No     Allergies   Serevent [salmeterol]   Review of Systems Review of Systems  Constitutional: Positive for  chills and fatigue. Negative for fever.  HENT: Positive for congestion, ear pain, rhinorrhea and sore throat. Negative for drooling, ear discharge and trouble swallowing.   Respiratory: Positive for cough. Negative for shortness of breath.   Cardiovascular: Positive for chest pain. Negative for leg swelling.  Gastrointestinal: Negative for abdominal pain, constipation, diarrhea, nausea and vomiting.  Genitourinary: Negative for dysuria and hematuria.  Musculoskeletal: Positive for myalgias. Negative for arthralgias.  Skin: Negative for color change and rash.  Allergic/Immunologic: Negative for immunocompromised state.  Neurological: Negative for weakness and numbness.  Psychiatric/Behavioral: Negative for confusion.   All other systems reviewed and are negative for acute change except as noted in the HPI.    Physical Exam Updated Vital Signs BP (!) 156/99   Pulse 65   Temp 98.6 F (37 C) (Oral)   Resp 16   SpO2 100%   Physical Exam Vitals signs and nursing note reviewed.  Constitutional:      General: She is not in acute distress.    Appearance: Normal appearance. She is well-developed. She is not toxic-appearing.     Comments: Afebrile, nontoxic, NAD  HENT:     Head: Normocephalic and atraumatic.     Right Ear: Hearing, ear canal and external ear normal. A middle ear effusion (serous) is present. Tympanic membrane is not injected, erythematous or bulging.     Left Ear: Hearing, ear canal and external ear normal. A middle ear effusion (serous) is present. Tympanic membrane is not injected, erythematous or bulging.     Ears:     Comments: Ears with serous effusions behind both TMs, but otherwise clear bilaterally, no injection/erythema to TMs, no bulging TMs. Canals clear.     Nose: Congestion present.     Mouth/Throat:     Mouth: Mucous membranes are moist.     Pharynx: Uvula midline. Posterior oropharyngeal erythema present. No pharyngeal swelling, oropharyngeal exudate or  uvula swelling.     Tonsils: No tonsillar exudate or tonsillar abscesses. Swelling: 1+ on the right. 1+ on the left.     Comments: Oropharynx injected, without uvular swelling or deviation, no trismus or drooling, 1+ b/l tonsillar hypertrophy without frank swelling, no exudates.  No PTA.  Eyes:     General:        Right eye: No discharge.        Left eye: No discharge.     Conjunctiva/sclera: Conjunctivae normal.  Neck:     Musculoskeletal: Normal range of motion and neck supple.  Cardiovascular:     Rate and Rhythm: Normal rate and regular rhythm.     Pulses: Normal pulses.     Heart sounds: Normal heart sounds, S1 normal and S2 normal. No murmur. No friction rub. No gallop.      Comments: RRR, nl s1/s2, no m/r/g, distal  pulses intact, no pedal edema  Pulmonary:     Effort: Pulmonary effort is normal. No respiratory distress.     Breath sounds: Normal breath sounds. No decreased breath sounds, wheezing, rhonchi or rales.     Comments: CTAB in all lung fields, no w/r/r, no hypoxia or increased WOB, speaking in full sentences, SpO2 100% on RA  Chest:     Chest wall: Tenderness present. No deformity or crepitus.       Comments: Chest wall with mild TTP to L anterior chest wall, without crepitus, deformities, or retractions  Abdominal:     General: Bowel sounds are normal. There is no distension.     Palpations: Abdomen is soft. Abdomen is not rigid.     Tenderness: There is no abdominal tenderness. There is no right CVA tenderness, left CVA tenderness, guarding or rebound. Negative signs include Murphy's sign and McBurney's sign.  Musculoskeletal: Normal range of motion.  Skin:    General: Skin is warm and dry.     Findings: No rash.  Neurological:     Mental Status: She is alert and oriented to person, place, and time.     Sensory: Sensation is intact. No sensory deficit.     Motor: Motor function is intact.  Psychiatric:        Mood and Affect: Mood and affect normal.         Behavior: Behavior normal.      ED Treatments / Results  Labs (all labs ordered are listed, but only abnormal results are displayed) Labs Reviewed  GROUP A STREP BY PCR    EKG None  Radiology Dg Chest 2 View  Result Date: 12/13/2018 CLINICAL DATA:  Body aches, sore throat, fatigue, cough EXAM: CHEST - 2 VIEW COMPARISON:  02/02/2018 FINDINGS: The heart size and mediastinal contours are within normal limits. Both lungs are clear. The visualized skeletal structures are unremarkable. IMPRESSION: No active cardiopulmonary disease. Electronically Signed   By: Judie Petit.  Shick M.D.   On: 12/13/2018 13:39    Procedures Procedures (including critical care time)  Medications Ordered in ED Medications - No data to display   Initial Impression / Assessment and Plan / ED Course  I have reviewed the triage vital signs and the nursing notes.  Pertinent labs & imaging results that were available during my care of the patient were reviewed by me and considered in my medical decision making (see chart for details).     33 y.o. female here with flu like symptoms x4-5 days. On exam, clear lungs, throat injected with 1+ tonsillar hypertrophy, ears with serous effusions but otherwise clear, nose congested, chest wall with mild L sided TTP, afebrile and nontoxic appearing. Pt concerned about PNA, will get CXR and strep testing, outside of window where tamiflu would help so doubt need for flu testing, doubt need for other labs/imaging at this time, will reassess shortly.   3:38 PM CXR neg. Strep test neg. Likely viral/flu illness, too late for tamiflu at this time. Advised OTC remedies for symptomatic relief, f/up with PCP in 1wk. I explained the diagnosis and have given explicit precautions to return to the ER including for any other new or worsening symptoms. The patient understands and accepts the medical plan as it's been dictated and I have answered their questions. Discharge instructions concerning home  care and prescriptions have been given. The patient is STABLE and is discharged to home in good condition.    Final Clinical Impressions(s) / ED Diagnoses  Final diagnoses:  Flu-like symptoms  Viral upper respiratory tract infection with cough    ED Discharge Orders    [address]None       Jocie Meroney, ArgyleMercedes, New JerseyPA-C 12/13/18 1538    Mancel BaleWentz, Elliott, MD 12/14/18 1526

## 2018-12-13 NOTE — ED Triage Notes (Signed)
Pt reports body aches, sore throat, and fatigue x4 days. Pt has been taking OVC medications without any relief and states she feels like she is getting worse. Denies any fever.

## 2018-12-13 NOTE — Discharge Instructions (Signed)
Continue to stay well-hydrated. Gargle warm salt water and spit it out and use chloraseptic spray as needed for sore throat. Continue to alternate between Tylenol and Ibuprofen for pain or fever. Use Mucinex/Robitussin/etc for cough suppression/expectoration of mucus. Use over the counter flonase and the netipot to help with nasal congestion. May consider over-the-counter Benadryl or other antihistamine like Claritin/Zyrtec/etc to decrease secretions and for help with your symptoms. Follow up with your primary care doctor in 5-7 days for recheck of ongoing symptoms. Return to emergency department for emergent changing or worsening of symptoms. °

## 2019-01-26 ENCOUNTER — Encounter: Payer: Self-pay | Admitting: Neurology

## 2019-02-11 ENCOUNTER — Emergency Department (HOSPITAL_COMMUNITY)
Admission: EM | Admit: 2019-02-11 | Discharge: 2019-02-12 | Disposition: A | Discharge status: Home / Self Care | Payer: Self-pay | Attending: Emergency Medicine | Admitting: Emergency Medicine

## 2019-02-11 ENCOUNTER — Encounter (HOSPITAL_COMMUNITY): Payer: Self-pay

## 2019-02-11 DIAGNOSIS — J069 Acute upper respiratory infection, unspecified: Secondary | ICD-10-CM | POA: Insufficient documentation

## 2019-02-11 DIAGNOSIS — Z79899 Other long term (current) drug therapy: Secondary | ICD-10-CM | POA: Insufficient documentation

## 2019-02-11 DIAGNOSIS — R509 Fever, unspecified: Secondary | ICD-10-CM | POA: Insufficient documentation

## 2019-02-11 DIAGNOSIS — R0602 Shortness of breath: Secondary | ICD-10-CM | POA: Insufficient documentation

## 2019-02-11 NOTE — ED Triage Notes (Signed)
Pt states that she traveled from [place] on Monday and started to have a fever yesterday and shortness of breath Pt didn't go out of the country

## 2019-02-12 ENCOUNTER — Emergency Department (HOSPITAL_COMMUNITY): Payer: Self-pay

## 2019-02-12 LAB — RESPIRATORY PANEL BY PCR
Adenovirus: NOT DETECTED
Bordetella pertussis: NOT DETECTED
CHLAMYDOPHILA PNEUMONIAE-RVPPCR: NOT DETECTED
CORONAVIRUS HKU1-RVPPCR: NOT DETECTED
Coronavirus 229E: NOT DETECTED
Coronavirus NL63: NOT DETECTED
Coronavirus OC43: NOT DETECTED
Influenza A: NOT DETECTED
Influenza B: NOT DETECTED
Metapneumovirus: NOT DETECTED
Mycoplasma pneumoniae: NOT DETECTED
PARAINFLUENZA VIRUS 3-RVPPCR: NOT DETECTED
PARAINFLUENZA VIRUS 4-RVPPCR: NOT DETECTED
Parainfluenza Virus 1: NOT DETECTED
Parainfluenza Virus 2: NOT DETECTED
Respiratory Syncytial Virus: NOT DETECTED
Rhinovirus / Enterovirus: NOT DETECTED

## 2019-02-12 LAB — INFLUENZA PANEL BY PCR (TYPE A & B)
INFLBPCR: NEGATIVE
Influenza A By PCR: NEGATIVE

## 2019-02-12 MED ORDER — ALBUTEROL SULFATE (2.5 MG/3ML) 0.083% IN NEBU: 5.0000 mg | INHALATION_SOLUTION | Freq: Once | RESPIRATORY_TRACT | Status: DC

## 2019-02-12 MED ORDER — ALBUTEROL SULFATE HFA 108 (90 BASE) MCG/ACT IN AERS
INHALATION_SPRAY | RESPIRATORY_TRACT | Status: AC
  Administered 2019-02-12: 02:00:00
  Filled 2019-02-12: qty 6.7

## 2019-02-12 NOTE — ED Provider Notes (Signed)
[facility]-EMERGENCY DEPT Provider Note   CSN: [id number] Arrival date & time: 02/11/19  2347    History   Chief Complaint Chief Complaint  Patient presents with  . Fever    HPI Laurie Fields is a 33 y.o. female.     Patient is a 32 year old female with past medical history of migraines, depression, asthma, pseudotumor.  She presents today for evaluation of cough and fever.  She states that this started 2 days ago and is worsening.  She reports traveling from [place] to [place] on Monday, then started with a fever and shortness of breath yesterday.  She denies any known exposures of the coronavirus.  The history is provided by the patient.  Fever  Max temp prior to arrival:  103 Temp source:  Oral Severity:  Moderate Onset quality:  Sudden Duration:  24 hours Timing:  Constant Progression:  Worsening Chronicity:  New Relieved by:  Nothing Worsened by:  Nothing   Past Medical History:  Diagnosis Date  . Asthma    Hx intubation R/T asthma  . Blurred vision   . Depression   . Migraine   . Obesity   . Ovarian cyst   . Pseudotumor cerebri   . Vertigo     Patient Active Problem List   Diagnosis Date Noted  . Paresthesia 11/04/2018  . Chronic migraine 10/08/2017  . Pseudotumor cerebri 10/08/2017    Past Surgical History:  Procedure Laterality Date  . ABDOMINAL HYSTERECTOMY    . carpel  tunnel release    . CESAREAN SECTION    . peritoneal cyst       OB History   No obstetric history on file.      Home Medications    Prior to Admission medications   Medication Sig Start Date End Date Taking? Authorizing Provider  albuterol (ACCUNEB) 0.63 MG/3ML nebulizer solution Take 1 ampule by nebulization every 6 (six) hours as needed for wheezing.    [provider]  albuterol (PROVENTIL HFA;VENTOLIN HFA) 108 (90 Base) MCG/ACT inhaler Inhale 2 puffs into the lungs every 6 (six) hours as needed for wheezing or shortness of  breath.    [provider]  cyclobenzaprine (FLEXERIL) 10 MG tablet Take 0.5-1 tablets (5-10 mg total) by mouth 2 (two) times daily as needed for muscle spasms. 07/26/18   Harris, Cammy Copa, PA-C  naproxen (NAPROSYN) 375 MG tablet Take 1 tablet twice daily as needed for pain. Patient not taking: Reported on 11/08/2018 05/20/18   Pricilla Loveless, MD  oxyCODONE-acetaminophen (PERCOCET) 5-325 MG tablet Take 1-2 tablets by mouth every 6 (six) hours as needed for severe pain. Patient not taking: Reported on 11/08/2018 07/26/18   Arthor Captain, PA-C  rizatriptan (MAXALT-MLT) 10 MG disintegrating tablet Take 1 tablet (10 mg total) by mouth as needed. May repeat in 2 hours if needed 11/04/18   Levert Feinstein, MD  Topiramate ER (TROKENDI XR) 200 MG CP24 Take 200 mg by mouth at bedtime. 11/11/18   Levert Feinstein, MD    Family History Family History  Problem Relation Age of Onset  . Hypertension Other   . Diabetes Other   . Cancer Other   . CAD Other   . Leukemia Other   . Graves' disease Mother   . Other Mother        low blood sugar  . Hypertension Father   . Diabetes Maternal Grandmother   . Colon polyps Maternal Grandmother   . Glaucoma Maternal Grandfather   . Cataracts Maternal  Grandfather   . Prostate cancer Maternal Grandfather   . Diabetes Paternal Grandmother   . Cataracts Paternal Grandfather   . Prostate cancer Paternal Grandfather     Social History Social History   Tobacco Use  . Smoking status: Never Smoker  . Smokeless tobacco: Never Used  Substance Use Topics  . Alcohol use: No  . Drug use: No     Allergies   Serevent [salmeterol]   Review of Systems Review of Systems  Constitutional: Positive for fever.  All other systems reviewed and are negative.    Physical Exam Updated Vital Signs There were no vitals taken for this visit.  Physical Exam Vitals signs and nursing note reviewed.  Constitutional:      General: She is not in acute distress.     Appearance: She is well-developed. She is not diaphoretic.  HENT:     Head: Normocephalic and atraumatic.  Neck:     Musculoskeletal: Normal range of motion and neck supple.  Cardiovascular:     Rate and Rhythm: Normal rate and regular rhythm.     Heart sounds: No murmur. No friction rub. No gallop.   Pulmonary:     Effort: Pulmonary effort is normal. No respiratory distress.     Breath sounds: Normal breath sounds. No wheezing.  Abdominal:     General: Bowel sounds are normal. There is no distension.     Palpations: Abdomen is soft.     Tenderness: There is no abdominal tenderness.  Musculoskeletal: Normal range of motion.  Skin:    General: Skin is warm and dry.  Neurological:     Mental Status: She is alert and oriented to person, place, and time.      ED Treatments / Results  Labs (all labs ordered are listed, but only abnormal results are displayed) Labs Reviewed  RESPIRATORY PANEL BY PCR  INFLUENZA PANEL BY PCR (TYPE A & B)    EKG None  Radiology No results found.  Procedures Procedures (including critical care time)  Medications Ordered in ED Medications  albuterol (PROVENTIL) (2.5 MG/3ML) 0.083% nebulizer solution 5 mg (has no administration in time range)     Initial Impression / Assessment and Plan / ED Course  I have reviewed the triage vital signs and the nursing notes.  Pertinent labs & imaging results that were available during my care of the patient were reviewed by me and considered in my medical decision making (see chart for details).  Patient presents with URI symptoms.  She was recently in [place] and traveled home here to [place].  Her oxygen saturations are 100% and vital signs are stable upon presentation.  She is afebrile here with a temperature of 98.  Chest x-ray and flu swab was obtained, both of these were unremarkable.  A respiratory virus panel was also obtained which is currently pending and I am told will not be reported until  later this afternoon.  Patient also educated on the coronavirus.  According to the current information available and access to test kits, this patient does not meet criteria for testing, but was advised to perform home quarantine for the next 2 weeks.  Patient then made comments about "wasted time" regarding her visit.  I attempted to ask her what her expectations were of her visit.  Her response was "just give me my discharge instructions".  She would not tell me exactly why she was upset, but she was offered coronavirus testing if this was her expectation for coming here,  which she angrily declined.  Final Clinical Impressions(s) / ED Diagnoses   Final diagnoses:  None    ED Discharge Orders    None       Geoffery Lyons, MD 02/12/19 [id number]

## 2019-02-12 NOTE — ED Notes (Signed)
3 tubes sent to lab

## 2019-02-12 NOTE — ED Notes (Addendum)
Pt upset with provider feeling like we wasted her time here. Pt verbalized discharge instructions and follow up care. Alert and ambulatory. Vitals stable and in no acute distress. Stated "my mother is a Engineer, civil (consulting) and if she tells me I need to go the hospital again then I will"

## 2019-02-12 NOTE — ED Notes (Signed)
XR at bedside

## 2019-02-12 NOTE — Discharge Instructions (Addendum)
Drink plenty of fluids and get plenty of rest.  Over-the-counter medications as needed for symptomatic relief.  Return to the emergency department if your symptoms significantly worsen or change.

## 2019-02-13 ENCOUNTER — Telehealth: Payer: Self-pay | Admitting: Family

## 2019-02-13 DIAGNOSIS — R0602 Shortness of breath: Secondary | ICD-10-CM

## 2019-02-13 MED ORDER — BENZONATATE 200 MG PO CAPS: 200.0000 mg | ORAL_CAPSULE | Freq: Three times a day (TID) | ORAL | 0 refills | Status: DC | PRN

## 2019-02-13 MED ORDER — ALBUTEROL SULFATE 0.63 MG/3ML IN NEBU: 1.0000 | INHALATION_SOLUTION | Freq: Four times a day (QID) | RESPIRATORY_TRACT | 5 refills | Status: DC | PRN

## 2019-02-13 MED ORDER — ALBUTEROL SULFATE HFA 108 (90 BASE) MCG/ACT IN AERS: 2.0000 | INHALATION_SPRAY | RESPIRATORY_TRACT | 2 refills | Status: DC | PRN

## 2019-02-13 NOTE — Progress Notes (Signed)
Greater than 5 minutes, yet less than 10 minutes of time have been spent researching, coordinating, and implementing care for this patient today.  Thank you for the details you included in the comment boxes. Those details are very helpful in determining the best course of treatment for you and help [place] to provide the best care.  If the shortness of breath is severe, you need to go to the ED ASAP to be evaluated. I can treat these symptoms if they are non-emergent. See below.  E-Visit for Corona Virus Screening  Based on your current symptoms, it seems unlikely that your symptoms are related to the Coronavirus.   Coronavirus disease 2019 (COVID-19) is a respiratory illness that can spread from person to person. The virus that causes COVID-19 is a new virus that was first identified in the country of [place] but is now found in multiple other countries and has spread to the [place].  Symptoms associated with the virus are mild to severe fever, cough, and shortness of breath. There is currently no vaccine to protect against COVID-19, and there is no specific antiviral treatment for the virus.   To be considered HIGH RISK for Coronavirus (COVID-19), you have to meet the following criteria:  . Traveled to [place], [place], [place], [place] or [place]; or in the [place] to [place], [place], [place], or [place]; and have fever, cough, and shortness of breath within the last 2 weeks of travel OR  . Been in close contact with a person diagnosed with COVID-19 within the last 2 weeks and have fever, cough, and shortness of breath  . IF YOU DO NOT MEET THESE CRITERIA, YOU ARE CONSIDERED LOW RISK FOR COVID-19.   It is vitally important that if you feel that you have an infection such as this virus or any other virus that you stay home and away from places where you may spread it to others.  You should self-quarantine for 14 days if you have symptoms that could potentially be coronavirus and avoid  contact with people age 25 and older.   You can use medication such as A prescription cough medication called Tessalon Perles 100 mg. You may take 1-2 capsules every 8 hours as needed for cough and A prescription inhaler called Albuterol MDI 90 mcg /actuation 2 puffs every 4 hours as needed for shortness of breath, wheezing, cough  You may also take acetaminophen (Tylenol) as needed for fever.   Reduce your risk of any infection by using the same precautions used for avoiding the common cold or flu:  Marland Kitchen Wash your hands often with soap and warm water for at least 20 seconds.  If soap and water are not readily available, use an alcohol-based hand sanitizer with at least 60% alcohol.  . If coughing or sneezing, cover your mouth and nose by coughing or sneezing into the elbow areas of your shirt or coat, into a tissue or into your sleeve (not your hands). . Avoid shaking hands with others and consider head nods or verbal greetings only. . Avoid touching your eyes, nose, or mouth with unwashed hands.  . Avoid close contact with people who are sick. . Avoid places or events with large numbers of people in one location, like concerts or sporting events. . Carefully consider travel plans you have or are making. . If you are planning any travel outside or inside the [place], visit the CDC's Travelers' Health webpage for the latest health notices. . If you  have some symptoms but not all symptoms, continue to monitor at home and seek medical attention if your symptoms worsen. . If you are having a medical emergency, call 911.  HOME CARE . Only take medications as instructed by your medical team. . Drink plenty of fluids and get plenty of rest. . A steam or ultrasonic humidifier can help if you have congestion.   GET HELP RIGHT AWAY IF: . You develop worsening fever. . You become short of breath . You cough up blood. . Your symptoms become more severe MAKE SURE YOU   Understand these  instructions.  Will watch your condition.  Will get help right away if you are not doing well or get worse.  Your e-visit answers were reviewed by a board certified advanced clinical practitioner to complete your personal care plan.  Depending on the condition, your plan could have included both over the counter or prescription medications.  If there is a problem please reply once you have received a response from your provider. Your safety is important to [place].  If you have drug allergies check your prescription carefully.    You can use MyChart to ask questions about today's visit, request a non-urgent call back, or ask for a work or school excuse for 24 hours related to this e-Visit. If it has been greater than 24 hours you will need to follow up with your provider, or enter a new e-Visit to address those concerns. You will get an e-mail in the next two days asking about your experience.  I hope that your e-visit has been valuable and will speed your recovery. Thank you for using e-visits.

## 2019-02-22 IMAGING — CT CT ABD-PELV W/ CM
2 of 4 series · 16 of 46 positions shown, 18 images · IV contrast (agent unspecified)
Comparison: 02/17/2017 CT abdomen and pelvis.

CLINICAL DATA: 31 y/o F; bilateral flank and pelvic pain. Burning
with urination.

EXAM:
CT ABDOMEN AND PELVIS WITH CONTRAST
TECHNIQUE: Multidetector CT imaging of the abdomen and pelvis was performed
using the standard protocol following bolus administration of
intravenous contrast.
CONTRAST:  100 cc Xsovue-AWW

[Series 2: axial st · axial · 0.72mm/px · z∈[+1197,+1577]mm · 13 of 86 slices shown, 15 images]
[im 5/86  soft-tissue]
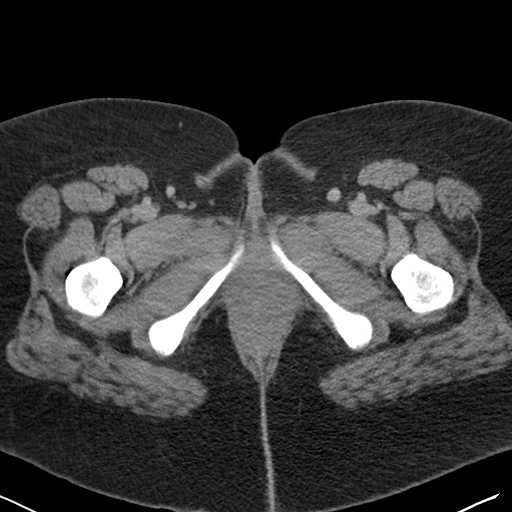
[im 5/86  bone]
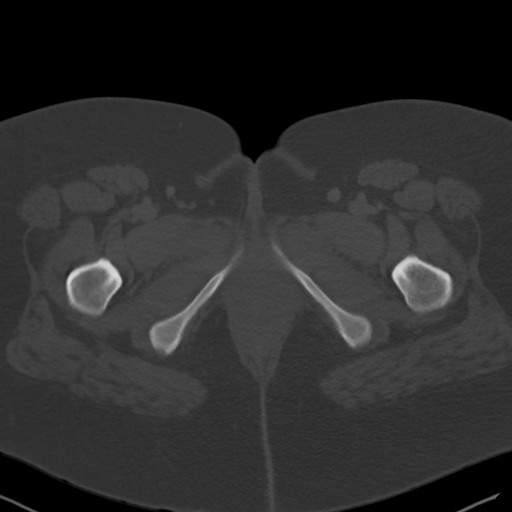
[im 13/86  soft-tissue]
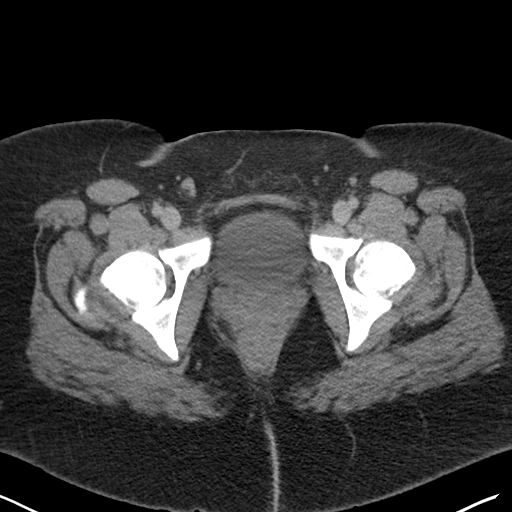
[im 17/86  soft-tissue]
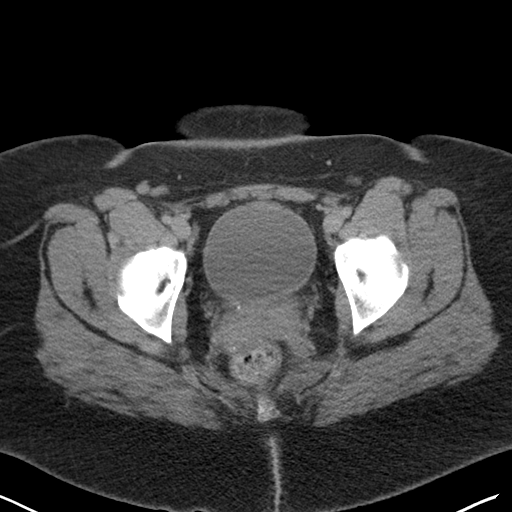
[im 25/86  soft-tissue]
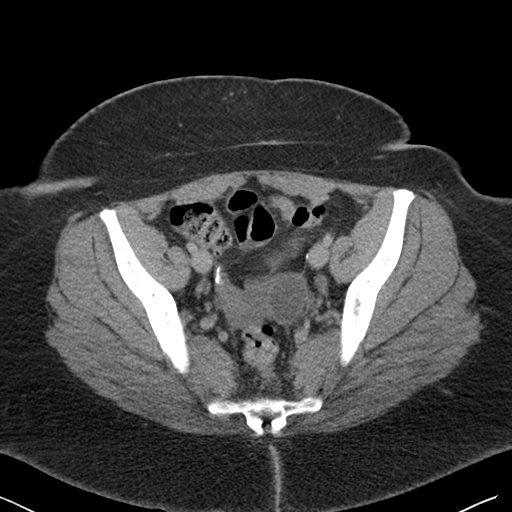
[im 29/86  soft-tissue]
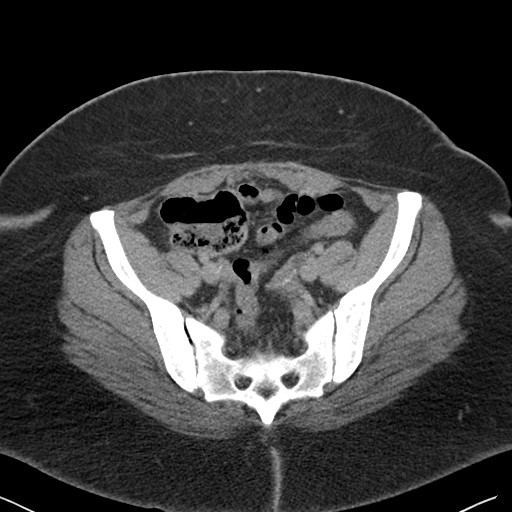
[im 37/86  soft-tissue]
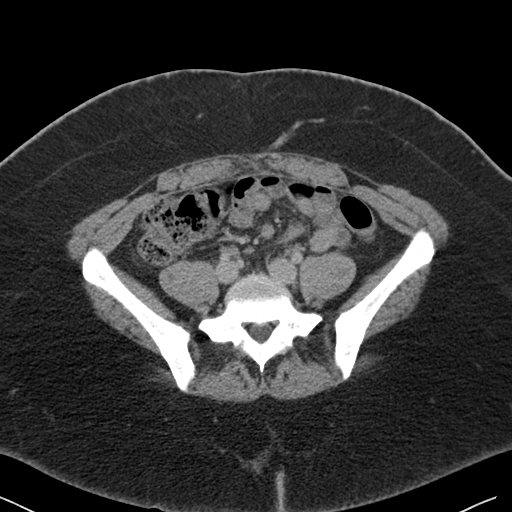
[im 45/86  soft-tissue]
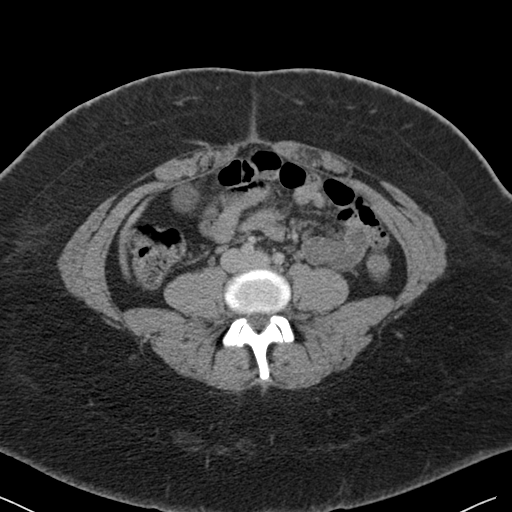
[im 49/86  soft-tissue]
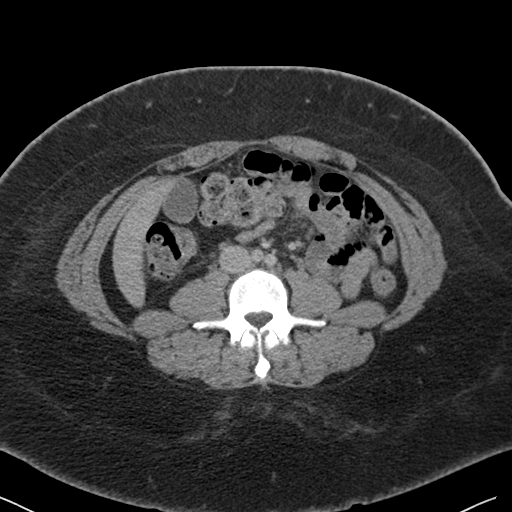
[im 57/86  soft-tissue]
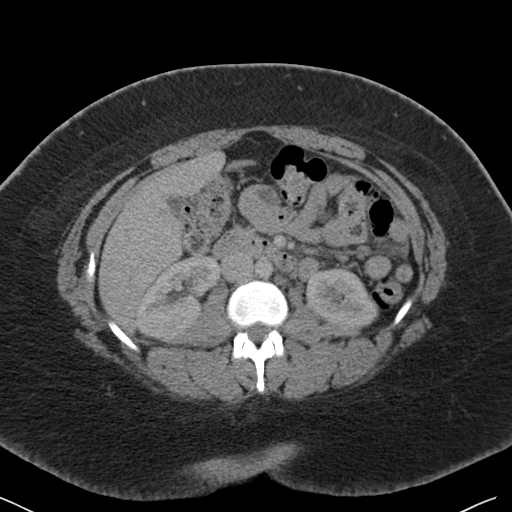
[im 57/86  bone]
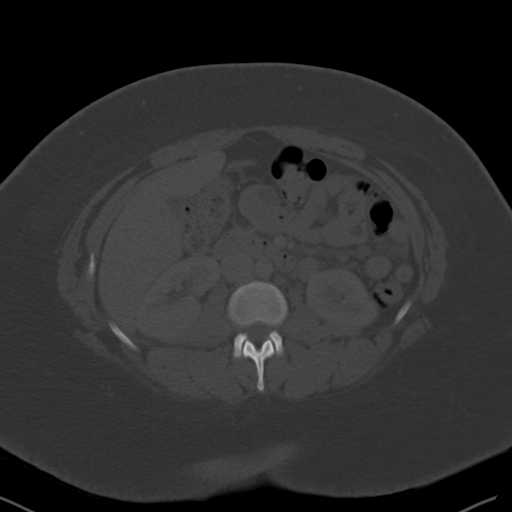
[im 61/86  soft-tissue]
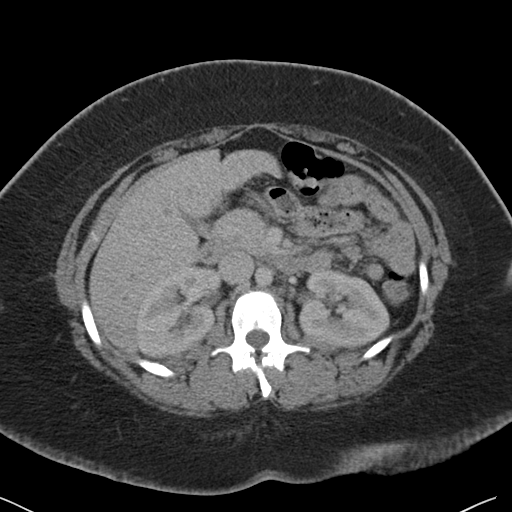
[im 69/86  soft-tissue]
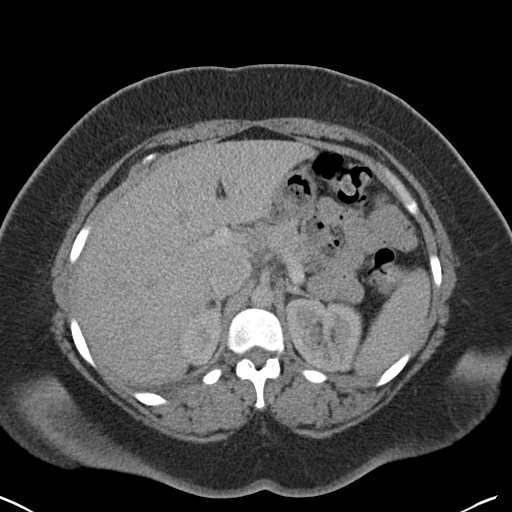
[im 73/86  soft-tissue]
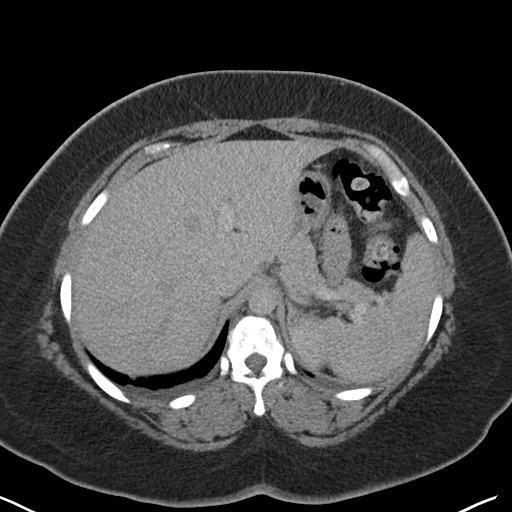
[im 81/86  soft-tissue]
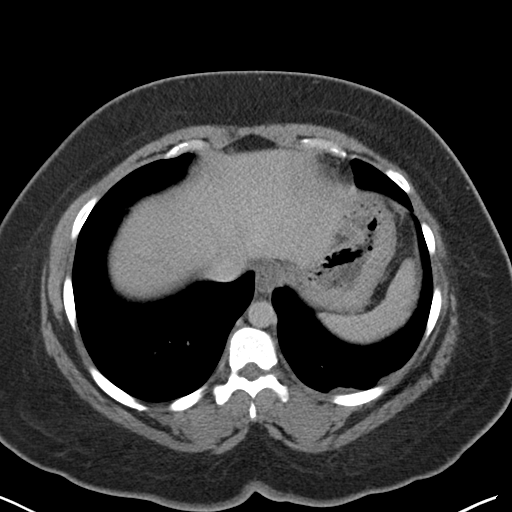

[Series 4: coronal st · coronal · 0.70mm/px · 3 of 89 slices shown]
[im 30/89  soft-tissue]
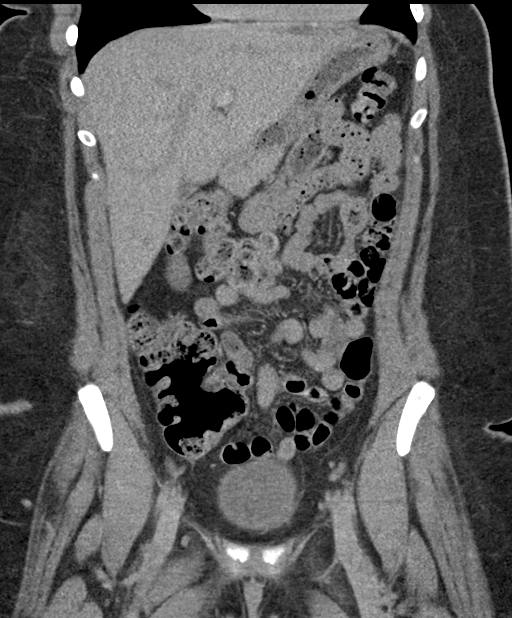
[im 40/89  soft-tissue]
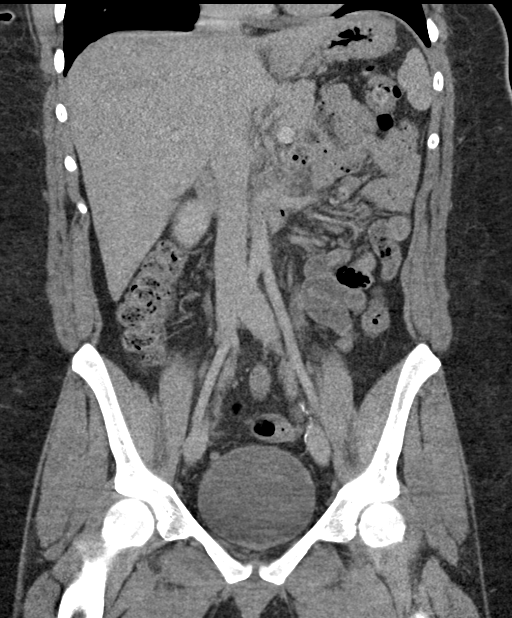
[im 49/89  soft-tissue]
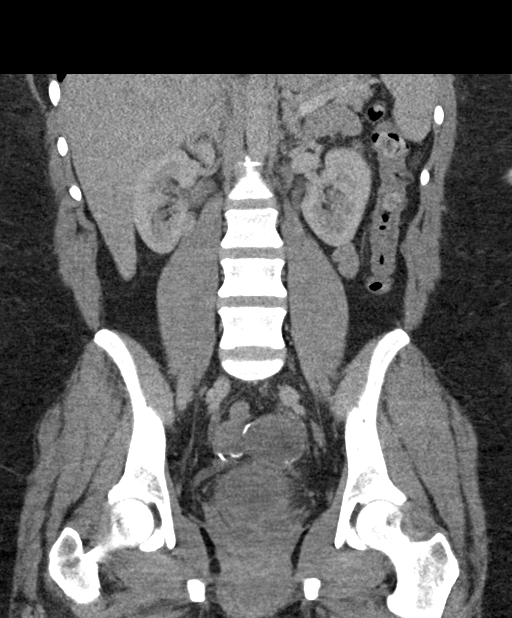

[16 of 46 positions shown; findings below may reference images not displayed]

FINDINGS: Lower chest: No acute abnormality.

Hepatobiliary: No focal liver abnormality is seen. No gallstones,
gallbladder wall thickening, or biliary dilatation.

Pancreas: Unremarkable. No pancreatic ductal dilatation or
surrounding inflammatory changes.

Spleen: Normal in size without focal abnormality.

Adrenals/Urinary Tract: Adrenal glands are unremarkable. Kidneys are
normal, without renal calculi, focal lesion, or hydronephrosis.
Bladder is unremarkable.

Stomach/Bowel: Stomach is within normal limits. Appendix appears
normal. No evidence of bowel wall thickening, distention, or
inflammatory changes.

Vascular/Lymphatic: No significant vascular findings are present. No
enlarged abdominal or pelvic lymph nodes.

Reproductive: Hysterectomy. Left adnexal round simple cyst measuring
3.1 cm.

Other: No abdominal wall hernia or abnormality. No abdominopelvic
ascites.

Musculoskeletal: No acute or significant osseous findings.
IMPRESSION: No acute process identified.  Unremarkable CT of abdomen and pelvis.

By: Aljory Britto M.D.

## 2019-02-25 ENCOUNTER — Telehealth: Payer: Self-pay | Admitting: Nurse Practitioner

## 2019-02-25 ENCOUNTER — Encounter (HOSPITAL_COMMUNITY): Payer: Self-pay

## 2019-02-25 ENCOUNTER — Ambulatory Visit (HOSPITAL_COMMUNITY)
Admission: EM | Admit: 2019-02-25 | Discharge: 2019-02-25 | Disposition: A | Discharge status: Home / Self Care | Payer: Self-pay | Attending: Family Medicine | Admitting: Family Medicine

## 2019-02-25 ENCOUNTER — Other Ambulatory Visit: Payer: Self-pay

## 2019-02-25 DIAGNOSIS — J4521 Mild intermittent asthma with (acute) exacerbation: Secondary | ICD-10-CM

## 2019-02-25 DIAGNOSIS — R05 Cough: Secondary | ICD-10-CM

## 2019-02-25 DIAGNOSIS — R059 Cough, unspecified: Secondary | ICD-10-CM

## 2019-02-25 MED ORDER — ALBUTEROL SULFATE (2.5 MG/3ML) 0.083% IN NEBU: 2.5000 mg | INHALATION_SOLUTION | Freq: Four times a day (QID) | RESPIRATORY_TRACT | 12 refills | Status: DC | PRN

## 2019-02-25 NOTE — ED Provider Notes (Signed)
[facility]   [id number] 02/25/19 Arrival Time: 1839   CC: URI symptoms   SUBJECTIVE: History from: patient.  Laurie Fields is a 33 y.o. female hx significant for asthma, depression, obesity, pseudotumor cerebri, and vertigo, who presents with chest tightness, SOB, and dry cough that began on 02/11/2019.  Also mentions fever with tmax of "104" last week, now resolved.  Does admit to recent travel from [place] for spring break prior to symptoms.  States close contacts tested positive for COVID.  Was evaluated in the ED on 02/11/2019 and had a negative chest x-ray at that time.  Did not meet criteria for COVID testing.  Has been in self-quarantine for the past 14 days.  Recently treated for potential asthma flare by PCP with prednisone dose pack and has been using inhaler without relief.  Requests nebulizer today.   Symptoms are made worse at night.  Reports previous symptoms in the past related to asthma.   Denies fever, chills, fatigue, sinus pain, rhinorrhea, sore throat, wheezing, chest pain, nausea, changes in bowel or bladder habits.    ROS: As per HPI.  Past Medical History:  Diagnosis Date  . Asthma    Hx intubation R/T asthma  . Blurred vision   . Depression   . Migraine   . Obesity   . Ovarian cyst   . Pseudotumor cerebri   . Vertigo    Past Surgical History:  Procedure Laterality Date  . ABDOMINAL HYSTERECTOMY    . carpel  tunnel release    . CESAREAN SECTION    . peritoneal cyst     Allergies  Allergen Reactions  . Serevent [Salmeterol] Hives, Nausea And Vomiting and Other (See Comments)    Hallucinations Pt states tolerates Atrovent well   No current facility-administered medications on file prior to encounter.    Current Outpatient Medications on File Prior to Encounter  Medication Sig Dispense Refill  . albuterol (PROVENTIL HFA;VENTOLIN HFA) 108 (90 Base) MCG/ACT inhaler Inhale 2 puffs into the lungs every 4 (four) hours as needed for wheezing or  shortness of breath. 1 Inhaler 2  . rizatriptan (MAXALT-MLT) 10 MG disintegrating tablet Take 1 tablet (10 mg total) by mouth as needed. May repeat in 2 hours if needed 12 tablet 11  . Topiramate ER (TROKENDI XR) 200 MG CP24 Take 200 mg by mouth at bedtime. 30 capsule 11   Social History   Socioeconomic History  . Marital status: Single    Spouse name: Not on file  . Number of children: 4  . Years of education: college  . Highest education level: Not on file  Occupational History  . Occupation: Merchandiser, retail  . Financial resource strain: Not on file  . Food insecurity:    Worry: Not on file    Inability: Not on file  . Transportation needs:    Medical: Not on file    Non-medical: Not on file  Tobacco Use  . Smoking status: Never Smoker  . Smokeless tobacco: Never Used  Substance and Sexual Activity  . Alcohol use: No  . Drug use: No  . Sexual activity: Not on file  Lifestyle  . Physical activity:    Days per week: Not on file    Minutes per session: Not on file  . Stress: Not on file  Relationships  . Social connections:    Talks on phone: Not on file    Gets together: Not on file    Attends religious service: Not  on file    Active member of club or organization: Not on file    Attends meetings of clubs or organizations: Not on file    Relationship status: Not on file  . Intimate partner violence:    Fear of current or ex partner: Not on file    Emotionally abused: Not on file    Physically abused: Not on file    Forced sexual activity: Not on file  Other Topics Concern  . Not on file  Social History Narrative   Lives at home with her children. She has four living sons. She had one daughter that passed away at age 43 with Aplastic anemia.   Right-handed.   2-3 cups of caffeine weekly.   Family History  Problem Relation Age of Onset  . Hypertension Other   . Diabetes Other   . Cancer Other   . CAD Other   . Leukemia Other   . Graves' disease Mother    . Other Mother        low blood sugar  . Hypertension Father   . Diabetes Maternal Grandmother   . Colon polyps Maternal Grandmother   . Glaucoma Maternal Grandfather   . Cataracts Maternal Grandfather   . Prostate cancer Maternal Grandfather   . Diabetes Paternal Grandmother   . Cataracts Paternal Grandfather   . Prostate cancer Paternal Grandfather     OBJECTIVE:  Vitals:   02/25/19 1858  BP: 118/78  Pulse: 79  Resp: 17  Temp: 99.3 F (37.4 C)  TempSrc: Oral  SpO2: 100%     General appearance: alert; well-appearing; speaking in full sentences and tolerating own secretions HEENT: NCAT; Ears: EACs clear, TMs pearly gray; Eyes: PERRL.  EOM grossly intact. Nose: nares patent without rhinorrhea, Throat: oropharynx clear, tonsils non erythematous or enlarged, uvula midline  Neck: supple without LAD Lungs: unlabored respirations, cough: absent; no respiratory distress; decreased lung sounds; difficult to assess lung sounds due to body habitus Heart: regular rate and rhythm.  Radial pulses 2+ symmetrical bilaterally Skin: warm and dry Psychological: alert and cooperative; normal mood and affect  ASSESSMENT & PLAN:  1. Mild intermittent asthma with exacerbation     Meds ordered this encounter  Medications  . albuterol (PROVENTIL) (2.5 MG/3ML) 0.083% nebulizer solution    Sig: Take 3 mLs (2.5 mg total) by nebulization every 6 (six) hours as needed for wheezing or shortness of breath.    Dispense:  75 mL    Refill:  12    Order Specific Question:   Supervising Provider    Answer:   Eustace Moore [[id number]]   Declines chest x-ray today.  Would like to try outpatient therapy first. Get plenty of rest and push fluids Nebulizer given in office Albuterol solution prescribed.  Use as needed for symptoms Zyrtec-D prescribed for nasal congestion, runny nose, and/or sore throat Use OTC medications like ibuprofen or tylenol as needed fever or pain Follow up with PCP on Friday  or early next week to ensure symptoms are improving.   Call or go to ER if you have any new or worsening symptoms fever, chills, nausea, vomiting, chest pain, chest pressure, worsening cough, shortness of breath, wheezing, abdominal pain, changes in bowel or bladder habits, blue extremities, altered mental status, etc...  Reviewed expectations re: course of current medical issues. Questions answered. Outlined signs and symptoms indicating need for more acute intervention. Patient verbalized understanding. After Visit Summary given.         Kindra Bickham,  [place], PA-C 02/25/19 1955

## 2019-02-25 NOTE — Discharge Instructions (Signed)
Declines chest x-ray today.  Would like to try outpatient therapy first. Get plenty of rest and push fluids Nebulizer given in office Albuterol solution prescribed.  Use as needed for symptoms Zyrtec-D prescribed for nasal congestion, runny nose, and/or sore throat Use OTC medications like ibuprofen or tylenol as needed fever or pain Follow up with PCP on Friday or early next week to ensure symptoms are improving.   Call or go to ER if you have any new or worsening symptoms fever, chills, nausea, vomiting, chest pain, chest pressure, worsening cough, shortness of breath, wheezing, abdominal pain, changes in bowel or bladder habits, blue extremities, altered mental status, etc..Marland Kitchen

## 2019-02-25 NOTE — ED Triage Notes (Signed)
Pt reports that she had a asthma flare up since the 18th of March; she states her symptoms have no improved or resolved at all after using inhaler prescribed and a prednisone pack.  Pt also states she was under a 14 day quarantine by PCP after returning from [place].

## 2019-02-25 NOTE — Progress Notes (Signed)
Based on what you shared with me it looks like you are still symptomatic with shortness of breath and,that should be evaluated in a face to face office visit. Need to make sure that we are treating you properly.   NOTE: If you entered your credit card information for this eVisit, you will not be charged. You may see a "hold" on your card for the $30 but that hold will drop off and you will not have a charge processed.  If you are having a true medical emergency please call 911.  If you need an urgent face to face visit, [facility] has four urgent care centers for your convenience.  If you need care fast and have a high deductible or no insurance consider:   [email or web address] to reserve your spot online an avoid wait times  [facility] [address] [place], [place] [address] 8 am to 8 pm Monday-Friday 10 am to 4 pm Saturday-Sunday *Across the street from [employer]  [address] [place], [address] 8 am to 5 pm Monday-Friday * In the [facility] on the [facility]   The following sites will take your  insurance:  . [facility]  [phone] Get Driving Directions Find a Provider at this Location  [address] [place], [place] [address] . 10 am to 8 pm Monday-Friday . 12 pm to 8 pm Saturday-Sunday   . [facility] at [facility]  [phone] Get Driving Directions Find a Provider at this Location  [address] [place], [place] [address] . 8 am to 8 pm Monday-Friday . 9 am to 6 pm Saturday . 11 am to 6 pm Sunday   . [facility] at [facility]  [phone] Get Driving Directions  [address] [place], [place] [address] . 8 am to 8 pm Monday-Friday . 8 am to 4 pm Saturday-Sunday   Your e-visit answers were reviewed by a board certified advanced clinical practitioner to complete your personal care plan.   5 minutes spent reviewing and documenting in chart.

## 2019-03-06 ENCOUNTER — Ambulatory Visit: Payer: Medicare Other | Admitting: Neurology

## 2019-04-13 ENCOUNTER — Telehealth: Payer: Self-pay | Admitting: Family

## 2019-04-13 DIAGNOSIS — J069 Acute upper respiratory infection, unspecified: Secondary | ICD-10-CM

## 2019-04-13 MED ORDER — FLUTICASONE PROPIONATE 50 MCG/ACT NA SUSP: 2.0000 | Freq: Every day | NASAL | 6 refills | Status: DC

## 2019-04-13 NOTE — Progress Notes (Signed)
We are sorry you are not feeling well.  Here is how we plan to help!  Based on what you have shared with me, it looks like you may have a viral upper respiratory infection.  Upper respiratory infections are caused by a large number of viruses; however, rhinovirus is the most common cause.   Symptoms vary from person to person, with common symptoms including sore throat, cough, and fatigue or lack of energy.  A low-grade fever of up to 100.4 may present, but is often uncommon.  Symptoms vary however, and are closely related to a person's age or underlying illnesses.  The most common symptoms associated with an upper respiratory infection are nasal discharge or congestion, cough, sneezing, headache and pressure in the ears and face.  These symptoms usually persist for about 3 to 10 days, but can last up to 2 weeks.  It is important to know that upper respiratory infections do not cause serious illness or complications in most cases.    Upper respiratory infections can be transmitted from person to person, with the most common method of transmission being a person's hands.  The virus is able to live on the skin and can infect other persons for up to 2 hours after direct contact.  Also, these can be transmitted when someone coughs or sneezes; thus, it is important to cover the mouth to reduce this risk.  To keep the spread of the illness at bay, good hand hygiene is very important.  This is an infection that is most likely caused by a virus. There are no specific treatments other than to help you with the symptoms until the infection runs its course.  We are sorry you are not feeling well.  Here is how we plan to help!   For nasal congestion, you may use an oral decongestants such as Mucinex D or if you have glaucoma or high blood pressure use plain Mucinex.  Saline nasal spray or nasal drops can help and can safely be used as often as needed for congestion.  For your congestion, I have prescribed Fluticasone  nasal spray one spray in each nostril twice a day  If you do not have a history of heart disease, hypertension, diabetes or thyroid disease, prostate/bladder issues or glaucoma, you may also use Sudafed to treat nasal congestion.  It is highly recommended that you consult with a pharmacist or your primary care physician to ensure this medication is safe for you to take.     If you have a cough, you may use cough suppressants such as Delsym and Robitussin.  If you have glaucoma or high blood pressure, you can also use Coricidin HBP.    Approximately 5 minutes was spent documenting and reviewing patient's chart.    If you have a sore or scratchy throat, use a saltwater gargle-  to  teaspoon of salt dissolved in a 4-ounce to 8-ounce glass of warm water.  Gargle the solution for approximately 15-30 seconds and then spit.  It is important not to swallow the solution.  You can also use throat lozenges/cough drops and Chloraseptic spray to help with throat pain or discomfort.  Warm or cold liquids can also be helpful in relieving throat pain.  For headache, pain or general discomfort, you can use Ibuprofen or Tylenol as directed.   Some authorities believe that zinc sprays or the use of Echinacea may shorten the course of your symptoms.   HOME CARE . Only take medications as instructed by   your medical team. . Be sure to drink plenty of fluids. Water is fine as well as fruit juices, sodas and electrolyte beverages. You may want to stay away from caffeine or alcohol. If you are nauseated, try taking small sips of liquids. How do you know if you are getting enough fluid? Your urine should be a pale yellow or almost colorless. . Get rest. . Taking a steamy shower or using a humidifier may help nasal congestion and ease sore throat pain. You can place a towel over your head and breathe in the steam from hot water coming from a faucet. . Using a saline nasal spray works much the same way. . Cough drops,  hard candies and sore throat lozenges may ease your cough. . Avoid close contacts especially the very young and the elderly . Cover your mouth if you cough or sneeze . Always remember to wash your hands.   GET HELP RIGHT AWAY IF: . You develop worsening fever. . If your symptoms do not improve within 10 days . You develop yellow or green discharge from your nose over 3 days. . You have coughing fits . You develop a severe head ache or visual changes. . You develop shortness of breath, difficulty breathing or start having chest pain . Your symptoms persist after you have completed your treatment plan  MAKE SURE YOU   Understand these instructions.  Will watch your condition.  Will get help right away if you are not doing well or get worse.  Your e-visit answers were reviewed by a board certified advanced clinical practitioner to complete your personal care plan. Depending upon the condition, your plan could have included both over the counter or prescription medications. Please review your pharmacy choice. If there is a problem, you may call our nursing hot line at and have the prescription routed to another pharmacy. Your safety is important to us. If you have drug allergies check your prescription carefully.   You can use MyChart to ask questions about today's visit, request a non-urgent call back, or ask for a work or school excuse for 24 hours related to this e-Visit. If it has been greater than 24 hours you will need to follow up with your provider, or enter a new e-Visit to address those concerns. You will get an e-mail in the next two days asking about your experience.  I hope that your e-visit has been valuable and will speed your recovery. Thank you for using e-visits.      

## 2019-04-14 MED ORDER — AMOXICILLIN 500 MG PO CAPS: 1000.0000 mg | ORAL_CAPSULE | Freq: Two times a day (BID) | ORAL | 0 refills | Status: DC

## 2019-04-14 NOTE — Addendum Note (Signed)
Addended by: Ofilia Neas on: 04/14/2019 08:15 AM   Modules accepted: Orders

## 2019-05-07 ENCOUNTER — Telehealth: Payer: Self-pay | Admitting: Physician Assistant

## 2019-05-07 DIAGNOSIS — Z20822 Contact with and (suspected) exposure to covid-19: Secondary | ICD-10-CM

## 2019-05-07 NOTE — Progress Notes (Signed)
  E-Visit for Corona Virus Screening  Based on what you have shared with me, you need to seek an evaluation for a severe illness that is causing your symptoms which may be coronavirus or some other illness. I recommend that you be seen and evaluated "face to face". Our Emergency Departments are best equipped to handle patients with severe symptoms.  You will be evaluated by the ER provider (or higher level of care provider) who will determine whether you need formal testing.   I recommend the following:  . If you are having a true medical emergency please call 911. . If you are considered high risk for Corona virus because of a known exposure, fever, shortness of breath and cough, OR if you have severe symptoms of any kind, seek medical care at an emergency room.   . [facility] Emergency Department [address], [place], [place] [address] [phone]  . [facility] [place] Emergency Department [address], [place], [place] [address] [phone]  . [facility] Emergency Department [address], [place], [place] [address] [phone]  . [facility] Emergency Department [address], [place], [place] [address] [phone]  . [facility] Emergency Department [address], [place], [place] [address] [phone]  NOTE: If you entered your credit card information for this eVisit, you will not be charged. You may see a "hold" on your card for the $35 but that hold will drop off and you will not have a charge processed.   Your e-visit answers were reviewed by a board certified advanced clinical practitioner to complete your personal care plan.  Thank you for using e-Visits.  

## 2019-05-21 ENCOUNTER — Encounter (HOSPITAL_COMMUNITY): Payer: Self-pay

## 2019-05-21 ENCOUNTER — Other Ambulatory Visit: Payer: Self-pay

## 2019-05-21 ENCOUNTER — Emergency Department (HOSPITAL_COMMUNITY): Payer: HRSA Program

## 2019-05-21 ENCOUNTER — Emergency Department (HOSPITAL_COMMUNITY)
Admission: EM | Admit: 2019-05-21 | Discharge: 2019-05-21 | Disposition: A | Discharge status: Home / Self Care | Payer: HRSA Program | Attending: Emergency Medicine | Admitting: Emergency Medicine

## 2019-05-21 DIAGNOSIS — J029 Acute pharyngitis, unspecified: Secondary | ICD-10-CM | POA: Diagnosis not present

## 2019-05-21 DIAGNOSIS — Z20828 Contact with and (suspected) exposure to other viral communicable diseases: Secondary | ICD-10-CM | POA: Diagnosis not present

## 2019-05-21 DIAGNOSIS — Z79899 Other long term (current) drug therapy: Secondary | ICD-10-CM | POA: Insufficient documentation

## 2019-05-21 DIAGNOSIS — R509 Fever, unspecified: Secondary | ICD-10-CM | POA: Diagnosis present

## 2019-05-21 LAB — GROUP A STREP BY PCR: Group A Strep by PCR: NOT DETECTED

## 2019-05-21 MED ORDER — PENICILLIN G BENZATHINE 1200000 UNIT/2ML IM SUSP
1.2000 10*6.[IU] | Freq: Once | INTRAMUSCULAR | Status: AC
  Administered 2019-05-21: 1.2 10*6.[IU] via INTRAMUSCULAR
  Filled 2019-05-21: qty 2

## 2019-05-21 MED ORDER — DEXAMETHASONE SODIUM PHOSPHATE 10 MG/ML IJ SOLN
10.0000 mg | Freq: Once | INTRAMUSCULAR | Status: AC
  Administered 2019-05-21: 10 mg via INTRAMUSCULAR
  Filled 2019-05-21: qty 1

## 2019-05-21 MED ORDER — PENICILLIN G BENZATHINE & PROC 1200000 UNIT/2ML IM SUSP: 1.2000 10*6.[IU] | Freq: Once | INTRAMUSCULAR | Status: DC

## 2019-05-21 MED ORDER — LIDOCAINE VISCOUS HCL 2 % MT SOLN
15.0000 mL | Freq: Once | OROMUCOSAL | Status: AC
  Administered 2019-05-21: 15 mL via OROMUCOSAL
  Filled 2019-05-21: qty 15

## 2019-05-21 MED ORDER — IBUPROFEN 200 MG PO TABS
600.0000 mg | ORAL_TABLET | Freq: Once | ORAL | Status: AC
  Administered 2019-05-21: 600 mg via ORAL
  Filled 2019-05-21: qty 3

## 2019-05-21 NOTE — ED Triage Notes (Signed)
Pt states sore throat x 3 days, fever (up to 102), non productive cough, cough, chills, body aches. Pt states boyfriend has COVID

## 2019-05-21 NOTE — Discharge Instructions (Signed)
Your symptoms are likely due to strep throat, you were treated for this with antibiotics today, you were also given steroids to help with swelling.  Please continue using Motrin and Tylenol every 6 hours as needed for pain and fever, you can also use Chloraseptic throat spray or receptacle throat lozenges.  Make sure you are drinking plenty of fluids.

## 2019-05-21 NOTE — ED Provider Notes (Signed)
[facility]-EMERGENCY DEPT Provider Note   CSN: [id number] Arrival date & time: 05/21/19  1448    History   Chief Complaint Chief Complaint  Patient presents with  . Fever  . Generalized Body Aches    HPI Laurie Fields is a 33 y.o. female.     Laurie Fields is a 33 y.o. female with a history of asthma, pseudotumor cerebri, ovarian cyst, migraines and obesity, who presents to the emergency department for evaluation of fevers, fatigue, shortness of breath, cough, and sore throat.  Symptoms started about 3 days ago.  She reports her throat has gotten progressively worse.  She reports is very painful to swallow.  Denies difficulty breathing.  Denies significant pain in her neck does report some swelling over her lymph nodes.  She has had fevers at home for the past 3 days.  Reports that her chest feels a bit tight and she is short of breath has had some intermittent cough.  Denies persistent chest pain.  No abdominal pain, nausea, vomiting or diarrhea.  Does report some generalized myalgias.  No rash.  No other aggravating or alleviating factors.  Does report that her boyfriend tested positive for coronavirus, no other sick contacts.     Past Medical History:  Diagnosis Date  . Asthma    Hx intubation R/T asthma  . Blurred vision   . Depression   . Migraine   . Obesity   . Ovarian cyst   . Pseudotumor cerebri   . Vertigo     Patient Active Problem List   Diagnosis Date Noted  . Paresthesia 11/04/2018  . Chronic migraine 10/08/2017  . Pseudotumor cerebri 10/08/2017    Past Surgical History:  Procedure Laterality Date  . ABDOMINAL HYSTERECTOMY    . carpel  tunnel release    . CESAREAN SECTION    . peritoneal cyst       OB History   No obstetric history on file.      Home Medications    Prior to Admission medications   Medication Sig Start Date End Date Taking? Authorizing Provider  albuterol (PROVENTIL HFA;VENTOLIN HFA) 108  (90 Base) MCG/ACT inhaler Inhale 2 puffs into the lungs every 4 (four) hours as needed for wheezing or shortness of breath. 02/13/19   Withrow, Everardo AllJohn C, FNP  albuterol (PROVENTIL) (2.5 MG/3ML) 0.083% nebulizer solution Take 3 mLs (2.5 mg total) by nebulization every 6 (six) hours as needed for wheezing or shortness of breath. 02/25/19   Wurst, Gren[place], PA-C  amoxicillin (AMOXIL) 500 MG capsule Take 2 capsules (1,000 mg total) by mouth 2 (two) times daily. 04/14/19   Ofilia Neaslark, Michael L, PA-C  fluticasone (FLONASE) 50 MCG/ACT nasal spray Place 2 sprays into both nostrils daily. 04/13/19   Jannifer RodneyHawks, Christy A, FNP  rizatriptan (MAXALT-MLT) 10 MG disintegrating tablet Take 1 tablet (10 mg total) by mouth as needed. May repeat in 2 hours if needed 11/04/18   Levert FeinsteinYan, Yijun, MD  Topiramate ER (TROKENDI XR) 200 MG CP24 Take 200 mg by mouth at bedtime. 11/11/18   Levert FeinsteinYan, Yijun, MD    Family History Family History  Problem Relation Age of Onset  . Hypertension Other   . Diabetes Other   . Cancer Other   . CAD Other   . Leukemia Other   . Graves' disease Mother   . Other Mother        low blood sugar  . Hypertension Father   . Diabetes Maternal Grandmother   . Colon  polyps Maternal Grandmother   . Glaucoma Maternal Grandfather   . Cataracts Maternal Grandfather   . Prostate cancer Maternal Grandfather   . Diabetes Paternal Grandmother   . Cataracts Paternal Grandfather   . Prostate cancer Paternal Grandfather     Social History Social History   Tobacco Use  . Smoking status: Never Smoker  . Smokeless tobacco: Never Used  Substance Use Topics  . Alcohol use: No  . Drug use: No     Allergies   Serevent [salmeterol]   Review of Systems Review of Systems  Constitutional: Positive for chills and fever.  HENT: Positive for sore throat and trouble swallowing. Negative for congestion and rhinorrhea.   Respiratory: Positive for cough and shortness of breath.   Cardiovascular: Negative for chest pain.   Gastrointestinal: Negative for abdominal pain, diarrhea, nausea and vomiting.  Genitourinary: Negative for dysuria.  Musculoskeletal: Positive for myalgias. Negative for arthralgias.  Skin: Negative for color change and rash.  Neurological: Positive for headaches. Negative for dizziness, syncope and light-headedness.     Physical Exam Updated Vital Signs BP 119/75   Pulse (!) 108   Temp 99.8 F (37.7 C) (Oral)   Resp 16   Wt (!) 137 kg   SpO2 100%   BMI 50.26 kg/m   Physical Exam Vitals signs and nursing note reviewed.  Constitutional:      General: She is not in acute distress.    Appearance: Normal appearance. She is well-developed and normal weight. She is not ill-appearing or diaphoretic.  HENT:     Head: Normocephalic and atraumatic.     Nose: Nose normal.     Mouth/Throat:     Pharynx: Oropharyngeal exudate and posterior oropharyngeal erythema present.     Comments: Posterior oropharynx erythematous with significant tonsillar edema, but no kissing tonsils, large white exudates to bilateral tonsils, uvula midline, no trismus, no difficulty tolerating secretions, normal phonation.  No concern for PTA. Eyes:     General:        Right eye: No discharge.        Left eye: No discharge.  Neck:     Musculoskeletal: Neck supple. No neck rigidity.  Cardiovascular:     Rate and Rhythm: Normal rate and regular rhythm.     Heart sounds: Normal heart sounds. No murmur. No friction rub. No gallop.   Pulmonary:     Effort: Pulmonary effort is normal. No respiratory distress.     Comments: Respirations equal and unlabored, patient able to speak in full sentences, lungs clear to auscultation bilaterally Abdominal:     General: Abdomen is flat. Bowel sounds are normal. There is no distension.     Palpations: Abdomen is soft. There is no mass.     Tenderness: There is no abdominal tenderness. There is no guarding.     Comments: Abdomen soft, nondistended, nontender to palpation in  all quadrants without guarding or peritoneal signs  Musculoskeletal:        General: No deformity.  Lymphadenopathy:     Cervical: Cervical adenopathy present.  Skin:    General: Skin is warm and dry.  Neurological:     Mental Status: She is alert and oriented to person, place, and time.     Coordination: Coordination normal.  Psychiatric:        Mood and Affect: Mood normal.        Behavior: Behavior normal.      ED Treatments / Results  Labs (all labs ordered are listed,  but only abnormal results are displayed) Labs Reviewed  NOVEL CORONAVIRUS, NAA (HOSPITAL ORDER, SEND-OUT TO REF LAB)  GROUP A STREP BY PCR    EKG None  Radiology Dg Chest Portable 1 View  Result Date: 05/21/2019 CLINICAL DATA:  Cough EXAM: PORTABLE CHEST 1 VIEW COMPARISON:  February 12, 2019 FINDINGS: No edema or consolidation. Heart size and pulmonary vascularity are normal. No adenopathy. No bone lesions. IMPRESSION: No edema or consolidation. Electronically Signed   By: Bretta BangWilliam  Woodruff III M.D.   On: 05/21/2019 17:14    Procedures Procedures (including critical care time)  Medications Ordered in ED Medications  dexamethasone (DECADRON) injection 10 mg (10 mg Intramuscular Given 05/21/19 1811)  ibuprofen (ADVIL) tablet 600 mg (600 mg Oral Given 05/21/19 1812)  lidocaine (XYLOCAINE) 2 % viscous mouth solution 15 mL (15 mLs Mouth/Throat Given 05/21/19 1812)  penicillin g benzathine (BICILLIN LA) 1200000 UNIT/2ML injection 1.2 Million Units (1.2 Million Units Intramuscular Given 05/21/19 1811)     Initial Impression / Assessment and Plan / ED Course  I have reviewed the triage vital signs and the nursing notes.  Pertinent labs & imaging results that were available during my care of the patient were reviewed by me and considered in my medical decision making (see chart for details).  Patient presents for evaluation of fevers, fatigue, shortness of breath and sore throat, symptoms present over the last 3  days.  On arrival patient with low-grade fever and tachycardia.  Posterior oropharynx is erythematous and swollen with large white exudates present concerning for strep throat, uvula is midline and I do not see evidence of a peritonsillar abscess and patient is tolerating secretions and breathing no acute distress.  She also complains of some shortness of breath, lungs are clear on auscultation and oxygen at 100%.  Known coronavirus contact.  Will test for strep, coronavirus and get chest x-ray will plan to go ahead and treat for strep with penicillin, Decadron, ibuprofen and viscous lidocaine.  Strep test returned negative, but given significant findings on exam so feel this is very likely strep pharyngitis, patient has been treated with antibiotics.  Chest x-ray is clear.  Covid test is pending.  Patient reports she is feeling better after being treated with medications here in the ED.  Vitals remained stable.  Will be discharged home with continued supportive treatment, discussed home quarantine given COVID test and will contact with  positive patient.  Return precautions discussed.  She expresses understanding and agreement with plan.  Discharged home in good condition.  Laurie Fields was evaluated in Emergency Department on 05/23/2019 for the symptoms described in the history of present illness. She was evaluated in the context of the global COVID-19 pandemic, which necessitated consideration that the patient might be at risk for infection with the SARS-CoV-2 virus that causes COVID-19. Institutional protocols and algorithms that pertain to the evaluation of patients at risk for COVID-19 are in a state of rapid change based on information released by regulatory bodies including the CDC and federal and state organizations. These policies and algorithms were followed during the patient's care in the ED.   Final Clinical Impressions(s) / ED Diagnoses   Final diagnoses:  Acute pharyngitis,  unspecified etiology    ED Discharge Orders    None       Legrand RamsFord, Jaquarius Seder N, PA-C 05/23/19 1216    Vanetta MuldersZackowski, Scott, MD 05/24/19 1708

## 2019-05-23 LAB — NOVEL CORONAVIRUS, NAA (HOSP ORDER, SEND-OUT TO REF LAB; TAT 18-24 HRS): SARS-CoV-2, NAA: NOT DETECTED

## 2019-10-18 ENCOUNTER — Encounter (HOSPITAL_COMMUNITY): Payer: Self-pay | Admitting: Emergency Medicine

## 2019-10-18 ENCOUNTER — Emergency Department (HOSPITAL_COMMUNITY): Payer: HRSA Program

## 2019-10-18 ENCOUNTER — Other Ambulatory Visit: Payer: Self-pay

## 2019-10-18 ENCOUNTER — Emergency Department (HOSPITAL_COMMUNITY)
Admission: EM | Admit: 2019-10-18 | Discharge: 2019-10-18 | Disposition: A | Discharge status: Home / Self Care | Payer: HRSA Program | Attending: Emergency Medicine | Admitting: Emergency Medicine

## 2019-10-18 DIAGNOSIS — R0789 Other chest pain: Secondary | ICD-10-CM

## 2019-10-18 DIAGNOSIS — Z79899 Other long term (current) drug therapy: Secondary | ICD-10-CM | POA: Insufficient documentation

## 2019-10-18 DIAGNOSIS — R0600 Dyspnea, unspecified: Secondary | ICD-10-CM

## 2019-10-18 DIAGNOSIS — J45909 Unspecified asthma, uncomplicated: Secondary | ICD-10-CM | POA: Diagnosis not present

## 2019-10-18 DIAGNOSIS — U071 COVID-19: Secondary | ICD-10-CM

## 2019-10-18 NOTE — ED Triage Notes (Signed)
Patient tested positive for covid on the 20th of November. Pt c/o SOB, headache that hasn't gotten better after taking tylenol or motrin, and bilateral rib pain (left greater than right). Pt states her dr told her to come to the hospital bc she has hx of asthma.

## 2019-10-18 NOTE — ED Provider Notes (Signed)
[facility]-EMERGENCY DEPT Provider Note   CSN: [id number] Arrival date & time: 10/18/19  0014     History   Chief Complaint Chief Complaint  Patient presents with  . Shortness of Breath    covid + 11/20  . Headache    HPI Laurie Fields is a 33 y.o. female.     Patient is a 33 year old female with history of asthma, migraines, obesity, pseudotumor.  She presents today for evaluation of chest tightness and shortness of breath.  Patient was diagnosed with COVID-19 2 days ago.  She spoke to her doctor who told her to come to the ER to be evaluated.  Patient denies fevers.  The history is provided by the patient.  Shortness of Breath Severity:  Moderate Onset quality:  Gradual Duration:  2 days Timing:  Constant Progression:  Worsening Chronicity:  New Context: URI   Relieved by:  Nothing Worsened by:  Nothing Ineffective treatments:  None tried Associated symptoms: headaches   Headache   Past Medical History:  Diagnosis Date  . Asthma    Hx intubation R/T asthma  . Blurred vision   . Depression   . Migraine   . Obesity   . Ovarian cyst   . Pseudotumor cerebri   . Vertigo     Patient Active Problem List   Diagnosis Date Noted  . Paresthesia 11/04/2018  . Chronic migraine 10/08/2017  . Pseudotumor cerebri 10/08/2017    Past Surgical History:  Procedure Laterality Date  . ABDOMINAL HYSTERECTOMY    . carpel  tunnel release    . CESAREAN SECTION    . peritoneal cyst       OB History   No obstetric history on file.      Home Medications    Prior to Admission medications   Medication Sig Start Date End Date Taking? Authorizing Provider  albuterol (PROVENTIL HFA;VENTOLIN HFA) 108 (90 Base) MCG/ACT inhaler Inhale 2 puffs into the lungs every 4 (four) hours as needed for wheezing or shortness of breath. 02/13/19   Withrow, Everardo All, FNP  albuterol (PROVENTIL) (2.5 MG/3ML) 0.083% nebulizer solution Take 3 mLs (2.5 mg total) by  nebulization every 6 (six) hours as needed for wheezing or shortness of breath. 02/25/19   Wurst, [place], PA-C  amoxicillin (AMOXIL) 500 MG capsule Take 2 capsules (1,000 mg total) by mouth 2 (two) times daily. 04/14/19   Ofilia Neas, PA-C  fluticasone (FLONASE) 50 MCG/ACT nasal spray Place 2 sprays into both nostrils daily. 04/13/19   Jannifer Rodney A, FNP  rizatriptan (MAXALT-MLT) 10 MG disintegrating tablet Take 1 tablet (10 mg total) by mouth as needed. May repeat in 2 hours if needed 11/04/18   Levert Feinstein, MD  Topiramate ER (TROKENDI XR) 200 MG CP24 Take 200 mg by mouth at bedtime. 11/11/18   Levert Feinstein, MD    Family History Family History  Problem Relation Age of Onset  . Hypertension Other   . Diabetes Other   . Cancer Other   . CAD Other   . Leukemia Other   . Graves' disease Mother   . Other Mother        low blood sugar  . Hypertension Father   . Diabetes Maternal Grandmother   . Colon polyps Maternal Grandmother   . Glaucoma Maternal Grandfather   . Cataracts Maternal Grandfather   . Prostate cancer Maternal Grandfather   . Diabetes Paternal Grandmother   . Cataracts Paternal Grandfather   . Prostate cancer Paternal  Grandfather     Social History Social History   Tobacco Use  . Smoking status: Never Smoker  . Smokeless tobacco: Never Used  Substance Use Topics  . Alcohol use: No  . Drug use: No     Allergies   Serevent [salmeterol]   Review of Systems Review of Systems  Respiratory: Positive for shortness of breath.   Neurological: Positive for headaches.  All other systems reviewed and are negative.    Physical Exam Updated Vital Signs BP 116/65 (BP Location: Right Arm)   Pulse 75   Temp 98 F (36.7 C) (Oral) Comment: last took tylenol the morning of 11/21  Resp 19   Ht 5\' 5"  (1.651 m)   Wt (!) 138.3 kg   SpO2 100%   BMI 50.75 kg/m   Physical Exam Vitals signs and nursing note reviewed.  Constitutional:      General: She is not in  acute distress.    Appearance: She is well-developed. She is not diaphoretic.  HENT:     Head: Normocephalic and atraumatic.  Neck:     Musculoskeletal: Normal range of motion and neck supple.  Cardiovascular:     Rate and Rhythm: Normal rate and regular rhythm.     Heart sounds: No murmur. No friction rub. No gallop.   Pulmonary:     Effort: Pulmonary effort is normal. No respiratory distress.     Breath sounds: Normal breath sounds. No wheezing, rhonchi or rales.  Abdominal:     General: Bowel sounds are normal. There is no distension.     Palpations: Abdomen is soft.     Tenderness: There is no abdominal tenderness.  Musculoskeletal: Normal range of motion.     Right lower leg: She exhibits no tenderness. No edema.     Left lower leg: She exhibits no tenderness. No edema.  Skin:    General: Skin is warm and dry.  Neurological:     Mental Status: She is alert and oriented to person, place, and time.      ED Treatments / Results  Labs (all labs ordered are listed, but only abnormal results are displayed) Labs Reviewed - No data to display  EKG None  Radiology No results found.  Procedures Procedures (including critical care time)  Medications Ordered in ED Medications - No data to display   Initial Impression / Assessment and Plan / ED Course  I have reviewed the triage vital signs and the nursing notes.  Pertinent labs & imaging results that were available during my care of the patient were reviewed by me and considered in my medical decision making (see chart for details).  Patient presenting here for evaluation of shortness of breath and tightness in her chest.  Patient diagnosed 2 days ago with COVID-19.  In the emergency department, her vitals are stable and oxygen saturations are 100%.  Her heart rate is in the 60s and she appears in no distress.  Her EKG shows a normal sinus rhythm and chest x-ray is negative for cardiopulmonary abnormality.  I see no  indication for any further work-up.  Patient advised that she is likely to feel badly for the next several days.  She is to return if she develops severe difficulty breathing or other problems.  Final Clinical Impressions(s) / ED Diagnoses   Final diagnoses:  None    ED Discharge Orders    None       Veryl Speak, MD 10/18/19 0236

## 2019-10-18 NOTE — Discharge Instructions (Addendum)
Take Tylenol 1000 mg rotated with ibuprofen 600 mg every 4 hours as needed for pain or fever.  Drink plenty of fluids and get plenty of rest.  Isolate at home to prevent spread of Covid.         Infection Prevention Recommendations for Individuals Confirmed to have, or Being Evaluated for, 2019 Novel Coronavirus (COVID-19) Infection Who Receive Care at Home  Individuals who are confirmed to have, or are being evaluated for, COVID-19 should follow the prevention steps below until a healthcare provider or local or state health department says they can return to normal activities.  Stay home except to get medical care You should restrict activities outside your home, except for getting medical care. Do not go to work, school, or public areas, and do not use public transportation or taxis.  Call ahead before visiting your doctor Before your medical appointment, call the healthcare provider and tell them that you have, or are being evaluated for, COVID-19 infection. This will help the healthcare providers office take steps to keep other people from getting infected. Ask your healthcare provider to call the local or state health department.  Monitor your symptoms Seek prompt medical attention if your illness is worsening (e.g., difficulty breathing). Before going to your medical appointment, call the healthcare provider and tell them that you have, or are being evaluated for, COVID-19 infection. Ask your healthcare provider to call the local or state health department.  Wear a facemask You should wear a facemask that covers your nose and mouth when you are in the same room with other people and when you visit a healthcare provider. People who live with or visit you should also wear a facemask while they are in the same room with you.  Separate yourself from other people in your home As much as possible, you should stay in a different room from other people in your home. Also, you  should use a separate bathroom, if available.  Avoid sharing household items You should not share dishes, drinking glasses, cups, eating utensils, towels, bedding, or other items with other people in your home. After using these items, you should wash them thoroughly with soap and water.  Cover your coughs and sneezes Cover your mouth and nose with a tissue when you cough or sneeze, or you can cough or sneeze into your sleeve. Throw used tissues in a lined trash can, and immediately wash your hands with soap and water for at least 20 seconds or use an alcohol-based hand rub.  Wash your [employer]hands Wash your hands often and thoroughly with soap and water for at least 20 seconds. You can use an alcohol-based hand sanitizer if soap and water are not available and if your hands are not visibly dirty. Avoid touching your eyes, nose, and mouth with unwashed hands.   Prevention Steps for Caregivers and Household Members of Individuals Confirmed to have, or Being Evaluated for, COVID-19 Infection Being Cared for in the Home  If you live with, or provide care at home for, a person confirmed to have, or being evaluated for, COVID-19 infection please follow these guidelines to prevent infection:  Follow healthcare providers instructions Make sure that you understand and can help the patient follow any healthcare provider instructions for all care.  Provide for the patients basic needs You should help the patient with basic needs in the home and provide support for getting groceries, prescriptions, and other personal needs.  Monitor the patients symptoms If they are getting sicker, call  his or her medical provider and tell them that the patient has, or is being evaluated for, COVID-19 infection. This will help the healthcare providers office take steps to keep other people from getting infected. Ask the healthcare provider to call the local or state health department.  Limit the number of  people who have contact with the patient If possible, have only one caregiver for the patient. Other household members should stay in another home or place of residence. If this is not possible, they should stay in another room, or be separated from the patient as much as possible. Use a separate bathroom, if available. Restrict visitors who do not have an essential need to be in the home.  Keep older adults, very young children, and other sick people away from the patient Keep older adults, very young children, and those who have compromised immune systems or chronic health conditions away from the patient. This includes people with chronic heart, lung, or kidney conditions, diabetes, and cancer.  Ensure good ventilation Make sure that shared spaces in the home have good air flow, such as from an air conditioner or an opened window, weather permitting.  Wash your hands often Wash your hands often and thoroughly with soap and water for at least 20 seconds. You can use an alcohol based hand sanitizer if soap and water are not available and if your hands are not visibly dirty. Avoid touching your eyes, nose, and mouth with unwashed hands. Use disposable paper towels to dry your hands. If not available, use dedicated cloth towels and replace them when they become wet.  Wear a facemask and gloves Wear a disposable facemask at all times in the room and gloves when you touch or have contact with the patients blood, body fluids, and/or secretions or excretions, such as sweat, saliva, sputum, nasal mucus, vomit, urine, or feces.  Ensure the mask fits over your nose and mouth tightly, and do not touch it during use. Throw out disposable facemasks and gloves after using them. Do not reuse. Wash your hands immediately after removing your facemask and gloves. If your personal clothing becomes contaminated, carefully remove clothing and launder. Wash your hands after handling contaminated clothing. Place  all used disposable facemasks, gloves, and other waste in a lined container before disposing them with other household waste. Remove gloves and wash your hands immediately after handling these items.  Do not share dishes, glasses, or other household items with the patient Avoid sharing household items. You should not share dishes, drinking glasses, cups, eating utensils, towels, bedding, or other items with a patient who is confirmed to have, or being evaluated for, COVID-19 infection. After the person uses these items, you should wash them thoroughly with soap and water.  Wash laundry thoroughly Immediately remove and wash clothes or bedding that have blood, body fluids, and/or secretions or excretions, such as sweat, saliva, sputum, nasal mucus, vomit, urine, or feces, on them. Wear gloves when handling laundry from the patient. Read and follow directions on labels of laundry or clothing items and detergent. In general, wash and dry with the warmest temperatures recommended on the label.  Clean all areas the individual has used often Clean all touchable surfaces, such as counters, tabletops, doorknobs, bathroom fixtures, toilets, phones, keyboards, tablets, and bedside tables, every day. Also, clean any surfaces that may have blood, body fluids, and/or secretions or excretions on them. Wear gloves when cleaning surfaces the patient has come in contact with. Use a diluted bleach solution (e.g.,  dilute bleach with 1 part bleach and 10 parts water) or a household disinfectant with a label that says EPA-registered for coronaviruses. To make a bleach solution at home, add 1 tablespoon of bleach to 1 quart (4 cups) of water. For a larger supply, add  cup of bleach to 1 gallon (16 cups) of water. Read labels of cleaning products and follow recommendations provided on product labels. Labels contain instructions for safe and effective use of the cleaning product including precautions you should take when  applying the product, such as wearing gloves or eye protection and making sure you have good ventilation during use of the product. Remove gloves and wash hands immediately after cleaning.  Monitor yourself for signs and symptoms of illness Caregivers and household members are considered close contacts, should monitor their health, and will be asked to limit movement outside of the home to the extent possible. Follow the monitoring steps for close contacts listed on the symptom monitoring form.   ? If you have additional questions, contact your local health department or call the epidemiologist on call at [phone] (available 24/7). ? This guidance is subject to change. For the most up-to-date guidance from [facility], please refer to their website: [email or web address]

## 2019-12-12 ENCOUNTER — Ambulatory Visit (HOSPITAL_COMMUNITY)
Admission: EM | Admit: 2019-12-12 | Discharge: 2019-12-12 | Disposition: A | Discharge status: Home / Self Care | Payer: BC Managed Care – PPO | Attending: Family Medicine | Admitting: Family Medicine

## 2019-12-12 ENCOUNTER — Encounter (HOSPITAL_COMMUNITY): Payer: Self-pay

## 2019-12-12 ENCOUNTER — Other Ambulatory Visit: Payer: Self-pay

## 2019-12-12 DIAGNOSIS — B9689 Other specified bacterial agents as the cause of diseases classified elsewhere: Secondary | ICD-10-CM | POA: Diagnosis not present

## 2019-12-12 DIAGNOSIS — N3001 Acute cystitis with hematuria: Secondary | ICD-10-CM | POA: Diagnosis present

## 2019-12-12 DIAGNOSIS — Z3202 Encounter for pregnancy test, result negative: Secondary | ICD-10-CM | POA: Diagnosis not present

## 2019-12-12 LAB — POCT URINALYSIS DIP (DEVICE)
Bilirubin Urine: NEGATIVE
Glucose, UA: NEGATIVE mg/dL
Ketones, ur: NEGATIVE mg/dL
Nitrite: NEGATIVE
Protein, ur: NEGATIVE mg/dL
Specific Gravity, Urine: >=1.03 (ref 1.005–1.030)
Urobilinogen, UA: 0.2 mg/dL (ref 0.0–1.0)
pH: 5.5 (ref 5.0–8.0)

## 2019-12-12 LAB — POCT PREGNANCY, URINE: Preg Test, Ur: NEGATIVE

## 2019-12-12 LAB — POC URINE PREG, ED: Preg Test, Ur: NEGATIVE

## 2019-12-12 MED ORDER — PHENAZOPYRIDINE HCL 200 MG PO TABS: 200.0000 mg | ORAL_TABLET | Freq: Three times a day (TID) | ORAL | 0 refills | Status: DC | PRN

## 2019-12-12 MED ORDER — CEFTRIAXONE SODIUM 500 MG IJ SOLR
INTRAMUSCULAR | Status: AC
  Filled 2019-12-12: qty 500

## 2019-12-12 MED ORDER — NITROFURANTOIN MONOHYD MACRO 100 MG PO CAPS: 100.0000 mg | ORAL_CAPSULE | Freq: Two times a day (BID) | ORAL | 0 refills | Status: DC

## 2019-12-12 MED ORDER — PROMETHAZINE HCL 25 MG PO TABS: 25.0000 mg | ORAL_TABLET | Freq: Four times a day (QID) | ORAL | 0 refills | Status: DC | PRN

## 2019-12-12 MED ORDER — CEFTRIAXONE SODIUM 500 MG IJ SOLR
500.0000 mg | Freq: Once | INTRAMUSCULAR | Status: AC
  Administered 2019-12-12: 500 mg via INTRAMUSCULAR

## 2019-12-12 NOTE — ED Provider Notes (Signed)
[place]    CSN: [id number] Arrival date & time: 12/12/19  1337      History   Chief Complaint Chief Complaint  Patient presents with  . Urinary Tract Infection    HPI Laurie Fields is a 34 y.o. female.   HPI  Laurie Fields is a 34 y.o. female presents for evaluation of urinary frequency, urgency and dysuria x 4 days, with bilateral flank pain, endorses subjective fever and chills. Hematuria present yesterday. Reports no concern for STD. Previous history of UTI. Uncertain of prior urine pathology. No LMP recorded. Patient has had a hysterectomy.  Past Medical History:  Diagnosis Date  . Asthma    Hx intubation R/T asthma  . Blurred vision   . Depression   . Migraine   . Obesity   . Ovarian cyst   . Pseudotumor cerebri   . Vertigo     Patient Active Problem List   Diagnosis Date Noted  . Paresthesia 11/04/2018  . Chronic migraine 10/08/2017  . Pseudotumor cerebri 10/08/2017    Past Surgical History:  Procedure Laterality Date  . ABDOMINAL HYSTERECTOMY    . carpel  tunnel release    . CESAREAN SECTION    . peritoneal cyst      OB History   No obstetric history on file.      Home Medications    Prior to Admission medications   Medication Sig Start Date End Date Taking? Authorizing Provider  albuterol (PROVENTIL HFA;VENTOLIN HFA) 108 (90 Base) MCG/ACT inhaler Inhale 2 puffs into the lungs every 4 (four) hours as needed for wheezing or shortness of breath. 02/13/19   Withrow, Elyse Jarvis, FNP  albuterol (PROVENTIL) (2.5 MG/3ML) 0.083% nebulizer solution Take 3 mLs (2.5 mg total) by nebulization every 6 (six) hours as needed for wheezing or shortness of breath. 02/25/19   Wurst, [place], PA-C  amoxicillin (AMOXIL) 500 MG capsule Take 2 capsules (1,000 mg total) by mouth 2 (two) times daily. 04/14/19   Tereasa Coop, PA-C  fluticasone (FLONASE) 50 MCG/ACT nasal spray Place 2 sprays into both nostrils daily. 04/13/19   Evelina Dun A,  FNP  rizatriptan (MAXALT-MLT) 10 MG disintegrating tablet Take 1 tablet (10 mg total) by mouth as needed. May repeat in 2 hours if needed 11/04/18   Marcial Pacas, MD  Topiramate ER (TROKENDI XR) 200 MG CP24 Take 200 mg by mouth at bedtime. 11/11/18   Marcial Pacas, MD    Family History Family History  Problem Relation Age of Onset  . Hypertension Other   . Diabetes Other   . Cancer Other   . CAD Other   . Leukemia Other   . Graves' disease Mother   . Other Mother        low blood sugar  . Hypertension Father   . Diabetes Maternal Grandmother   . Colon polyps Maternal Grandmother   . Glaucoma Maternal Grandfather   . Cataracts Maternal Grandfather   . Prostate cancer Maternal Grandfather   . Diabetes Paternal Grandmother   . Cataracts Paternal Grandfather   . Prostate cancer Paternal Grandfather     Social History Social History   Tobacco Use  . Smoking status: Never Smoker  . Smokeless tobacco: Never Used  Substance Use Topics  . Alcohol use: No  . Drug use: No     Allergies   Serevent [salmeterol]   Review of Systems Review of Systems Pertinent negatives listed in HPI  Physical Exam Triage Vital Signs ED Triage Vitals  Enc Vitals Group     BP 12/12/19 1455 119/81     Pulse Rate 12/12/19 1455 70     Resp 12/12/19 1455 16     Temp 12/12/19 1455 98.6 F (37 C)     Temp Source 12/12/19 1455 Oral     SpO2 12/12/19 1455 100 %     Weight --      Height --      Head Circumference --      Peak Flow --      Pain Score 12/12/19 1454 8     Pain Loc --      Pain Edu? --      Excl. in GC? --    No data found.  Updated Vital Signs BP 119/81 (BP Location: Right Arm)   Pulse 70   Temp 98.6 F (37 C) (Oral)   Resp 16   SpO2 100%   Visual Acuity Right Eye Distance:   Left Eye Distance:   Bilateral Distance:    Right Eye Near:   Left Eye Near:    Bilateral Near:     Physical Exam General appearance: alert, well developed, well nourished, cooperative    Head: Normocephalic, without obvious abnormality, atraumatic Respiratory: Respirations even and unlabored, normal respiratory rate Heart: rate and rhythm normal. No gallop or murmurs noted on exam  Abdomen: BS +, + CVA tenderness, no distention, no rebound tenderness, or no mass Extremities: No gross deformities Skin: Skin color, texture, turgor normal. No rashes seen  Psych: Appropriate mood and affect. Neurologic: Alert, oriented to person, place, and time, thought content appropriate.  UC Treatments / Results  Labs (all labs ordered are listed, but only abnormal results are displayed) Labs Reviewed - No data to display  EKG   Radiology No results found.  Procedures Procedures (including critical care time)  Medications Ordered in UC Medications  cefTRIAXone (ROCEPHIN) injection 500 mg (500 mg Intramuscular Given 12/12/19 1602)    Initial Impression / Assessment and Plan / UC Course  I have reviewed the triage vital signs and the nursing notes.  Pertinent labs & imaging results that were available during my care of the patient were reviewed by me and considered in my medical decision making (see chart for details).    Urinary Tract infection, nuncomplicated. Will treat empirically with a broad spectrum antibiotic. Urine culture pending. Advised patient to follow-up if no improvement with prescribed treatment. An After Visit Summary was printed and given to the patient.Precautions discussed. Red flags discussed. Questions invited and answered.They voiced understanding and agreement. Final Clinical Impressions(s) / UC Diagnoses   Final diagnoses:  Acute cystitis with hematuria     Discharge Instructions     Continue Cranberry juice and water for hydration. If you spike a fever 102 or greater, flank pain doesn't improve within 24 hours or symptoms worsen, go to the ER department for further evaluation.     ED Prescriptions    Medication Sig Dispense Auth. Provider    nitrofurantoin, macrocrystal-monohydrate, (MACROBID) 100 MG capsule Take 1 capsule (100 mg total) by mouth 2 (two) times daily. 20 capsule Bing Neighbors, FNP   phenazopyridine (PYRIDIUM) 200 MG tablet Take 1 tablet (200 mg total) by mouth 3 (three) times daily as needed for pain. 10 tablet Bing Neighbors, FNP   promethazine (PHENERGAN) 25 MG tablet Take 1 tablet (25 mg total) by mouth every 6 (six) hours as needed for nausea or vomiting. 12 tablet Bing Neighbors, FNP  PDMP not reviewed this encounter.   Bing Neighbors, [place] 12/14/19 [phone]

## 2019-12-12 NOTE — Discharge Instructions (Addendum)
Continue Cranberry juice and water for hydration. If you spike a fever 102 or greater, flank pain doesn't improve within 24 hours or symptoms worsen, go to the ER department for further evaluation.

## 2019-12-12 NOTE — ED Triage Notes (Signed)
Patient presents to Urgent Care with complaints of lower back pain, lower abdominal pain, and pain w/ urination since 4-5 days ago. Patient reports she thinks she may have a UTI, denies unprotected intercourse.

## 2019-12-13 LAB — URINE CULTURE: Special Requests: NORMAL

## 2020-09-12 ENCOUNTER — Encounter (HOSPITAL_COMMUNITY): Payer: Self-pay | Admitting: Emergency Medicine

## 2020-09-12 ENCOUNTER — Other Ambulatory Visit: Payer: Self-pay

## 2020-09-12 ENCOUNTER — Emergency Department (HOSPITAL_COMMUNITY): Payer: 59

## 2020-09-12 DIAGNOSIS — R1032 Left lower quadrant pain: Secondary | ICD-10-CM | POA: Insufficient documentation

## 2020-09-12 DIAGNOSIS — J45909 Unspecified asthma, uncomplicated: Secondary | ICD-10-CM | POA: Diagnosis not present

## 2020-09-12 DIAGNOSIS — R112 Nausea with vomiting, unspecified: Secondary | ICD-10-CM | POA: Diagnosis not present

## 2020-09-12 LAB — BASIC METABOLIC PANEL
Anion gap: 7 (ref 5–15)
BUN: 17 mg/dL (ref 6–20)
CO2: 26 mmol/L (ref 22–32)
Calcium: 8.7 mg/dL — ABNORMAL LOW (ref 8.9–10.3)
Chloride: 105 mmol/L (ref 98–111)
Creatinine, Ser: 0.83 mg/dL (ref 0.44–1.00)
GFR, Estimated: >60 mL/min (ref >60)
Glucose, Bld: 96 mg/dL (ref 70–99)
Potassium: 3.5 mmol/L (ref 3.5–5.1)
Sodium: 138 mmol/L (ref 135–145)

## 2020-09-12 LAB — CBC
HCT: 41.1 % (ref 36.0–46.0)
Hemoglobin: 12.8 g/dL (ref 12.0–15.0)
MCH: 27.8 pg (ref 26.0–34.0)
MCHC: 31.1 g/dL (ref 30.0–36.0)
MCV: 89.3 fL (ref 80.0–100.0)
Platelets: 410 10*3/uL — ABNORMAL HIGH (ref 150–400)
RBC: 4.6 MIL/uL (ref 3.87–5.11)
RDW: 13.7 % (ref 11.5–15.5)
WBC: 10.6 10*3/uL — ABNORMAL HIGH (ref 4.0–10.5)
nRBC: 0 % (ref 0.0–0.2)

## 2020-09-12 LAB — URINALYSIS, ROUTINE W REFLEX MICROSCOPIC
Bilirubin Urine: NEGATIVE
Glucose, UA: NEGATIVE mg/dL
Hgb urine dipstick: NEGATIVE
Ketones, ur: 5 mg/dL — AB
Leukocytes,Ua: NEGATIVE
Nitrite: NEGATIVE
Protein, ur: NEGATIVE mg/dL
Specific Gravity, Urine: 1.03 (ref 1.005–1.030)
pH: 5 (ref 5.0–8.0)

## 2020-09-12 NOTE — ED Notes (Signed)
Patient given a cool wet paper towel for arm. No bumps showing and fingers can be bent easily by nurse.

## 2020-09-12 NOTE — ED Triage Notes (Signed)
Patient is complaining of left side pain. Patient states this started a few days ago. Patient is complaining of nausea, vomiting, and dizziness. Patient also stating her left arm is burning and she cant move her fingers after CT. Patient did not receive any medication.

## 2020-09-13 ENCOUNTER — Emergency Department (HOSPITAL_COMMUNITY)
Admission: EM | Admit: 2020-09-13 | Discharge: 2020-09-13 | Disposition: A | Discharge status: Home / Self Care | Payer: 59 | Attending: Emergency Medicine | Admitting: Emergency Medicine

## 2020-09-13 DIAGNOSIS — R1032 Left lower quadrant pain: Secondary | ICD-10-CM

## 2020-09-13 MED ORDER — PROMETHAZINE HCL 25 MG PO TABS: 25.0000 mg | ORAL_TABLET | Freq: Four times a day (QID) | ORAL | 0 refills | Status: DC | PRN

## 2020-09-13 MED ORDER — HYDROCODONE-ACETAMINOPHEN 5-325 MG PO TABS: 1.0000 | ORAL_TABLET | ORAL | 0 refills | Status: DC | PRN

## 2020-09-13 MED ORDER — ONDANSETRON 8 MG PO TBDP
8.0000 mg | ORAL_TABLET | Freq: Once | ORAL | Status: DC
  Filled 2020-09-13: qty 1

## 2020-09-13 NOTE — ED Provider Notes (Signed)
[facility]-EMERGENCY DEPT Provider Note   CSN: [id number] Arrival date & time: 09/12/20  2032   History Chief Complaint  Patient presents with   Flank Pain    Laurie Fields is a 34 y.o. female.  The history is provided by the patient.  Flank Pain  She has history of asthma, pseudotumor cerebri and comes in with pain in the left lower abdomen for the last 5 days.  Pain does radiate around to the back.  She describes a dull pain which is worse with movement and worse with eating and worse with anything touching her skin.  Pain is rated at 10/10.  There has been some associated nausea and vomiting.  She denies fever, chills, sweats.  She denies constipation or diarrhea and denies any urinary difficulty.  She has taken acetaminophen and tried using hot baths without any relief.  She has never had pain like this before.  Past Medical History:  Diagnosis Date   Asthma    Hx intubation R/T asthma   Blurred vision    Depression    Migraine    Obesity    Ovarian cyst    Pseudotumor cerebri    Vertigo     Patient Active Problem List   Diagnosis Date Noted   Paresthesia 11/04/2018   Chronic migraine 10/08/2017   Pseudotumor cerebri 10/08/2017    Past Surgical History:  Procedure Laterality Date   ABDOMINAL HYSTERECTOMY     carpel  tunnel release     CESAREAN SECTION     peritoneal cyst       OB History   No obstetric history on file.     Family History  Problem Relation Age of Onset   Hypertension Other    Diabetes Other    Cancer Other    CAD Other    Leukemia Other    Graves' disease Mother    Other Mother        low blood sugar   Hypertension Father    Diabetes Maternal Grandmother    Colon polyps Maternal Grandmother    Glaucoma Maternal Grandfather    Cataracts Maternal Grandfather    Prostate cancer Maternal Grandfather    Diabetes Paternal Grandmother    Cataracts Paternal Grandfather     Prostate cancer Paternal Grandfather     Social History   Tobacco Use   Smoking status: Never Smoker   Smokeless tobacco: Never Used  Building services engineer Use: Never used  Substance Use Topics   Alcohol use: No   Drug use: No    Home Medications Prior to Admission medications   Medication Sig Start Date End Date Taking? Authorizing Provider  albuterol (PROVENTIL HFA;VENTOLIN HFA) 108 (90 Base) MCG/ACT inhaler Inhale 2 puffs into the lungs every 4 (four) hours as needed for wheezing or shortness of breath. 02/13/19   Withrow, Everardo All, FNP  albuterol (PROVENTIL) (2.5 MG/3ML) 0.083% nebulizer solution Take 3 mLs (2.5 mg total) by nebulization every 6 (six) hours as needed for wheezing or shortness of breath. 02/25/19   Wurst, [place], PA-C  fluticasone (FLONASE) 50 MCG/ACT nasal spray Place 2 sprays into both nostrils daily. 04/13/19   Junie Spencer, FNP  nitrofurantoin, macrocrystal-monohydrate, (MACROBID) 100 MG capsule Take 1 capsule (100 mg total) by mouth 2 (two) times daily. 12/12/19   Bing Neighbors, FNP  phenazopyridine (PYRIDIUM) 200 MG tablet Take 1 tablet (200 mg total) by mouth 3 (three) times daily as needed for pain. 12/12/19  Bing Neighbors, FNP  promethazine (PHENERGAN) 25 MG tablet Take 1 tablet (25 mg total) by mouth every 6 (six) hours as needed for nausea or vomiting. 12/12/19   Bing Neighbors, FNP  rizatriptan (MAXALT-MLT) 10 MG disintegrating tablet Take 1 tablet (10 mg total) by mouth as needed. May repeat in 2 hours if needed 11/04/18   Levert Feinstein, MD  Topiramate ER (TROKENDI XR) 200 MG CP24 Take 200 mg by mouth at bedtime. 11/11/18   Levert Feinstein, MD    Allergies    Serevent [salmeterol]  Review of Systems   Review of Systems  Genitourinary: Positive for flank pain.  All other systems reviewed and are negative.   Physical Exam Updated Vital Signs BP 130/80 (BP Location: Left Arm)    Pulse 71    Temp 98.3 F (36.8 C) (Oral)    Resp 16    Ht 5'  5" (1.651 m)    Wt (!) 139.3 kg    SpO2 100%    BMI 51.09 kg/m   Physical Exam Vitals and nursing note reviewed.   Morbidly obese 34 year old female, appears uncomfortable, but is in no acute distress. Vital signs are normal. Oxygen saturation is 100%, which is normal. Head is normocephalic and atraumatic. PERRLA, EOMI. Oropharynx is clear. Neck is nontender and supple without adenopathy or JVD. Back is nontender and there is no CVA tenderness. Lungs are clear without rales, wheezes, or rhonchi. Chest is nontender. Heart has regular rate and rhythm without murmur. Abdomen is soft, flat, with moderate left lower quadrant tenderness.  There is no rebound or guarding.  There are no masses or hepatosplenomegaly and peristalsis is hypoactive.  No rashes seen. Extremities have no cyanosis or edema, full range of motion is present. Skin is warm and dry without rash. Neurologic: Mental status is normal, cranial nerves are intact, there are no motor or sensory deficits.  ED Results / Procedures / Treatments   Labs (all labs ordered are listed, but only abnormal results are displayed) Labs Reviewed  URINALYSIS, ROUTINE W REFLEX MICROSCOPIC - Abnormal; Notable for the following components:      Result Value   Ketones, ur 5 (*)    All other components within normal limits  BASIC METABOLIC PANEL - Abnormal; Notable for the following components:   Calcium 8.7 (*)    All other components within normal limits  CBC - Abnormal; Notable for the following components:   WBC 10.6 (*)    Platelets 410 (*)    All other components within normal limits   Radiology CT Renal Stone Study  Result Date: 09/12/2020 CLINICAL DATA:  Initial evaluation for acute left flank pain. EXAM: CT ABDOMEN AND PELVIS WITHOUT CONTRAST TECHNIQUE: Multidetector CT imaging of the abdomen and pelvis was performed following the standard protocol without IV contrast. COMPARISON:  Prior CT from 05/20/2018. FINDINGS: Lower chest:  Visualized lung bases are clear. Hepatobiliary: Liver demonstrates a normal unenhanced appearance. Gallbladder within normal limits. No biliary dilatation. Pancreas: Pancreas within normal limits. Spleen: Spleen within normal limits. Adrenals/Urinary Tract: Adrenal glands are normal. Kidneys equal in size without nephrolithiasis or hydronephrosis. No radiopaque calculi seen along the course of either renal collecting system. No hydroureter. Bladder largely decompressed without acute abnormality. No layering stones seen within the bladder lumen. Stomach/Bowel: Stomach within normal limits. No evidence for bowel obstruction. Normal appendix. No acute inflammatory changes seen about the bowels. Vascular/Lymphatic: Intra-abdominal aorta of normal caliber. No pathologically enlarged intra-abdominopelvic lymph nodes. Reproductive: Sequelae  of prior hysterectomy. Ovaries grossly within normal limits on this noncontrast examination. Other: No free air or fluid. Musculoskeletal: No acute osseous abnormality. No discrete or worrisome osseous lesions. IMPRESSION: 1. No CT evidence for nephrolithiasis or obstructive uropathy. 2. No other acute intra-abdominal or pelvic process identified. 3. Sequelae of prior hysterectomy. Electronically Signed   By: Rise Mu M.D.   On: 09/12/2020 22:13    Procedures Procedures   Medications Ordered in ED Medications  ondansetron (ZOFRAN-ODT) disintegrating tablet 8 mg (has no administration in time range)    ED Course  I have reviewed the triage vital signs and the nursing notes.  Pertinent labs & imaging results that were available during my care of the patient were reviewed by me and considered in my medical decision making (see chart for details).  MDM Rules/Calculators/A&P Left lower quadrant pain.  Labs and CT scan had been ordered at triage and CT scan shows no acute process.  Labs are also unremarkable.  Given these findings, the most likely explanation is  shingles.  However, no rashes present.  With negative CT scan, treatment will be symptomatic.  She is given a dose of ondansetron in the ED to make sure her nausea is controlled to the point where she can take pills at home.  Old records are reviewed, and she has no relevant past visits.  Patient told RN that my diagnostic impression was posted and that she needed a pelvic exam.  Although she had no symptoms referable to the genitourinary tract, orders were placed for pelvic exam and cultures.  However, patient left the ED before this could be done, also refused ondansetron.  Final Clinical Impression(s) / ED Diagnoses Final diagnoses:  Left lower quadrant abdominal pain    Rx / DC Orders ED Discharge Orders    None       Dione Booze, MD 09/13/20 [id number]

## 2020-09-13 NOTE — ED Notes (Signed)
MD offered patient a pelvic exam and patient refused.

## 2020-09-13 NOTE — ED Notes (Signed)
Patient left before getting discharge papers.

## 2020-09-13 NOTE — ED Notes (Signed)
Patient states she does not want to take any medication here. Patient states Dr.  gave her a bullshit diagnosis. That everything she complains of warrants a pelvic exam. MD notified.

## 2020-09-13 NOTE — Discharge Instructions (Signed)
Your evaluation here did not show any of the serious causes for abdominal pain.  Your urinalysis was normal and your CT scan showed no problems in the kidneys, bowel, female organs.  I suspect that your pain is from a viral infection called shingles.  If you break out in a rash in the area where you are having the pain, see your primary care provider or come to the emergency department to get medication which is very specific for shingles.

## 2020-10-28 ENCOUNTER — Emergency Department (HOSPITAL_COMMUNITY): Payer: 59

## 2020-10-28 ENCOUNTER — Other Ambulatory Visit: Payer: Self-pay

## 2020-10-28 ENCOUNTER — Emergency Department (HOSPITAL_COMMUNITY)
Admission: EM | Admit: 2020-10-28 | Discharge: 2020-10-28 | Disposition: A | Discharge status: Home / Self Care | Payer: 59 | Attending: Emergency Medicine | Admitting: Emergency Medicine

## 2020-10-28 DIAGNOSIS — R062 Wheezing: Secondary | ICD-10-CM | POA: Diagnosis not present

## 2020-10-28 DIAGNOSIS — R070 Pain in throat: Secondary | ICD-10-CM | POA: Insufficient documentation

## 2020-10-28 DIAGNOSIS — J069 Acute upper respiratory infection, unspecified: Secondary | ICD-10-CM

## 2020-10-28 DIAGNOSIS — R059 Cough, unspecified: Secondary | ICD-10-CM | POA: Diagnosis present

## 2020-10-28 DIAGNOSIS — R0982 Postnasal drip: Secondary | ICD-10-CM | POA: Insufficient documentation

## 2020-10-28 DIAGNOSIS — Z20822 Contact with and (suspected) exposure to covid-19: Secondary | ICD-10-CM | POA: Diagnosis not present

## 2020-10-28 DIAGNOSIS — R0981 Nasal congestion: Secondary | ICD-10-CM | POA: Diagnosis not present

## 2020-10-28 DIAGNOSIS — J4521 Mild intermittent asthma with (acute) exacerbation: Secondary | ICD-10-CM

## 2020-10-28 LAB — RESP PANEL BY RT-PCR (FLU A&B, COVID) ARPGX2
Influenza A by PCR: NEGATIVE
Influenza B by PCR: NEGATIVE
SARS Coronavirus 2 by RT PCR: NEGATIVE

## 2020-10-28 MED ORDER — ALBUTEROL SULFATE HFA 108 (90 BASE) MCG/ACT IN AERS
2.0000 | INHALATION_SPRAY | RESPIRATORY_TRACT | Status: DC | PRN
  Administered 2020-10-28: 2 via RESPIRATORY_TRACT
  Filled 2020-10-28: qty 6.7

## 2020-10-28 MED ORDER — ALBUTEROL SULFATE HFA 108 (90 BASE) MCG/ACT IN AERS: 2.0000 | INHALATION_SPRAY | RESPIRATORY_TRACT | 1 refills | Status: DC | PRN

## 2020-10-28 MED ORDER — PREDNISONE 20 MG PO TABS: 60.0000 mg | ORAL_TABLET | Freq: Every day | ORAL | 0 refills | Status: DC

## 2020-10-28 MED ORDER — PREDNISONE 20 MG PO TABS
60.0000 mg | ORAL_TABLET | Freq: Once | ORAL | Status: AC
  Administered 2020-10-28: 60 mg via ORAL
  Filled 2020-10-28: qty 3

## 2020-10-28 NOTE — ED Triage Notes (Signed)
Pt reports one week of coughing, sneezing, and other symptoms she sts feel like her normal allergy attacks. Hx of asthma and is using her nebulizer and inhalers to help without relief.

## 2020-10-28 NOTE — Discharge Instructions (Addendum)
It was our pleasure to provide your ER care today - we hope that you feel better.  Take prednisone as prescribed. Use albuterol inhaler/treatment every 4 hours as need.   Follow up with your doctor/pulmonary doctor in the next few weeks.   Return to ER right away if worse, increased trouble breathing, or other emergency concern.

## 2020-10-28 NOTE — ED Notes (Signed)
Pt st's she just needs some Prednisone

## 2020-10-28 NOTE — ED Provider Notes (Signed)
[facility] EMERGENCY DEPARTMENT Provider Note   CSN: [id number] Arrival date & time: 10/28/20  1231     History Chief Complaint  Patient presents with  . Cough  . Asthma    Laurie Fields is a 34 y.o. female.  Patient w hx asthma, c/o acute onset non prod cough, rhinorrhea, wheezing, scratchy throat, in the past 2-3 days. Symptoms moderate, persistent. Denies chest pain/discomfort. No headache. No neck pain or stiffness. No fever or chills. No known covid or flu exposure - indicates has previously had covid x 2 and is fully vaccinated. Denies abd pain or nvd. No dysuria or gu c/o. No leg pain or swelling. No rash.   The history is provided by the patient.  Cough Associated symptoms: rhinorrhea, sore throat and wheezing   Associated symptoms: no chest pain, no fever, no headaches and no rash   Asthma Pertinent negatives include no chest pain, no abdominal pain and no headaches.       Past Medical History:  Diagnosis Date  . Asthma    Hx intubation R/T asthma  . Blurred vision   . Depression   . Migraine   . Obesity   . Ovarian cyst   . Pseudotumor cerebri   . Vertigo     Patient Active Problem List   Diagnosis Date Noted  . Paresthesia 11/04/2018  . Chronic migraine 10/08/2017  . Pseudotumor cerebri 10/08/2017    Past Surgical History:  Procedure Laterality Date  . ABDOMINAL HYSTERECTOMY    . carpel  tunnel release    . CESAREAN SECTION    . peritoneal cyst       OB History   No obstetric history on file.     Family History  Problem Relation Age of Onset  . Hypertension Other   . Diabetes Other   . Cancer Other   . CAD Other   . Leukemia Other   . Graves' disease Mother   . Other Mother        low blood sugar  . Hypertension Father   . Diabetes Maternal Grandmother   . Colon polyps Maternal Grandmother   . Glaucoma Maternal Grandfather   . Cataracts Maternal Grandfather   . Prostate cancer Maternal Grandfather   .  Diabetes Paternal Grandmother   . Cataracts Paternal Grandfather   . Prostate cancer Paternal Grandfather     Social History   Tobacco Use  . Smoking status: Never Smoker  . Smokeless tobacco: Never Used  Vaping Use  . Vaping Use: Never used  Substance Use Topics  . Alcohol use: No  . Drug use: No    Home Medications Prior to Admission medications   Medication Sig Start Date End Date Taking? Authorizing Provider  albuterol (PROVENTIL HFA;VENTOLIN HFA) 108 (90 Base) MCG/ACT inhaler Inhale 2 puffs into the lungs every 4 (four) hours as needed for wheezing or shortness of breath. 02/13/19   Withrow, Everardo All, FNP  albuterol (PROVENTIL) (2.5 MG/3ML) 0.083% nebulizer solution Take 3 mLs (2.5 mg total) by nebulization every 6 (six) hours as needed for wheezing or shortness of breath. 02/25/19   Wurst, [place], PA-C  fluticasone (FLONASE) 50 MCG/ACT nasal spray Place 2 sprays into both nostrils daily. 04/13/19   Junie Spencer, FNP  HYDROcodone-acetaminophen (NORCO) 5-325 MG tablet Take 1 tablet by mouth every 4 (four) hours as needed for moderate pain. 09/13/20   Dione Booze, MD  phenazopyridine (PYRIDIUM) 200 MG tablet Take 1 tablet (200 mg total) by  mouth 3 (three) times daily as needed for pain. 12/12/19   Bing Neighbors, FNP  promethazine (PHENERGAN) 25 MG tablet Take 1 tablet (25 mg total) by mouth every 6 (six) hours as needed for nausea or vomiting. 09/13/20   Dione Booze, MD  rizatriptan (MAXALT-MLT) 10 MG disintegrating tablet Take 1 tablet (10 mg total) by mouth as needed. May repeat in 2 hours if needed 11/04/18   Levert Feinstein, MD  Topiramate ER (TROKENDI XR) 200 MG CP24 Take 200 mg by mouth at bedtime. 11/11/18   Levert Feinstein, MD    Allergies    Serevent [salmeterol]  Review of Systems   Review of Systems  Constitutional: Negative for fever.  HENT: Positive for rhinorrhea and sore throat.   Eyes: Negative for redness.  Respiratory: Positive for cough and wheezing.    Cardiovascular: Negative for chest pain, palpitations and leg swelling.  Gastrointestinal: Negative for abdominal pain and vomiting.  Genitourinary: Negative for flank pain.  Musculoskeletal: Negative for back pain, neck pain and neck stiffness.  Skin: Negative for rash.  Neurological: Negative for headaches.  Hematological: Does not bruise/bleed easily.  Psychiatric/Behavioral: Negative for confusion.    Physical Exam Updated Vital Signs BP 118/77 (BP Location: Right Arm)   Pulse 60   Temp 98.6 F (37 C) (Oral)   Resp 18   Ht 1.651 m (5\' 5" )   Wt 134.7 kg   SpO2 100%   BMI 49.42 kg/m   Physical Exam Vitals and nursing note reviewed.  Constitutional:      Appearance: Normal appearance. She is well-developed.  HENT:     Head: Atraumatic.     Nose: Nose normal.     Mouth/Throat:     Mouth: Mucous membranes are moist.     Pharynx: Oropharynx is clear. No oropharyngeal exudate or posterior oropharyngeal erythema.  Eyes:     General: No scleral icterus.    Conjunctiva/sclera: Conjunctivae normal.     Pupils: Pupils are equal, round, and reactive to light.  Neck:     Trachea: No tracheal deviation.     Comments: No stiffness or rigidity.  Cardiovascular:     Rate and Rhythm: Normal rate and regular rhythm.     Pulses: Normal pulses.     Heart sounds: Normal heart sounds. No murmur heard.  No friction rub. No gallop.   Pulmonary:     Effort: Pulmonary effort is normal. No respiratory distress.     Breath sounds: Normal breath sounds.     Comments: ?slight exp wheeze.  Abdominal:     General: Bowel sounds are normal. There is no distension.     Palpations: Abdomen is soft.     Tenderness: There is no abdominal tenderness. There is no guarding.  Genitourinary:    Comments: No cva tenderness.  Musculoskeletal:        General: No swelling or tenderness.     Cervical back: Normal range of motion and neck supple. No rigidity. No muscular tenderness.  Lymphadenopathy:      Cervical: No cervical adenopathy.  Skin:    General: Skin is warm and dry.     Findings: No rash.  Neurological:     Mental Status: She is alert.     Comments: Alert, speech normal.   Psychiatric:        Mood and Affect: Mood normal.     ED Results / Procedures / Treatments   Labs (all labs ordered are listed, but only abnormal results are displayed)  Results for orders placed or performed during the hospital encounter of 10/28/20  Resp Panel by RT-PCR (Flu A&B, Covid) Nasopharyngeal Swab   Specimen: Nasopharyngeal Swab; Nasopharyngeal(NP) swabs in vial transport medium  Result Value Ref Range   SARS Coronavirus 2 by RT PCR NEGATIVE NEGATIVE   Influenza A by PCR NEGATIVE NEGATIVE   Influenza B by PCR NEGATIVE NEGATIVE   DG Chest 2 View  Result Date: 10/28/2020 CLINICAL DATA:  Cough. EXAM: CHEST - 2 VIEW COMPARISON:  October 18, 2019. FINDINGS: The heart size and mediastinal contours are within normal limits. Both lungs are clear. The visualized skeletal structures are unremarkable. IMPRESSION: No active cardiopulmonary disease. Electronically Signed   By: Lupita Raider M.D.   On: 10/28/2020 13:25    EKG None  Radiology DG Chest 2 View  Result Date: 10/28/2020 CLINICAL DATA:  Cough. EXAM: CHEST - 2 VIEW COMPARISON:  October 18, 2019. FINDINGS: The heart size and mediastinal contours are within normal limits. Both lungs are clear. The visualized skeletal structures are unremarkable. IMPRESSION: No active cardiopulmonary disease. Electronically Signed   By: Lupita Raider M.D.   On: 10/28/2020 13:25    Procedures Procedures (including critical care time)  Medications Ordered in ED Medications  albuterol (VENTOLIN HFA) 108 (90 Base) MCG/ACT inhaler 2 puff (has no administration in time range)  predniSONE (DELTASONE) tablet 60 mg (has no administration in time range)    ED Course  I have reviewed the triage vital signs and the nursing notes.  Pertinent labs & imaging  results that were available during my care of the patient were reviewed by me and considered in my medical decision making (see chart for details).    MDM Rules/Calculators/A&P                          Albuterol mdi tx. Labs sent, imaging. Pt requests prednisone rx. Prednisone po.   Reviewed nursing notes and prior charts for additional history.   CXR reviewed/interpreted by me - no pna.   Labs reviewed/interpreted by me - covid neg.   Pt is breathing comfortably. On recheck, no increased wob. Afebrile.   Patient requests pulm eval as outpt. Will provide office #.   Return precautions provided.    Final Clinical Impression(s) / ED Diagnoses Final diagnoses:  None    Rx / DC Orders ED Discharge Orders    None       Cathren Laine, MD 10/28/20 1850

## 2020-12-05 ENCOUNTER — Emergency Department (HOSPITAL_COMMUNITY): Payer: 59

## 2020-12-05 ENCOUNTER — Emergency Department (HOSPITAL_COMMUNITY)
Admission: EM | Admit: 2020-12-05 | Discharge: 2020-12-06 | Disposition: A | Discharge status: Home / Self Care | Payer: 59 | Attending: Emergency Medicine | Admitting: Emergency Medicine

## 2020-12-05 ENCOUNTER — Encounter (HOSPITAL_COMMUNITY): Payer: Self-pay | Admitting: Emergency Medicine

## 2020-12-05 ENCOUNTER — Other Ambulatory Visit: Payer: Self-pay

## 2020-12-05 DIAGNOSIS — M79602 Pain in left arm: Secondary | ICD-10-CM | POA: Diagnosis not present

## 2020-12-05 DIAGNOSIS — R002 Palpitations: Secondary | ICD-10-CM | POA: Insufficient documentation

## 2020-12-05 DIAGNOSIS — Z5321 Procedure and treatment not carried out due to patient leaving prior to being seen by health care provider: Secondary | ICD-10-CM | POA: Diagnosis not present

## 2020-12-05 DIAGNOSIS — R110 Nausea: Secondary | ICD-10-CM | POA: Insufficient documentation

## 2020-12-05 DIAGNOSIS — R0602 Shortness of breath: Secondary | ICD-10-CM | POA: Insufficient documentation

## 2020-12-05 DIAGNOSIS — R079 Chest pain, unspecified: Secondary | ICD-10-CM | POA: Diagnosis not present

## 2020-12-05 LAB — TROPONIN I (HIGH SENSITIVITY): Troponin I (High Sensitivity): 4 ng/L (ref <18)

## 2020-12-05 LAB — CBC
HCT: 40 % (ref 36.0–46.0)
Hemoglobin: 13.1 g/dL (ref 12.0–15.0)
MCH: 28.6 pg (ref 26.0–34.0)
MCHC: 32.8 g/dL (ref 30.0–36.0)
MCV: 87.3 fL (ref 80.0–100.0)
Platelets: 434 10*3/uL — ABNORMAL HIGH (ref 150–400)
RBC: 4.58 MIL/uL (ref 3.87–5.11)
RDW: 14 % (ref 11.5–15.5)
WBC: 8.2 10*3/uL (ref 4.0–10.5)
nRBC: 0 % (ref 0.0–0.2)

## 2020-12-05 LAB — BASIC METABOLIC PANEL
Anion gap: 10 (ref 5–15)
BUN: 9 mg/dL (ref 6–20)
CO2: 25 mmol/L (ref 22–32)
Calcium: 9 mg/dL (ref 8.9–10.3)
Chloride: 105 mmol/L (ref 98–111)
Creatinine, Ser: 0.89 mg/dL (ref 0.44–1.00)
GFR, Estimated: >60 mL/min (ref >60)
Glucose, Bld: 86 mg/dL (ref 70–99)
Potassium: 3.6 mmol/L (ref 3.5–5.1)
Sodium: 140 mmol/L (ref 135–145)

## 2020-12-05 LAB — I-STAT BETA HCG BLOOD, ED (MC, WL, AP ONLY): I-stat hCG, quantitative: <5 m[IU]/mL (ref <5)

## 2020-12-05 NOTE — ED Triage Notes (Signed)
Patient reports intermittent left chest pain /palpitations with SOB and nausea onset last week , denies diaphoresis or fever . Pain radiating to left arm .

## 2020-12-06 LAB — TROPONIN I (HIGH SENSITIVITY): Troponin I (High Sensitivity): 4 ng/L (ref <18)

## 2020-12-06 NOTE — ED Notes (Signed)
Pt called for vitals several times no answer. 

## 2020-12-07 ENCOUNTER — Other Ambulatory Visit: Payer: Self-pay

## 2020-12-07 ENCOUNTER — Encounter (HOSPITAL_COMMUNITY): Payer: Self-pay

## 2020-12-07 ENCOUNTER — Ambulatory Visit (HOSPITAL_COMMUNITY)
Admission: EM | Admit: 2020-12-07 | Discharge: 2020-12-07 | Disposition: A | Discharge status: Home / Self Care | Payer: 59 | Attending: Urgent Care | Admitting: Urgent Care

## 2020-12-07 ENCOUNTER — Institutional Professional Consult (permissible substitution): Payer: 59 | Admitting: Pulmonary Disease

## 2020-12-07 DIAGNOSIS — R0789 Other chest pain: Secondary | ICD-10-CM | POA: Diagnosis not present

## 2020-12-07 DIAGNOSIS — J4530 Mild persistent asthma, uncomplicated: Secondary | ICD-10-CM

## 2020-12-07 DIAGNOSIS — R0602 Shortness of breath: Secondary | ICD-10-CM

## 2020-12-07 DIAGNOSIS — Z86718 Personal history of other venous thrombosis and embolism: Secondary | ICD-10-CM

## 2020-12-07 MED ORDER — PREDNISONE 50 MG PO TABS: 50.0000 mg | ORAL_TABLET | Freq: Every day | ORAL | 0 refills | Status: DC

## 2020-12-07 MED ORDER — ALBUTEROL SULFATE (2.5 MG/3ML) 0.083% IN NEBU: 2.5000 mg | INHALATION_SOLUTION | Freq: Four times a day (QID) | RESPIRATORY_TRACT | 0 refills | Status: AC | PRN

## 2020-12-07 MED ORDER — ALBUTEROL SULFATE HFA 108 (90 BASE) MCG/ACT IN AERS: 2.0000 | INHALATION_SPRAY | RESPIRATORY_TRACT | 0 refills | Status: AC | PRN

## 2020-12-07 NOTE — Progress Notes (Incomplete)
Synopsis: Referred in 11/2020 for evaluation of cough and asthma by Dr. Mammie Lorenzo, MD  Subjective:   PATIENT ID: Laurie Fields GENDER: female DOB: October 24, 1986, MRN: [id number]   HPI  No chief complaint on file.   ***  Past Medical History:  Diagnosis Date  . Asthma    Hx intubation R/T asthma  . Blurred vision   . Depression   . Migraine   . Obesity   . Ovarian cyst   . Pseudotumor cerebri   . Vertigo      Family History  Problem Relation Age of Onset  . Hypertension Other   . Diabetes Other   . Cancer Other   . CAD Other   . Leukemia Other   . Graves' disease Mother   . Other Mother        low blood sugar  . Hypertension Father   . Diabetes Maternal Grandmother   . Colon polyps Maternal Grandmother   . Glaucoma Maternal Grandfather   . Cataracts Maternal Grandfather   . Prostate cancer Maternal Grandfather   . Diabetes Paternal Grandmother   . Cataracts Paternal Grandfather   . Prostate cancer Paternal Grandfather      Social History   Socioeconomic History  . Marital status: Single    Spouse name: Not on file  . Number of children: 4  . Years of education: college  . Highest education level: Not on file  Occupational History  . Occupation: Retail  Tobacco Use  . Smoking status: Never Smoker  . Smokeless tobacco: Never Used  Vaping Use  . Vaping Use: Never used  Substance and Sexual Activity  . Alcohol use: No  . Drug use: No  . Sexual activity: Not on file  Other Topics Concern  . Not on file  Social History Narrative   Lives at home with her children. She has four living sons. She had one daughter that passed away at age 62 with Aplastic anemia.   Right-handed.   2-3 cups of caffeine weekly.   Social Determinants of Health   Financial Resource Strain: Not on file  Food Insecurity: Not on file  Transportation Needs: Not on file  Physical Activity: Not on file  Stress: Not on file  Social Connections: Not on file   Intimate Partner Violence: Not on file     Allergies  Allergen Reactions  . Serevent [Salmeterol] Hives, Nausea And Vomiting and Other (See Comments)    Hallucinations Pt states tolerates Atrovent well     Outpatient Medications Prior to Visit  Medication Sig Dispense Refill  . albuterol (PROVENTIL HFA;VENTOLIN HFA) 108 (90 Base) MCG/ACT inhaler Inhale 2 puffs into the lungs every 4 (four) hours as needed for wheezing or shortness of breath. 1 Inhaler 2  . albuterol (PROVENTIL) (2.5 MG/3ML) 0.083% nebulizer solution Take 3 mLs (2.5 mg total) by nebulization every 6 (six) hours as needed for wheezing or shortness of breath. 75 mL 12  . albuterol (VENTOLIN HFA) 108 (90 Base) MCG/ACT inhaler Inhale 2 puffs into the lungs every 4 (four) hours as needed for wheezing or shortness of breath. 1 each 1  . fluticasone (FLONASE) 50 MCG/ACT nasal spray Place 2 sprays into both nostrils daily. 16 g 6  . HYDROcodone-acetaminophen (NORCO) 5-325 MG tablet Take 1 tablet by mouth every 4 (four) hours as needed for moderate pain. 10 tablet 0  . phenazopyridine (PYRIDIUM) 200 MG tablet Take 1 tablet (200 mg total) by mouth 3 (three) times daily as  needed for pain. 10 tablet 0  . predniSONE (DELTASONE) 20 MG tablet Take 3 tablets (60 mg total) by mouth daily. 12 tablet 0  . promethazine (PHENERGAN) 25 MG tablet Take 1 tablet (25 mg total) by mouth every 6 (six) hours as needed for nausea or vomiting. 20 tablet 0  . rizatriptan (MAXALT-MLT) 10 MG disintegrating tablet Take 1 tablet (10 mg total) by mouth as needed. May repeat in 2 hours if needed 12 tablet 11  . Topiramate ER (TROKENDI XR) 200 MG CP24 Take 200 mg by mouth at bedtime. 30 capsule 11   No facility-administered medications prior to visit.    ROS    Objective:  There were no vitals filed for this visit.   Physical Exam    CBC    Component Value Date/Time   WBC 8.2 12/05/2020 2157   RBC 4.58 12/05/2020 2157   HGB 13.1 12/05/2020  2157   HCT 40.0 12/05/2020 2157   PLT 434 (H) 12/05/2020 2157   MCV 87.3 12/05/2020 2157   MCH 28.6 12/05/2020 2157   MCHC 32.8 12/05/2020 2157   RDW 14.0 12/05/2020 2157   LYMPHSABS 1.7 10/01/2017 2122   MONOABS 0.6 10/01/2017 2122   EOSABS 0.2 10/01/2017 2122   BASOSABS 0.0 10/01/2017 2122   BMP Latest Ref Rng & Units 12/05/2020 09/12/2020 11/08/2018  Glucose 70 - 99 mg/dL 86 96 95  BUN 6 - 20 mg/dL 9 17 12   Creatinine 0.44 - 1.00 mg/dL 2.33 0.07  Sodium 135 - 145 mmol/L 140 138 140  Potassium 3.5 - 5.1 mmol/L 3.6 3.5 3.9  Chloride 98 - 111 mmol/L 105 105 107  CO2 22 - 32 mmol/L 25 26 26   Calcium 8.9 - 10.3 mg/dL 9.0 6.22) )   Chest imaging: CXR 12/05/20 The cardiomediastinal contours are normal. The lungs are clear. Pulmonary vasculature is normal. No consolidation, pleural effusion, or pneumothorax. No acute osseous abnormalities are seen.  CXR 10/28/20 The heart size and mediastinal contours are within normal limits. Both lungs are clear. The visualized skeletal structures are Unremarkable.  CTA Chest 2018 1. No central pulmonary embolus seen. 2. Lungs clear bilaterally.  PFT: No flowsheet data found.    Assessment & Plan:   No diagnosis found.  Discussion: ***    Current Outpatient Medications:  .  albuterol (PROVENTIL HFA;VENTOLIN HFA) 108 (90 Base) MCG/ACT inhaler, Inhale 2 puffs into the lungs every 4 (four) hours as needed for wheezing or shortness of breath., Disp: 1 Inhaler, Rfl: 2 .  albuterol (PROVENTIL) (2.5 MG/3ML) 0.083% nebulizer solution, Take 3 mLs (2.5 mg total) by nebulization every 6 (six) hours as needed for wheezing or shortness of breath., Disp: 75 mL, Rfl: 12 .  albuterol (VENTOLIN HFA) 108 (90 Base) MCG/ACT inhaler, Inhale 2 puffs into the lungs every 4 (four) hours as needed for wheezing or shortness of breath., Disp: 1 each, Rfl: 1 .  fluticasone (FLONASE) 50 MCG/ACT nasal spray, Place 2 sprays into both nostrils daily., Disp:  16 g, Rfl: 6 .  HYDROcodone-acetaminophen (NORCO) 5-325 MG tablet, Take 1 tablet by mouth every 4 (four) hours as needed for moderate pain., Disp: 10 tablet, Rfl: 0 .  phenazopyridine (PYRIDIUM) 200 MG tablet, Take 1 tablet (200 mg total) by mouth 3 (three) times daily as needed for pain., Disp: 10 tablet, Rfl: 0 .  predniSONE (DELTASONE) 20 MG tablet, Take 3 tablets (60 mg total) by mouth daily., Disp: 12 tablet, Rfl: 0 .  promethazine (PHENERGAN) 25 MG  tablet, Take 1 tablet (25 mg total) by mouth every 6 (six) hours as needed for nausea or vomiting., Disp: 20 tablet, Rfl: 0 .  rizatriptan (MAXALT-MLT) 10 MG disintegrating tablet, Take 1 tablet (10 mg total) by mouth as needed. May repeat in 2 hours if needed, Disp: 12 tablet, Rfl: 11 .  Topiramate ER (TROKENDI XR) 200 MG CP24, Take 200 mg by mouth at bedtime., Disp: 30 capsule, Rfl: 11

## 2020-12-07 NOTE — ED Provider Notes (Signed)
[name]   MRN: [id number] DOB: 01-26-1986  Subjective:   Laurie Fields is a 35 y.o. female presenting for 5-day history of acute onset persistent left-sided chest pain that radiates to the left arm and the left jaw.  Patient states she is concerned about having a heart attack but also reports a history of having left lower leg DVT while she was pregnant about 1 to 2 years ago.  She is not on long-term anticoagulation.  Denies history of heart disease, heart attack.  Denies family history of the same.  Patient is also felt intermittent shortness of breath and chest tightness.  She does have a history of asthma and has used oral prednisone courses the better part of her life since she was 35 years old.  She states that she is using albuterol both the inhaler and the nebulizer but not at the same time.  She also uses Pulmicort as needed.  Has not followed up with her PCP.  She did report to the emergency room on 12/05/2020 but left without being seen.  No current facility-administered medications for this encounter.  Current Outpatient Medications:  .  albuterol (PROVENTIL HFA;VENTOLIN HFA) 108 (90 Base) MCG/ACT inhaler, Inhale 2 puffs into the lungs every 4 (four) hours as needed for wheezing or shortness of breath., Disp: 1 Inhaler, Rfl: 2 .  albuterol (PROVENTIL) (2.5 MG/3ML) 0.083% nebulizer solution, Take 3 mLs (2.5 mg total) by nebulization every 6 (six) hours as needed for wheezing or shortness of breath., Disp: 75 mL, Rfl: 12 .  albuterol (VENTOLIN HFA) 108 (90 Base) MCG/ACT inhaler, Inhale 2 puffs into the lungs every 4 (four) hours as needed for wheezing or shortness of breath., Disp: 1 each, Rfl: 1 .  fluticasone (FLONASE) 50 MCG/ACT nasal spray, Place 2 sprays into both nostrils daily., Disp: 16 g, Rfl: 6 .  HYDROcodone-acetaminophen (NORCO) 5-325 MG tablet, Take 1 tablet by mouth every 4 (four) hours as needed for moderate pain., Disp: 10 tablet, Rfl: 0 .   phenazopyridine (PYRIDIUM) 200 MG tablet, Take 1 tablet (200 mg total) by mouth 3 (three) times daily as needed for pain., Disp: 10 tablet, Rfl: 0 .  predniSONE (DELTASONE) 20 MG tablet, Take 3 tablets (60 mg total) by mouth daily., Disp: 12 tablet, Rfl: 0 .  promethazine (PHENERGAN) 25 MG tablet, Take 1 tablet (25 mg total) by mouth every 6 (six) hours as needed for nausea or vomiting., Disp: 20 tablet, Rfl: 0 .  rizatriptan (MAXALT-MLT) 10 MG disintegrating tablet, Take 1 tablet (10 mg total) by mouth as needed. May repeat in 2 hours if needed, Disp: 12 tablet, Rfl: 11 .  Topiramate ER (TROKENDI XR) 200 MG CP24, Take 200 mg by mouth at bedtime., Disp: 30 capsule, Rfl: 11   Allergies  Allergen Reactions  . Serevent [Salmeterol] Hives, Nausea And Vomiting and Other (See Comments)    Hallucinations Pt states tolerates Atrovent well    Past Medical History:  Diagnosis Date  . Asthma    Hx intubation R/T asthma  . Blurred vision   . Depression   . Migraine   . Obesity   . Ovarian cyst   . Pseudotumor cerebri   . Vertigo      Past Surgical History:  Procedure Laterality Date  . ABDOMINAL HYSTERECTOMY    . carpel  tunnel release    . CESAREAN SECTION    . peritoneal cyst      Family History  Problem Relation Age  of Onset  . Hypertension Other   . Diabetes Other   . Cancer Other   . CAD Other   . Leukemia Other   . Graves' disease Mother   . Other Mother        low blood sugar  . Hypertension Father   . Diabetes Maternal Grandmother   . Colon polyps Maternal Grandmother   . Glaucoma Maternal Grandfather   . Cataracts Maternal Grandfather   . Prostate cancer Maternal Grandfather   . Diabetes Paternal Grandmother   . Cataracts Paternal Grandfather   . Prostate cancer Paternal Grandfather     Social History   Tobacco Use  . Smoking status: Never Smoker  . Smokeless tobacco: Never Used  Vaping Use  . Vaping Use: Never used  Substance Use Topics  . Alcohol use: No   . Drug use: No    ROS   Objective:   Vitals: BP 119/88   Pulse 76   Temp 98.3 F (36.8 C)   Resp 19   SpO2 99%   Physical Exam  ED ECG REPORT   Date: 12/07/2020  Rate: 75bpm  Rhythm: normal sinus rhythm  QRS Axis: normal  Intervals: normal  ST/T Wave abnormalities: Nonspecific T wave inversion in lead V3  Conduction Disutrbances:none  Narrative Interpretation: Sinus rhythm at 75 bpm with nonspecific T wave inversion, very comparable to EKG from 12/05/2020.  Old EKG Reviewed: unchanged  I have personally reviewed the EKG tracing and agree with the computerized printout as noted.  DG Chest 2 View  Result Date: 12/05/2020 CLINICAL DATA:  Chest pain and shortness of breath.  Weakness. EXAM: CHEST - 2 VIEW COMPARISON:  10/28/2020 FINDINGS: The cardiomediastinal contours are normal. The lungs are clear. Pulmonary vasculature is normal. No consolidation, pleural effusion, or pneumothorax. No acute osseous abnormalities are seen. IMPRESSION: Negative radiographs of the chest. Electronically Signed   By: Narda Rutherford M.D.   On: 12/05/2020 22:40   Results for orders placed or performed during the hospital encounter of 12/05/20 (from the past 72 hour(s))  Basic metabolic panel     Status: None   Collection Time: 12/05/20  9:57 PM  Result Value Ref Range   Sodium 140 135 - 145 mmol/L   Potassium 3.6 3.5 - 5.1 mmol/L   Chloride 105 98 - 111 mmol/L   CO2 25 22 - 32 mmol/L   Glucose, Bld 86 70 - 99 mg/dL    Comment: Glucose reference range applies only to samples taken after fasting for at least 8 hours.   BUN 9 6 - 20 mg/dL   Creatinine, Ser 1.60 0.44 - 1.00 mg/dL   Calcium 9.0 8.9 - 10.9 mg/dL   GFR, Estimated >32 >35 mL/min    Comment: (NOTE) Calculated using the CKD-EPI Creatinine Equation (2021)    Anion gap 10 5 - 15    Comment: Performed at [facility], [address], [place], [place] [address]  CBC     Status: Abnormal   Collection Time: 12/05/20  9:57  PM  Result Value Ref Range   WBC 8.2 4.0 - 10.5 K/uL   RBC 4.58 3.87 - 5.11 MIL/uL   Hemoglobin 13.1 12.0 - 15.0 g/dL   HCT 02.5 42.7 - 06.2 %   MCV 87.3 80.0 - 100.0 fL   MCH 28.6 26.0 - 34.0 pg   MCHC 32.8 30.0 - 36.0 g/dL   RDW 37.6 28.3 - 15.1 %   Platelets 434 (H) 150 - 400  K/uL   nRBC 0.0 0.0 - 0.2 %    Comment: Performed at [facility], [address], [place], [place] [address]  Troponin I (High Sensitivity)     Status: None   Collection Time: 12/05/20  9:57 PM  Result Value Ref Range   Troponin I (High Sensitivity) 4 <18 ng/L    Comment: (NOTE) Elevated high sensitivity troponin I (hsTnI) values and significant  changes across serial measurements may suggest ACS but many other  chronic and acute conditions are known to elevate hsTnI results.  Refer to the "Links" section for chest pain algorithms and additional  guidance. Performed at [facility], [address] [place], [place] [address]   I-Stat beta hCG blood, ED     Status: None   Collection Time: 12/05/20 10:02 PM  Result Value Ref Range   I-stat hCG, quantitative <5.0 <5 mIU/mL   Comment 3            Comment:   GEST. AGE      CONC.  (mIU/mL)   <=1 WEEK        5 - 50     2 WEEKS       50 - 500     3 WEEKS       100 - 10,000     4 WEEKS     1,000 - 30,000        FEMALE AND NON-PREGNANT FEMALE:     LESS THAN 5 mIU/mL   Troponin I (High Sensitivity)     Status: None   Collection Time: 12/06/20 12:34 AM  Result Value Ref Range   Troponin I (High Sensitivity) 4 <18 ng/L    Comment: (NOTE) Elevated high sensitivity troponin I (hsTnI) values and significant  changes across serial measurements may suggest ACS but many other  chronic and acute conditions are known to elevate hsTnI results.  Refer to the "Links" section for chest pain algorithms and additional  guidance. Performed at [facility], [address], [place], [place] [address]      Assessment and Plan :   PDMP not reviewed  this encounter.  1. Shortness of breath   2. History of DVT (deep vein thrombosis)   3. Atypical chest pain   4. Mild persistent asthma without complication     Discussed differential with patient of which the most problematic would be recurrent DVT or chest PE.  Patient verbalizes understanding and does not want to go to the emergency room for chest CT scan.  Her primary concern was heart attack.  Counseled that she had low risk factors for heart event but could absolutely be having a recurrent blood clot.  Patient verbalized understanding and is adamant that she would prefer to follow-up with her PCP.  She is requesting another prednisone course as she has had history of success with this given her asthma.  Patient verbalizes understanding the risks of an untreated PE.  We will offer her a prednisone course, refill her albuterol both inhaler and nebulizer. Strict ER precautions. Counseled patient on potential for adverse effects with medications prescribed today, patient verbalized understanding.    Wallis Bamberg, [place] 12/07/20 [phone]

## 2020-12-07 NOTE — ED Triage Notes (Signed)
Pt in with c/o left chest pain that radiates to left arm and left jaw that started on Friday  Pt has not had medication for sxs  Also c/o some sob

## 2021-01-26 ENCOUNTER — Other Ambulatory Visit: Payer: Self-pay

## 2021-01-26 ENCOUNTER — Encounter: Payer: Self-pay | Admitting: Plastic Surgery

## 2021-01-26 ENCOUNTER — Ambulatory Visit (INDEPENDENT_AMBULATORY_CARE_PROVIDER_SITE_OTHER): Payer: 59 | Admitting: Plastic Surgery

## 2021-01-26 VITALS — BP 119/76 | HR 72 | Ht 65.0 in | Wt 299.0 lb

## 2021-01-26 DIAGNOSIS — M793 Panniculitis, unspecified: Secondary | ICD-10-CM

## 2021-01-26 NOTE — Progress Notes (Signed)
Referring Provider Mammie Lorenzo, MD No address on file   CC:  Chief Complaint  Patient presents with  . Advice Only      Laurie Fields is an 35 y.o. female.  HPI: Patient presents to discuss body contouring procedures.  She has been bothered by excess weight for quite some time.  She is bothered by the skin dimpling of her legs and the overhanging abdominal skin.  She is also bothered by the weight of her upper arms.  She has researched a number of procedures in hopes and is hopeful that liposuction or body contouring can be done to improve her situation.  The topic of weight loss surgery has been brought up to her in the past but she has been resistant due to some poor outcomes and people that she knows and her own reservations about the procedure.  Allergies  Allergen Reactions  . Serevent [Salmeterol] Hives, Nausea And Vomiting and Other (See Comments)    Hallucinations Pt states tolerates Atrovent well    Outpatient Encounter Medications as of 01/26/2021  Medication Sig Note  . albuterol (PROVENTIL) (2.5 MG/3ML) 0.083% nebulizer solution Take 3 mLs (2.5 mg total) by nebulization every 6 (six) hours as needed for wheezing or shortness of breath.   Marland Kitchen albuterol (VENTOLIN HFA) 108 (90 Base) MCG/ACT inhaler Inhale 2 puffs into the lungs every 4 (four) hours as needed for wheezing or shortness of breath.   . fluticasone (FLONASE) 50 MCG/ACT nasal spray Place 2 sprays into both nostrils daily.   Marland Kitchen HYDROcodone-acetaminophen (NORCO) 5-325 MG tablet Take 1 tablet by mouth every 4 (four) hours as needed for moderate pain.   . phenazopyridine (PYRIDIUM) 200 MG tablet Take 1 tablet (200 mg total) by mouth 3 (three) times daily as needed for pain.   . predniSONE (DELTASONE) 50 MG tablet Take 1 tablet (50 mg total) by mouth daily with breakfast.   . promethazine (PHENERGAN) 25 MG tablet Take 1 tablet (25 mg total) by mouth every 6 (six) hours as needed for nausea or vomiting.   .  rizatriptan (MAXALT-MLT) 10 MG disintegrating tablet Take 1 tablet (10 mg total) by mouth as needed. May repeat in 2 hours if needed   . Topiramate ER (TROKENDI XR) 200 MG CP24 Take 200 mg by mouth at bedtime. 11/14/2018: [place] trokendi XR approved 11/24/17-11/25/18.     No facility-administered encounter medications on file as of 01/26/2021.     Past Medical History:  Diagnosis Date  . Asthma    Hx intubation R/T asthma  . Blurred vision   . Depression   . Migraine   . Obesity   . Ovarian cyst   . Pseudotumor cerebri   . Vertigo     Past Surgical History:  Procedure Laterality Date  . ABDOMINAL HYSTERECTOMY    . carpel  tunnel release    . CESAREAN SECTION    . peritoneal cyst      Family History  Problem Relation Age of Onset  . Hypertension Other   . Diabetes Other   . Cancer Other   . CAD Other   . Leukemia Other   . Graves' disease Mother   . Other Mother        low blood sugar  . Hypertension Father   . Diabetes Maternal Grandmother   . Colon polyps Maternal Grandmother   . Glaucoma Maternal Grandfather   . Cataracts Maternal Grandfather   . Prostate cancer Maternal Grandfather   . Diabetes Paternal  Grandmother   . Cataracts Paternal Grandfather   . Prostate cancer Paternal Grandfather     Social History   Social History Narrative   Lives at home with her children. She has four living sons. She had one daughter that passed away at age 57 with Aplastic anemia.   Right-handed.   2-3 cups of caffeine weekly.     Review of Systems General: Denies fevers, chills, weight loss CV: Denies chest pain, shortness of breath, palpitations  Physical Exam Vitals with BMI 01/26/2021 12/07/2020 12/06/2020  Height 5\' 5"  - -  Weight 299 lbs - -  BMI 49.76 - -  Systolic 119 119  Diastolic 76 88 71  Pulse 72 76 66    General:  No acute distress,  Alert and oriented, Non-Toxic, Normal speech and affect Examination of the abdomen: Abdomen feels soft and nontender.  She  has significant excess skin and adipose tissue.  I do not appreciate any obvious hernias.  She has a lower transverse scar that I can appreciate.  She does appear to have inflammatory changes in the infraumbilical crease. Examination of the upper arm shows significant neck excess skin and adipose tissue.  She does have a tethering crease about halfway up the upper arm which causes a contour irregularity.  There is no obvious scars in that area. Examination of the thighs show significant excess skin and adipose tissue along with some skin dimpling.  She has mild edema in the lower extremities below the knee.  Assessment/Plan Patient presents to discuss her options for body contouring procedures.  I explained that in my opinion a BMI of 53 puts her in an extremely high risk category.  I do not feel that panniculectomy or excision of excess skin would be a good idea under these conditions.  Additionally I did not feel that liposuction alone would do much benefit for either her arms or legs alone because there would be excess skin after the fact.  She is interested in seeking another opinion and I think that that would be a reasonable choice for her.  I offered to assist with helping with a referral to [facility] setting but she declined.  We will be happy to see her again on an as-needed basis.  381 01/26/2021, 5:31 PM

## 2021-03-21 ENCOUNTER — Other Ambulatory Visit (HOSPITAL_COMMUNITY)
Admission: RE | Admit: 2021-03-21 | Discharge: 2021-03-21 | Disposition: A | Discharge status: Home / Self Care | Payer: 59 | Source: Ambulatory Visit | Attending: Cardiovascular Disease | Admitting: Cardiovascular Disease

## 2021-03-21 DIAGNOSIS — Z01812 Encounter for preprocedural laboratory examination: Secondary | ICD-10-CM | POA: Insufficient documentation

## 2021-03-21 DIAGNOSIS — Z20822 Contact with and (suspected) exposure to covid-19: Secondary | ICD-10-CM | POA: Diagnosis not present

## 2021-03-22 ENCOUNTER — Other Ambulatory Visit: Payer: Self-pay | Admitting: Cardiovascular Disease

## 2021-03-22 DIAGNOSIS — I25810 Atherosclerosis of coronary artery bypass graft(s) without angina pectoris: Secondary | ICD-10-CM

## 2021-03-22 LAB — SARS CORONAVIRUS 2 (TAT 6-24 HRS): SARS Coronavirus 2: NEGATIVE

## 2021-03-23 ENCOUNTER — Inpatient Hospital Stay (HOSPITAL_COMMUNITY): Payer: 59

## 2021-03-23 ENCOUNTER — Other Ambulatory Visit: Payer: Self-pay

## 2021-03-23 ENCOUNTER — Emergency Department (HOSPITAL_COMMUNITY): Payer: 59

## 2021-03-23 ENCOUNTER — Inpatient Hospital Stay (HOSPITAL_COMMUNITY)
Admission: EM | Admit: 2021-03-23 | Discharge: 2021-03-27 | DRG: 880 | Disposition: A | Discharge status: Home Health | Payer: 59 | Source: Ambulatory Visit | Attending: Neurology | Admitting: Neurology

## 2021-03-23 ENCOUNTER — Ambulatory Visit (HOSPITAL_COMMUNITY): Admission: RE | Admit: 2021-03-23 | Payer: 59 | Source: Home / Self Care | Admitting: Cardiovascular Disease

## 2021-03-23 ENCOUNTER — Ambulatory Visit (HOSPITAL_COMMUNITY)
Admission: RE | Admit: 2021-03-23 | Discharge: 2021-03-23 | Disposition: A | Discharge status: Home / Self Care | Payer: 59 | Source: Ambulatory Visit | Attending: Cardiovascular Disease | Admitting: Cardiovascular Disease

## 2021-03-23 ENCOUNTER — Encounter (HOSPITAL_COMMUNITY): Payer: Self-pay | Admitting: Radiology

## 2021-03-23 ENCOUNTER — Encounter (HOSPITAL_COMMUNITY): Admission: RE | Payer: Self-pay | Source: Home / Self Care

## 2021-03-23 DIAGNOSIS — I639 Cerebral infarction, unspecified: Secondary | ICD-10-CM | POA: Diagnosis present

## 2021-03-23 DIAGNOSIS — R531 Weakness: Secondary | ICD-10-CM | POA: Diagnosis not present

## 2021-03-23 DIAGNOSIS — G8194 Hemiplegia, unspecified affecting left nondominant side: Secondary | ICD-10-CM | POA: Diagnosis present

## 2021-03-23 DIAGNOSIS — E119 Type 2 diabetes mellitus without complications: Secondary | ICD-10-CM | POA: Diagnosis present

## 2021-03-23 DIAGNOSIS — G43909 Migraine, unspecified, not intractable, without status migrainosus: Secondary | ICD-10-CM | POA: Diagnosis present

## 2021-03-23 DIAGNOSIS — Z8371 Family history of colonic polyps: Secondary | ICD-10-CM | POA: Diagnosis not present

## 2021-03-23 DIAGNOSIS — G932 Benign intracranial hypertension: Secondary | ICD-10-CM | POA: Diagnosis present

## 2021-03-23 DIAGNOSIS — Z888 Allergy status to other drugs, medicaments and biological substances status: Secondary | ICD-10-CM | POA: Diagnosis not present

## 2021-03-23 DIAGNOSIS — R4689 Other symptoms and signs involving appearance and behavior: Secondary | ICD-10-CM | POA: Diagnosis not present

## 2021-03-23 DIAGNOSIS — I6389 Other cerebral infarction: Secondary | ICD-10-CM | POA: Diagnosis not present

## 2021-03-23 DIAGNOSIS — R4701 Aphasia: Secondary | ICD-10-CM

## 2021-03-23 DIAGNOSIS — E785 Hyperlipidemia, unspecified: Secondary | ICD-10-CM | POA: Diagnosis present

## 2021-03-23 DIAGNOSIS — F32A Depression, unspecified: Secondary | ICD-10-CM | POA: Diagnosis present

## 2021-03-23 DIAGNOSIS — Z90710 Acquired absence of both cervix and uterus: Secondary | ICD-10-CM | POA: Diagnosis not present

## 2021-03-23 DIAGNOSIS — Z8249 Family history of ischemic heart disease and other diseases of the circulatory system: Secondary | ICD-10-CM

## 2021-03-23 DIAGNOSIS — F444 Conversion disorder with motor symptom or deficit: Secondary | ICD-10-CM | POA: Diagnosis present

## 2021-03-23 DIAGNOSIS — Z79899 Other long term (current) drug therapy: Secondary | ICD-10-CM | POA: Diagnosis not present

## 2021-03-23 DIAGNOSIS — I2510 Atherosclerotic heart disease of native coronary artery without angina pectoris: Secondary | ICD-10-CM | POA: Diagnosis present

## 2021-03-23 DIAGNOSIS — Z806 Family history of leukemia: Secondary | ICD-10-CM

## 2021-03-23 DIAGNOSIS — R253 Fasciculation: Secondary | ICD-10-CM | POA: Diagnosis present

## 2021-03-23 DIAGNOSIS — Z86718 Personal history of other venous thrombosis and embolism: Secondary | ICD-10-CM

## 2021-03-23 DIAGNOSIS — R0789 Other chest pain: Secondary | ICD-10-CM | POA: Diagnosis present

## 2021-03-23 DIAGNOSIS — F449 Dissociative and conversion disorder, unspecified: Secondary | ICD-10-CM | POA: Diagnosis not present

## 2021-03-23 DIAGNOSIS — Z6841 Body Mass Index (BMI) 40.0 and over, adult: Secondary | ICD-10-CM | POA: Diagnosis not present

## 2021-03-23 DIAGNOSIS — I25810 Atherosclerosis of coronary artery bypass graft(s) without angina pectoris: Secondary | ICD-10-CM | POA: Diagnosis present

## 2021-03-23 DIAGNOSIS — Z833 Family history of diabetes mellitus: Secondary | ICD-10-CM

## 2021-03-23 DIAGNOSIS — G514 Facial myokymia: Secondary | ICD-10-CM | POA: Diagnosis not present

## 2021-03-23 DIAGNOSIS — Z7952 Long term (current) use of systemic steroids: Secondary | ICD-10-CM | POA: Diagnosis not present

## 2021-03-23 DIAGNOSIS — G43109 Migraine with aura, not intractable, without status migrainosus: Secondary | ICD-10-CM | POA: Diagnosis not present

## 2021-03-23 DIAGNOSIS — Z9282 Status post administration of tPA (rtPA) in a different facility within the last 24 hours prior to admission to current facility: Secondary | ICD-10-CM | POA: Diagnosis not present

## 2021-03-23 LAB — I-STAT BETA HCG BLOOD, ED (MC, WL, AP ONLY): I-stat hCG, quantitative: <5 m[IU]/mL (ref <5)

## 2021-03-23 LAB — COMPREHENSIVE METABOLIC PANEL
ALT: 8 U/L (ref 0–44)
AST: 18 U/L (ref 15–41)
Albumin: 3.8 g/dL (ref 3.5–5.0)
Alkaline Phosphatase: 65 U/L (ref 38–126)
Anion gap: 8 (ref 5–15)
BUN: 13 mg/dL (ref 6–20)
CO2: 26 mmol/L (ref 22–32)
Calcium: 8.9 mg/dL (ref 8.9–10.3)
Chloride: 105 mmol/L (ref 98–111)
Creatinine, Ser: 0.78 mg/dL (ref 0.44–1.00)
GFR, Estimated: >60 mL/min (ref >60)
Glucose, Bld: 95 mg/dL (ref 70–99)
Potassium: 4 mmol/L (ref 3.5–5.1)
Sodium: 139 mmol/L (ref 135–145)
Total Bilirubin: 0.8 mg/dL (ref 0.3–1.2)
Total Protein: 7.6 g/dL (ref 6.5–8.1)

## 2021-03-23 LAB — CBC
HCT: 40.6 % (ref 36.0–46.0)
Hemoglobin: 13 g/dL (ref 12.0–15.0)
MCH: 28.4 pg (ref 26.0–34.0)
MCHC: 32 g/dL (ref 30.0–36.0)
MCV: 88.8 fL (ref 80.0–100.0)
Platelets: 361 10*3/uL (ref 150–400)
RBC: 4.57 MIL/uL (ref 3.87–5.11)
RDW: 13.8 % (ref 11.5–15.5)
WBC: 8.6 10*3/uL (ref 4.0–10.5)
nRBC: 0 % (ref 0.0–0.2)

## 2021-03-23 LAB — DIFFERENTIAL
Abs Immature Granulocytes: 0.04 10*3/uL (ref 0.00–0.07)
Basophils Absolute: 0 10*3/uL (ref 0.0–0.1)
Basophils Relative: 0 %
Eosinophils Absolute: 0.2 10*3/uL (ref 0.0–0.5)
Eosinophils Relative: 2 %
Immature Granulocytes: 1 %
Lymphocytes Relative: 20 %
Lymphs Abs: 1.7 10*3/uL (ref 0.7–4.0)
Monocytes Absolute: 0.4 10*3/uL (ref 0.1–1.0)
Monocytes Relative: 5 %
Neutro Abs: 6.3 10*3/uL (ref 1.7–7.7)
Neutrophils Relative %: 72 %

## 2021-03-23 LAB — I-STAT CHEM 8, ED
BUN: 15 mg/dL (ref 6–20)
Calcium, Ion: 1.13 mmol/L — ABNORMAL LOW (ref 1.15–1.40)
Chloride: 106 mmol/L (ref 98–111)
Creatinine, Ser: 0.7 mg/dL (ref 0.44–1.00)
Glucose, Bld: 93 mg/dL (ref 70–99)
HCT: 40 % (ref 36.0–46.0)
Hemoglobin: 13.6 g/dL (ref 12.0–15.0)
Potassium: 4.2 mmol/L (ref 3.5–5.1)
Sodium: 142 mmol/L (ref 135–145)
TCO2: 27 mmol/L (ref 22–32)

## 2021-03-23 LAB — APTT: aPTT: 26 seconds (ref 24–36)

## 2021-03-23 LAB — CBG MONITORING, ED: Glucose-Capillary: 74 mg/dL (ref 70–99)

## 2021-03-23 LAB — PROTIME-INR
INR: 1 (ref 0.8–1.2)
Prothrombin Time: 13 seconds (ref 11.4–15.2)

## 2021-03-23 SURGERY — LEFT HEART CATH AND CORONARY ANGIOGRAPHY
Anesthesia: LOCAL

## 2021-03-23 MED ORDER — TOPIRAMATE ER 100 MG PO SPRINKLE CAP24
200.0000 mg | EXTENDED_RELEASE_CAPSULE | Freq: Every day | ORAL | Status: DC
  Administered 2021-03-24 – 2021-03-26 (×3): 200 mg via ORAL
  Filled 2021-03-23 (×6): qty 2

## 2021-03-23 MED ORDER — PANTOPRAZOLE SODIUM 40 MG IV SOLR
40.0000 mg | Freq: Every day | INTRAVENOUS | Status: DC
  Administered 2021-03-23: 40 mg via INTRAVENOUS
  Filled 2021-03-23: qty 40

## 2021-03-23 MED ORDER — SODIUM CHLORIDE 0.9 % IV BOLUS
1000.0000 mL | Freq: Once | INTRAVENOUS | Status: AC
  Administered 2021-03-23: 1000 mL via INTRAVENOUS

## 2021-03-23 MED ORDER — SENNOSIDES-DOCUSATE SODIUM 8.6-50 MG PO TABS: 1.0000 | ORAL_TABLET | Freq: Every evening | ORAL | Status: DC | PRN

## 2021-03-23 MED ORDER — CLEVIDIPINE BUTYRATE 0.5 MG/ML IV EMUL: 0.0000 mg/h | INTRAVENOUS | Status: DC

## 2021-03-23 MED ORDER — REGADENOSON 0.4 MG/5ML IV SOLN
INTRAVENOUS | Status: AC
  Filled 2021-03-23: qty 5

## 2021-03-23 MED ORDER — ALTEPLASE (STROKE) FULL DOSE INFUSION
90.0000 mg | Freq: Once | INTRAVENOUS | Status: AC
  Administered 2021-03-23: 81 mg via INTRAVENOUS
  Filled 2021-03-23: qty 100

## 2021-03-23 MED ORDER — SODIUM CHLORIDE 0.9 % IV SOLN
50.0000 mL | Freq: Once | INTRAVENOUS | Status: AC
  Administered 2021-03-23: 50 mL via INTRAVENOUS

## 2021-03-23 MED ORDER — TECHNETIUM TC 99M TETROFOSMIN IV KIT
32.0000 | PACK | Freq: Once | INTRAVENOUS | Status: AC | PRN
  Administered 2021-03-23: 32 via INTRAVENOUS

## 2021-03-23 MED ORDER — SODIUM CHLORIDE 0.9 % IV SOLN: INTRAVENOUS | Status: DC

## 2021-03-23 MED ORDER — IOHEXOL 350 MG/ML SOLN
75.0000 mL | Freq: Once | INTRAVENOUS | Status: AC | PRN
  Administered 2021-03-23: 75 mL via INTRAVENOUS

## 2021-03-23 MED ORDER — ACETAMINOPHEN 650 MG RE SUPP: 650.0000 mg | RECTAL | Status: DC | PRN

## 2021-03-23 MED ORDER — CHLORHEXIDINE GLUCONATE CLOTH 2 % EX PADS
6.0000 | MEDICATED_PAD | Freq: Every day | CUTANEOUS | Status: DC
  Administered 2021-03-25 – 2021-03-27 (×2): 6 via TOPICAL

## 2021-03-23 MED ORDER — ACETAMINOPHEN 160 MG/5ML PO SOLN: 650.0000 mg | ORAL | Status: DC | PRN

## 2021-03-23 MED ORDER — REGADENOSON 0.4 MG/5ML IV SOLN
0.4000 mg | Freq: Once | INTRAVENOUS | Status: AC
  Administered 2021-03-23: 0.4 mg via INTRAVENOUS

## 2021-03-23 MED ORDER — TECHNETIUM TC 99M TETROFOSMIN IV KIT
11.0000 | PACK | Freq: Once | INTRAVENOUS | Status: AC | PRN
  Administered 2021-03-23: 11 via INTRAVENOUS

## 2021-03-23 MED ORDER — ACETAMINOPHEN 325 MG PO TABS
650.0000 mg | ORAL_TABLET | ORAL | Status: DC | PRN
  Administered 2021-03-24 – 2021-03-26 (×5): 650 mg via ORAL
  Filled 2021-03-23 (×6): qty 2

## 2021-03-23 MED ORDER — STROKE: EARLY STAGES OF RECOVERY BOOK
Freq: Once | Status: DC
  Filled 2021-03-23: qty 1

## 2021-03-23 MED ORDER — SODIUM CHLORIDE 0.9% FLUSH
3.0000 mL | Freq: Once | INTRAVENOUS | Status: DC
Start: 2021-03-23 — End: 2021-03-27

## 2021-03-23 NOTE — ED Notes (Signed)
Dr. Selina Cooley and Dr. Sharmaine Base at bedside.

## 2021-03-23 NOTE — H&P (Addendum)
NEUROLOGY H&P AND STROKE CODE CONSULTATION NOTE   Date of service: March 23, 2021 Patient Name: Laurie Fields MRN:  [id number] DOB:  [birth year] Reason for consult: stroke code for acute onset L sided weakness and aphasia, LKW 0906 today. _ _ _   _ __   _ __ _ _  __ __   _ __   __ _  History of Present Illness   Laurie Fields is a 35 y.o. female with PMH significant for  has a past medical history of Asthma, Blurred vision, Depression, Migraine, Obesity, Ovarian cyst, Pseudotumor cerebri, and Vertigo. who presents as stroke code from [place] for acute onset L sided weakness and aphasia during cardiac stress test.  She was at [facility] for an outpatient cardiac Lexiscan stress test.  Within 3 minutes of Lexiscan administration she became aphasic shaking of her left face and weakness of her left face, left arm, and left leg.  She had a right gaze preference but was able to cross midline during oculocephalics.  Head CT showed no acute intracranial process and aspects score was 10.  She had an NIH stroke scale of 15.  R gaze preference with L sided weakness suggested stroke etiology over seizure although the clinical picture was confounded 2/2 report of prior L facial twitching.  Due to aphasia patient did not have capacity to give informed consent.  I attempted to reach the mobile number for her brother Legrand Como that was listed in her patient contact and there is no answer.  I also contacted her home phone number listed and there was no answer there.  Due to patient's inability to give consent, inability to reach any family members, and her severe presentation tPA was considered at that time under emergency protocol for implied consent.  Given that patient was unable to provide consent chart review was performed prior to tPA decision.  Does have a history of DVT when she was pregnant a few years ago although she was not on anticoagulation as an outpatient prior to this  admission.  She does have a history of migraine headaches and in 2019 presented to the [facility] emergency department complaining of left-sided weakness.  That presentation was very different than today and that it was associated with her typical migraine.  She remained responsive and had no issues with cognition or speech at that time, and the onset was insidious over approximately 5 days.  MRI brain with and without contrast at that time was normal.  She did not have vascular imaging performed at that time.  She also has a history of pseudotumor cerebri.  No history of other presentation of focal neurodeficits in the last 5 years per chart review.  No known history of seizures per chart review.  Based on available information patient met inclusion criteria for tPA and did not meet any exclusion criteria. tPA was administered under emergency protocol for implied consent.  CTA was performed and showed no large vessel occlusion therefore no intervention was indicated.  Patient develops recurrent isolated twitching of the left side of her face during which she was lethargic but appropriately responsive.  Stat EEG was ordered and patient admitted to the ICU.    ROS   UTA 2/2 aphasia  Past History   Past Medical History:  Diagnosis Date  . Asthma    Hx intubation R/T asthma  . Blurred vision   . Depression   . Migraine   . Obesity   . Ovarian cyst   .  Pseudotumor cerebri   . Vertigo    Past Surgical History:  Procedure Laterality Date  . ABDOMINAL HYSTERECTOMY    . carpel  tunnel release    . CESAREAN SECTION    . peritoneal cyst     Family History  Problem Relation Age of Onset  . Hypertension Other   . Diabetes Other   . Cancer Other   . CAD Other   . Leukemia Other   . Graves' disease Mother   . Other Mother        low blood sugar  . Hypertension Father   . Diabetes Maternal Grandmother   . Colon polyps Maternal Grandmother   . Glaucoma Maternal Grandfather   . Cataracts  Maternal Grandfather   . Prostate cancer Maternal Grandfather   . Diabetes Paternal Grandmother   . Cataracts Paternal Grandfather   . Prostate cancer Paternal Grandfather    Social History   Socioeconomic History  . Marital status: Single    Spouse name: Not on file  . Number of children: 4  . Years of education: college  . Highest education level: Not on file  Occupational History  . Occupation: Retail  Tobacco Use  . Smoking status: Never Smoker  . Smokeless tobacco: Never Used  Vaping Use  . Vaping Use: Never used  Substance and Sexual Activity  . Alcohol use: No  . Drug use: No  . Sexual activity: Not on file  Other Topics Concern  . Not on file  Social History Narrative   Lives at home with her children. She has four living sons. She had one daughter that passed away at age 15 with Aplastic anemia.   Right-handed.   2-3 cups of caffeine weekly.   Social Determinants of Health   Financial Resource Strain: Not on file  Food Insecurity: Not on file  Transportation Needs: Not on file  Physical Activity: Not on file  Stress: Not on file  Social Connections: Not on file   Allergies  Allergen Reactions  . Serevent [Salmeterol] Hives, Nausea And Vomiting and Other (See Comments)    Hallucinations Pt states tolerates Atrovent well    Medications   (Not in a hospital admission)    Vitals   Vitals:   03/23/21 1145 03/23/21 1200 03/23/21 1215 03/23/21 1230  BP: 95/77 120/83 116/86 121/84  Pulse: 62 (!) 58 (!) 56 68  Resp: '13 13 14 15  ' SpO2: 100% 100% 97% 100%  Weight:      Height:         Body mass index is 48.79 kg/m.  Physical Exam   Physical Exam Gen: alert, able to answer first name but unable to answer other orientation questions 2/2 aphasia HEENT: Atraumatic, normocephalic Neck: Supple, trachea midline. Resp: CTAB CV: RRR Abd: soft/NT/ND Extrem: Nml bulk; no cyanosis, clubbing, or edema.  Neuro: *MS: alert, able to answer first name but  unable to answer other orientation questions 2/2 aphasia. Able to follow simple commands but not multi-step. *Speech: mild dysarthria, marked expressive>receptive aphasia. This fluctuated over the course of the stroke code, and at times she was able to name some objects, but not reproducibly *CN: PERRLA, blinks to threat bilat, R gaze preference, able to cross midline w/ oculocephalics, sensation intact from V1 to V3 to LT. Eyelid closure was full.  L UMN facial droop. Hearing intact to voice *Motor:   Normal bulk.  RUE no drift. LUE some effort against gravity. RLE drift but not down to bed. No  movement LLE. Sensory: withdraws to noxious stimuli less briskly on the L than the R. No obvious extinction.  *Coordination, gait: UTA 2/2 weakness and comprehension *Reflexes:  2+ and symmetric throughout without clonus; toes down-going bilat  NIHSS  1a Level of Conscious.: 0 1b LOC Questions: 1 1c LOC Commands: 1 2 Best Gaze: 1 3 Visual: 0 4 Facial Palsy: 1 5a Motor Arm - left: 2 5b Motor Arm - Right: 0 6a Motor Leg - Left: 4 6b Motor Leg - Right: 1 7 Limb Ataxia: 0 8 Sensory: 1 9 Best Language: 2 10 Dysarthria: 1 11 Extinct. and Inatten.: 0  TOTAL: 15  Premorbid mRS = 0    Labs   CBC:  Recent Labs  Lab 03/23/21 0920 03/23/21 0949  WBC 8.6  --   NEUTROABS 6.3  --   HGB 13.0 13.6  HCT 40.6 40.0  MCV 88.8  --   PLT 361  --     Basic Metabolic Panel:  Lab Results  Component Value Date   NA 142 03/23/2021   K 4.2 03/23/2021   CO2 26 03/23/2021   GLUCOSE 93 03/23/2021   BUN 15 03/23/2021   CREATININE 0.70 03/23/2021   CALCIUM 8.9 03/23/2021   GFRNONAA >60 03/23/2021   GFRAA >60 11/08/2018   Lipid Panel: No results found for: LDLCALC HgbA1c: No results found for: HGBA1C Urine Drug Screen: No results found for: LABOPIA, COCAINSCRNUR, LABBENZ, AMPHETMU, THCU, LABBARB  Alcohol Level No results found for: [place]   Impression   35 yo woman with hx IIH, migraine, remote  hx DVT not on anticoagulation p/w acute onset L sided weakness and aphasia, LKW 0906 today. tPA was administered based on emergency protocol for implied consent, see above discussion. CTA showed no LVO. Patient admitted to ICU for post-thrombolytic monitoring and stroke w/u. Of note after tPA was administered I returned to her room and while we were talking she developed irregular twitching of her L face without alteration of consciousness that abruptly started and stopped intermittently that in my estimation appeared more volitional than epileptic. I ordered STAT EEG for characterization but did not administer AEDs at that time due to lower suspicion for epileptic etiology.  Recommendations   # Acute neurologic deficits L face/arm/leg weakness + aphasia - Admit to [facility] ICU under Dr. Quinn Axe - Routine post Thrombolytic monitoring including neuro checks and blood pressure control during/after treatment Monitor blood pressure Check blood pressure and neuro assessment every 15 min for 2 h, then every 30 min for 6 h, and finally every hour for 16 h. - Manage Blood Pressure per post Thrombolytic protocol. Avoid oral antihypertensives - CT brain 24 hours post Thrombolytic - NPO until swallowing screen performed and passed - No antiplatelet agents or anticoagulants (including heparin for DVT prophylaxis) in first 24 hours. Stroke team to consider antiplatelet initiation on day 2 after 24 hr head CT showing no blood. - No Foley catheter, nasogastric tube, arterial catheter or central venous catheter for 24 hr, unless absolutely necessary - Telemetry - MRI brain wwo - TTE w/ bubble - Check A1c and LDL - Atorvastatin 56m daily - q4 hr neuro checks - STAT head CT for any change in neuro exam - PT/OT/SLP - Stroke education - Amb referral to neurology upon discharge  # L face twitching # Acute neuro deficits - STAT EEG. If epileptiform abnl will consider LTM + AED initiation  # Migraine - Continue  trokendi XR [id number] after she passes swallow eval  Stroke team will assume care tomorrow.  This patient is critically ill and at significant risk of neurological worsening, death and care requires constant monitoring of vital signs, hemodynamics,respiratory and cardiac monitoring, neurological assessment, discussion with family, other specialists and medical decision making of high complexity. I spent 90 minutes of neurocritical care time  in the care of  this patient. This was time spent independent of any time provided by nurse practitioner or PA.  Su Monks, MD [facility] [phone]  If 7pm- 7am, please page neurology on call as listed in [place].

## 2021-03-23 NOTE — ED Notes (Signed)
The patient has been transported to MRI on the monitor. Report given to 4N.

## 2021-03-23 NOTE — ED Notes (Signed)
Per MRI there were machine complications. The patient is in process of being scanned now. 4N RN Zella Ball notified of the delay in transport up. Pt continues to be stable. ER Charge also notified.

## 2021-03-23 NOTE — ED Notes (Signed)
Patient is unable to consent or sign the e-signuture

## 2021-03-23 NOTE — Code Documentation (Addendum)
Patient was at [facility] for an outpatient cardiac Lexiscan Stress Test. When the medication was started at 0906 the patient became aphasic, had a right facial droop, weakness and a gaze preference to the right. It was also noted that she had some left facial twitching. Radiology called [facility] to let me know they were heading to the ED bridge and calling a code stroke. The code was paged out and the team met the patient in the ED. Pt was cleared for a CT. She was taken for CT and CTA after a second PIV was established. Results below. Pt's NIHSS 15 (see stroke documentation). Pt is not on blood thinners and does not have any contraindications for TPA. Pt's BP 127/94 before TPA started at 0942.  Pt had also complained of chest pain and a headache on arrival. She says she has a hx of HTN, CVA (2012), and pseudotumor cerebri. Care Plan: Frequent neuro checks/vitals according to stroke order set, EEG, admit to Neuro ICU. 4N charge RN aware. Hand off with Brandi RN.   "IMPRESSION:  1. Normal head CT. 2. ASPECTS is 10.  1. Intracranially, no large vessel occlusion or proximal hemodynamically significant stenosis. 2. In the neck, no evidence of significant stenosis within the limitations detailed above. 3. Prominence of the tonsils and adenoids with resulting narrowed oropharyngeal airway, most likely related to hypertrophy. Recommend correlation with direct inspection. Findings could predispose to obstructive sleep apnea."   Doyle Tegethoff, Rande Brunt, RN  Stroke Response Nurse

## 2021-03-23 NOTE — ED Notes (Signed)
Patient is able to speak on the phone with her significant other.

## 2021-03-23 NOTE — ED Notes (Signed)
ED Provider at bedside. 

## 2021-03-23 NOTE — Progress Notes (Signed)
Pharmacist Code Stroke Response  Notified to mix tPA at 0936 by Dr. Selina Cooley Delivered tPA to RN at 0940  tPA dose = 9mg  bolus over 1 minute followed by 81mg  for a total dose of 90mg  over 1 hour  Issues/delays encountered (if applicable): N/A  Annagrace Carr, 03/23/21 9:44 AM

## 2021-03-23 NOTE — Progress Notes (Signed)
Patient in nuclear med department for outpatient Lexiscan stress test. Patient alert/oriented x 4 without any visible or notable neurological deficits prior to stress test.   Time out completed and Lexiscan administered at 902 for stress stest. Two minutes after administration of medication patient complained of shortness of breath and left sided chest pain 7/10. Around 0906 patient stated her head was hurting and felt like her face was twitching. Patient left side of face rapidly twitching followed by a left side facial droop. Patient assessed immediately and noted to have left sided extremity weakness (upper & lower). Code stroke called and patient taken to ED bridge by radiology nurses and Dr. Algie Coffer. In the emergency department patient continues to experience left sided facial droop and weakness in addition to blurred vision. Code stroke team at bedside and patient taken to CT for head scan. Report given to code stroke team nurse/NP/neurologist and receiving emergency room RN, Brandi L.   All of patient belongings transported with her to emergency room.

## 2021-03-23 NOTE — Progress Notes (Signed)
EEG complete - results pending 

## 2021-03-23 NOTE — ED Notes (Signed)
EEG at bedside.

## 2021-03-23 NOTE — Progress Notes (Signed)
PT Cancellation Note  Patient Details Name: Laurie Fields MRN: [id number] DOB: 01-Feb-1986   Cancelled Treatment:    Reason Eval/Treat Not Completed: Active bedrest order. Will follow-up as appropriate another day.   Raymond Gurney, PT, DPT Acute Rehabilitation Services  Pager: [phone] Office: [phone]    Jewel Baize 03/23/2021, 5:16 PM

## 2021-03-23 NOTE — Progress Notes (Signed)
Bedside [place] Swallow Screen failed r/t pt not able to drink most of the water without stopping. She did not want to attempt again. Mertie Haslem, [place] Scrape, RN

## 2021-03-23 NOTE — Consult Note (Signed)
Referring Physician: Alphonsus Sias, MD  Laurie Fields is an 35 y.o. female.                       Chief Complaint: Chest pain and transient ischemic attack.  Seen at 10 AM, 11 AM and periodically for 6 hours.  HPI: 35 years old female with PMH of Asthma, Blurred vision, obesity and possible pseudotumor cerebri had finished Lexiscan Myoview stress test without significant EKG changes of ischemia this morning. She started twitching left side of face and developed left sided weakness progressing to flaccidity. Code stroke was called. Per neurology she had CT of head, CT angiography of head and neck followed by TPA infusion and then MRI of brain as well as EEG. All her tests are unremarkable so far.   Her NM myocardial perfusion test is abnormal. Further cardiac intervention will be after patient is stable from current neurologic event. In 6 hours patient has mild improvement in vision and speech. She is barely moving tips of fingers and toes on left side.  Past Medical History:  Diagnosis Date  . Asthma    Hx intubation R/T asthma  . Blurred vision   . Depression   . Migraine   . Obesity   . Ovarian cyst   . Pseudotumor cerebri   . Vertigo       Past Surgical History:  Procedure Laterality Date  . ABDOMINAL HYSTERECTOMY    . carpel  tunnel release    . CESAREAN SECTION    . peritoneal cyst      Family History  Problem Relation Age of Onset  . Hypertension Other   . Diabetes Other   . Cancer Other   . CAD Other   . Leukemia Other   . Graves' disease Mother   . Other Mother        low blood sugar  . Hypertension Father   . Diabetes Maternal Grandmother   . Colon polyps Maternal Grandmother   . Glaucoma Maternal Grandfather   . Cataracts Maternal Grandfather   . Prostate cancer Maternal Grandfather   . Diabetes Paternal Grandmother   . Cataracts Paternal Grandfather   . Prostate cancer Paternal Grandfather    Social History:  reports that she has never smoked.  She has never used smokeless tobacco. She reports that she does not drink alcohol and does not use drugs.  Allergies:  Allergies  Allergen Reactions  . Serevent [Salmeterol] Hives, Nausea And Vomiting and Other (See Comments)    Hallucinations Pt states tolerates Atrovent well    Medications Prior to Admission  Medication Sig Dispense Refill  . albuterol (PROVENTIL) (2.5 MG/3ML) 0.083% nebulizer solution Take 3 mLs (2.5 mg total) by nebulization every 6 (six) hours as needed for wheezing or shortness of breath. 75 mL 0  . albuterol (VENTOLIN HFA) 108 (90 Base) MCG/ACT inhaler Inhale 2 puffs into the lungs every 4 (four) hours as needed for wheezing or shortness of breath. 18 g 0    Results for orders placed or performed during the hospital encounter of 03/23/21 (from the past 48 hour(s))  Protime-INR     Status: None   Collection Time: 03/23/21  9:20 AM  Result Value Ref Range   Prothrombin Time 13.0 11.4 - 15.2 seconds   INR 1.0 0.8 - 1.2    Comment: (NOTE) INR goal varies based on device and disease states. Performed at [facility], [address] [place], [place] [address]  APTT     Status: None   Collection Time: 03/23/21  9:20 AM  Result Value Ref Range   aPTT 26 24 - 36 seconds    Comment: Performed at [facility], [address], [place], [place] [address]  CBC     Status: None   Collection Time: 03/23/21  9:20 AM  Result Value Ref Range   WBC 8.6 4.0 - 10.5 K/uL   RBC 4.57 3.87 - 5.11 MIL/uL   Hemoglobin 13.0 12.0 - 15.0 g/dL   HCT 95.2 84.1 - 32.4 %   MCV 88.8 80.0 - 100.0 fL   MCH 28.4 26.0 - 34.0 pg   MCHC 32.0 30.0 - 36.0 g/dL   RDW 40.1 02.7 - 25.3 %   Platelets 361 150 - 400 K/uL   nRBC 0.0 0.0 - 0.2 %    Comment: Performed at [facility], [address] [place], [place] [address]  Differential     Status: None   Collection Time: 03/23/21  9:20 AM  Result Value Ref Range   Neutrophils Relative % 72 %   Neutro Abs 6.3 1.7 - 7.7  K/uL   Lymphocytes Relative 20 %   Lymphs Abs 1.7 0.7 - 4.0 K/uL   Monocytes Relative 5 %   Monocytes Absolute 0.4 0.1 - 1.0 K/uL   Eosinophils Relative 2 %   Eosinophils Absolute 0.2 0.0 - 0.5 K/uL   Basophils Relative 0 %   Basophils Absolute 0.0 0.0 - 0.1 K/uL   Immature Granulocytes 1 %   Abs Immature Granulocytes 0.04 0.00 - 0.07 K/uL    Comment: Performed at [facility], [address] [place], [place] [address]  Comprehensive metabolic panel     Status: None   Collection Time: 03/23/21  9:20 AM  Result Value Ref Range   Sodium 139 135 - 145 mmol/L   Potassium 4.0 3.5 - 5.1 mmol/L   Chloride 105 98 - 111 mmol/L   CO2 26 22 - 32 mmol/L   Glucose, Bld 95 70 - 99 mg/dL    Comment: Glucose reference range applies only to samples taken after fasting for at least 8 hours.   BUN 13 6 - 20 mg/dL   Creatinine, Ser 5.95 0.44 - 1.00 mg/dL   Calcium 8.9 8.9 - 63.8 mg/dL   Total Protein 7.6 6.5 - 8.1 g/dL   Albumin 3.8 3.5 - 5.0 g/dL   AST 18 15 - 41 U/L   ALT 8 0 - 44 U/L   Alkaline Phosphatase 65 38 - 126 U/L   Total Bilirubin 0.8 0.3 - 1.2 mg/dL   GFR, Estimated >75 >64 mL/min    Comment: (NOTE) Calculated using the CKD-EPI Creatinine Equation (2021)    Anion gap 8 5 - 15    Comment: Performed at [facility], [address], [place], [place] [address]  CBG monitoring, ED     Status: None   Collection Time: 03/23/21  9:39 AM  Result Value Ref Range   Glucose-Capillary 74 70 - 99 mg/dL    Comment: Glucose reference range applies only to samples taken after fasting for at least 8 hours.  I-Stat beta hCG blood, ED     Status: None   Collection Time: 03/23/21  9:47 AM  Result Value Ref Range   I-stat hCG, quantitative <5.0 <5 mIU/mL   Comment 3            Comment:   GEST.  AGE      CONC.  (mIU/mL)   <=1 WEEK        5 - 50     2 WEEKS       50 - 500     3 WEEKS       100 - 10,000     4 WEEKS     1,000 - 30,000        FEMALE AND NON-PREGNANT FEMALE:     LESS THAN  5 mIU/mL   I-stat chem 8, ED     Status: Abnormal   Collection Time: 03/23/21  9:49 AM  Result Value Ref Range   Sodium 142 135 - 145 mmol/L   Potassium 4.2 3.5 - 5.1 mmol/L   Chloride 106 98 - 111 mmol/L   BUN 15 6 - 20 mg/dL   Creatinine, Ser 1.610.70 0.44 - 1.00 mg/dL   Glucose, Bld 93 70 - 99 mg/dL    Comment: Glucose reference range applies only to samples taken after fasting for at least 8 hours.   Calcium, Ion 1.13 (L) 1.15 - 1.40 mmol/L   TCO2 27 22 - 32 mmol/L   Hemoglobin 13.6 12.0 - 15.0 g/dL   HCT 09.640.0 04.536.0 - 40.946.0 %   MR BRAIN WO CONTRAST  Result Date: 03/23/2021 CLINICAL DATA:  Stroke follow-up. EXAM: MRI HEAD WITHOUT CONTRAST TECHNIQUE: Multiplanar, multiecho pulse sequences of the brain and surrounding structures were obtained without intravenous contrast. COMPARISON:  Head CT March 23, 2021. FINDINGS: Brain: No acute infarction, hemorrhage, hydrocephalus, extra-axial collection or mass lesion. Vascular: Normal flow voids. Skull and upper cervical spine: Normal marrow signal. Sinuses/Orbits: Partially obscured by susceptibility artifact. Other: None. IMPRESSION: No acute intracranial abnormality identified. Unremarkable MRI of the brain. Electronically Signed   By: Baldemar LenisKatyucia  De Macedo Rodrigues M.D.   On: 03/23/2021 15:50   NM Myocar Multi W/Spect W/Wall Motion / EF  Result Date: 03/23/2021 CLINICAL DATA:  Chest pain since January 2022. Abnormal stress test and EKG. Stent. Asthma. EXAM: MYOCARDIAL IMAGING WITH SPECT (REST AND PHARMACOLOGIC-STRESS) GATED LEFT VENTRICULAR WALL MOTION STUDY LEFT VENTRICULAR EJECTION FRACTION TECHNIQUE: Standard myocardial SPECT imaging was performed after resting intravenous injection of 11 mCi Tc-4011m tetrofosmin. Subsequently, intravenous infusion of Lexiscan was performed under the supervision of the Cardiology staff. At peak effect of the drug, 30 mCi Tc-2411m tetrofosmin was injected intravenously and standard myocardial SPECT imaging was performed.  Quantitative gated imaging was also performed to evaluate left ventricular wall motion, and estimate left ventricular ejection fraction. COMPARISON:  None. FINDINGS: Perfusion: Moderate-sized region of reduced activity at the base of the muscular septal wall on stress images compared to rest images, compatible with moderate inducible ischemic defect. Apical thinning/scarring noted, but with a greater amount of reduction in activity along the lateral apex on stress images compared to rest images, compatible with a small inducible defect. Wall Motion: Mild hypokinesis at the apex. No left ventricular dilatation. Left Ventricular Ejection Fraction: 52 % End diastolic volume 91 ml End systolic volume 44 ml IMPRESSION: 1. Moderate inducible defect in the basilar portion of the muscular septum on stress images compared to rest images, compatible with moderate inducible ischemia. Apical thinning or scarring, with a small amount of inducible ischemia along the lateral apex. 2. Mild apical hypokinesis. 3. Left ventricular ejection fraction 52% 4. Non invasive risk stratification*: Intermediate *2012 Appropriate Use Criteria for Coronary Revascularization Focused Update: J Am Coll Cardiol. 2012;59(9):857-881. http://content.dementiazones.comonlinejacc.org/article.aspx?articleid=1201161 Electronically Signed   By: Annitta NeedsWalter  Liebkemann M.D.  On: 03/23/2021 15:51   EEG adult  Result Date: 03/23/2021 Charlsie Quest, MD     03/23/2021 12:35 PM Patient Name: Merlina Marchena MRN: [id number] Epilepsy Attending: Charlsie Quest Referring Physician/Provider: Dr Bing Neighbors Date: 03/23/2021 Duration: 23.27 mins Patient history: 35 year old female with left facial twitching.  EEG to evaluate for seizures. Level of alertness: awake, asleep AEDs during EEG study: None Technical aspects: This EEG study was done with scalp electrodes positioned according to the 10-20 International system of electrode placement. Electrical activity was acquired at  a sampling rate of  and reviewed with a high frequency filter of  and a low frequency filter of . EEG data were recorded continuously and digitally stored. Description: The posterior dominant rhythm consists of 8-9 Hz activity of moderate voltage (25-35 uV) seen predominantly in posterior head regions, symmetric and reactive to eye opening and eye closing. Sleep was characterized by vertex waves, sleep spindles (12 to 14 Hz), maximal frontocentral region. Hyperventilation and photic stimulation were not performed.   IMPRESSION: This study is within normal limits. No seizures or epileptiform discharges were seen throughout the recording. Charlsie Quest   CT HEAD CODE STROKE WO CONTRAST  Result Date: 03/23/2021 CLINICAL DATA:  Code stroke.  Left-sided weakness.  Aphasia. EXAM: CT HEAD WITHOUT CONTRAST TECHNIQUE: Contiguous axial images were obtained from the base of the skull through the vertex without intravenous contrast. COMPARISON:  11/08/2018 FINDINGS: Brain: Normal appearance without evidence of old or acute infarction, mass lesion, hemorrhage, hydrocephalus or extra-axial collection. Vascular: No abnormal vascular finding. Skull: Normal Sinuses/Orbits: Sinuses are clear. Old medial wall blowout fracture of the left orbit. Other: None ASPECTS (Alberta Stroke Program Early CT Score) - Ganglionic level infarction (caudate, lentiform nuclei, internal capsule, insula, M1-M3 cortex): 7 - Supraganglionic infarction (M4-M6 cortex): 3 Total score (0-10 with 10 being normal): 10 IMPRESSION: 1. Normal head CT. 2. ASPECTS is 10. 3. These results were communicated to Dr. Selina Cooley At 9:36 amon 4/28/2022by text page via the [facility] messaging system. Electronically Signed   By: Paulina Fusi M.D.   On: 03/23/2021 09:36   CT ANGIO HEAD CODE STROKE  Result Date: 03/23/2021 CLINICAL DATA:  Neuro deficit, acute stroke suspected. Left-sided weakness and aphasia. EXAM: CT ANGIOGRAPHY HEAD AND NECK TECHNIQUE:  Multidetector CT imaging of the head and neck was performed using the standard protocol during bolus administration of intravenous contrast. Multiplanar CT image reconstructions and MIPs were obtained to evaluate the vascular anatomy. Carotid stenosis measurements (when applicable) are obtained utilizing NASCET criteria, using the distal internal carotid diameter as the denominator. CONTRAST:  75mL OMNIPAQUE IOHEXOL 350 MG/ML SOLN COMPARISON:  Same day CT head. FINDINGS: CTA NECK FINDINGS Aortic arch: Great vessel origins are patent. Right carotid system: No evidence of dissection, stenosis (50% or greater) or occlusion. Left carotid system: No evidence of dissection, stenosis (50% or greater) or occlusion. Streak artifact limits evaluation of the common carotid artery origin. Vertebral arteries: Co dominant. No evidence of significant stenosis. The left vertebral artery origin is largely obscured by streak artifact and therefore not well evaluated Skeleton: No acute abnormality on limited assessment. Other neck: No acute abnormality. Prominence of the tonsils and adenoids with resulting narrowed oropharyngeal airway. Upper chest: Visualized lung apices are clear. Review of the MIP images confirms the above findings CTA HEAD FINDINGS Anterior circulation: No large vessel occlusion or proximal hemodynamically significant stenosis. No aneurysm. Posterior circulation: No large vessel occlusion or proximal hemodynamically significant stenosis. Bilateral posterior communicating arteries. No  aneurysm. Venous sinuses: As permitted by contrast timing, patent. Small left transverse sinus. Review of the MIP images confirms the above findings IMPRESSION: 1. Intracranially, no large vessel occlusion or proximal hemodynamically significant stenosis. 2. In the neck, no evidence of significant stenosis within the limitations detailed above. 3. Prominence of the tonsils and adenoids with resulting narrowed oropharyngeal airway,  most likely related to hypertrophy. Recommend correlation with direct inspection. Findings could predispose to obstructive sleep apnea. Electronically Signed   By: Feliberto Harts MD   On: 03/23/2021 10:21   CT ANGIO NECK CODE STROKE  Result Date: 03/23/2021 CLINICAL DATA:  Neuro deficit, acute stroke suspected. Left-sided weakness and aphasia. EXAM: CT ANGIOGRAPHY HEAD AND NECK TECHNIQUE: Multidetector CT imaging of the head and neck was performed using the standard protocol during bolus administration of intravenous contrast. Multiplanar CT image reconstructions and MIPs were obtained to evaluate the vascular anatomy. Carotid stenosis measurements (when applicable) are obtained utilizing NASCET criteria, using the distal internal carotid diameter as the denominator. CONTRAST:  35mL OMNIPAQUE IOHEXOL 350 MG/ML SOLN COMPARISON:  Same day CT head. FINDINGS: CTA NECK FINDINGS Aortic arch: Great vessel origins are patent. Right carotid system: No evidence of dissection, stenosis (50% or greater) or occlusion. Left carotid system: No evidence of dissection, stenosis (50% or greater) or occlusion. Streak artifact limits evaluation of the common carotid artery origin. Vertebral arteries: Co dominant. No evidence of significant stenosis. The left vertebral artery origin is largely obscured by streak artifact and therefore not well evaluated Skeleton: No acute abnormality on limited assessment. Other neck: No acute abnormality. Prominence of the tonsils and adenoids with resulting narrowed oropharyngeal airway. Upper chest: Visualized lung apices are clear. Review of the MIP images confirms the above findings CTA HEAD FINDINGS Anterior circulation: No large vessel occlusion or proximal hemodynamically significant stenosis. No aneurysm. Posterior circulation: No large vessel occlusion or proximal hemodynamically significant stenosis. Bilateral posterior communicating arteries. No aneurysm. Venous sinuses: As permitted  by contrast timing, patent. Small left transverse sinus. Review of the MIP images confirms the above findings IMPRESSION: 1. Intracranially, no large vessel occlusion or proximal hemodynamically significant stenosis. 2. In the neck, no evidence of significant stenosis within the limitations detailed above. 3. Prominence of the tonsils and adenoids with resulting narrowed oropharyngeal airway, most likely related to hypertrophy. Recommend correlation with direct inspection. Findings could predispose to obstructive sleep apnea. Electronically Signed   By: Feliberto Harts MD   On: 03/23/2021 10:21    Review Of Systems Constitutional: No fever, chills, Chronic weight gain. Eyes: No vision change, wears glasses. No discharge or pain. Ears: No hearing loss, No tinnitus. Respiratory: Positive asthma, COPD, pneumonias. Positive shortness of breath. No hemoptysis. Cardiovascular: Positive chest pain, no palpitation, leg edema. Gastrointestinal: No nausea, vomiting, diarrhea, constipation. No GI bleed. No hepatitis. Genitourinary: No dysuria, hematuria, kidney stone. No incontinance. Neurological: Positive headache, h/o stroke, no seizures.  Psychiatry: No psych facility admission for anxiety, depression, suicide. No detox. Skin: No rash. Musculoskeletal: Positive joint pain, fibromyalgia. No neck pain, back pain. Lymphadenopathy: No lymphadenopathy. Hematology: No anemia or easy bruising.   Blood pressure 121/84, pulse 68, resp. rate 15, height 5\' 5"  (1.651 m), weight 133 kg, SpO2 100 %. Body mass index is 48.79 kg/m. General appearance: alert, cooperative, appears stated age and no distress Head: Normocephalic, atraumatic. Eyes: Brown eyes, pink conjunctiva, corneas clear. PERRL, EOM's intact. Neck: No adenopathy, no carotid bruit, no JVD, supple, symmetrical, trachea midline and thyroid not enlarged. Resp: Clear to  auscultation bilaterally. Cardio: Regular rate and rhythm, S1, S2 normal, II/VI  systolic murmur, no click, rub or gallop GI: Soft, non-tender; bowel sounds normal; no organomegaly. Extremities: No edema, cyanosis or clubbing. Skin: Warm and dry.  Neurologic: Alert and oriented X 2, soft speech, significant left sided weakness. No papilledema on limited fundoscopy in AM prior to TPA infusion..  Assessment/Plan Transient ischemic attack/Stroke Chest pain CAD Morbid obesity Asthma H/O pseudotumor cerebri H/O migraine headache  Follow with neurology. Postpone cardiac intervention for now. Awaiting echocardiogram.   Time spent: Review of old records, Lab, x-rays, EKG, other cardiac tests, examination, discussion with patient/Nurse/Neurology over 70 minutes.  Ricki Rodriguez, MD  03/23/2021, 5:47 PM

## 2021-03-23 NOTE — Procedures (Signed)
Patient Name: Laurie Fields  MRN: [id number]  Epilepsy Attending: Charlsie Quest  Referring Physician/Provider: Dr Bing Neighbors Date: 03/23/2021 Duration: 23.27 mins  Patient history: 35 year old female with left facial twitching.  EEG to evaluate for seizures.  Level of alertness: awake, asleep  AEDs during EEG study: None  Technical aspects: This EEG study was done with scalp electrodes positioned according to the 10-20 International system of electrode placement. Electrical activity was acquired at a sampling rate of 500Hz  and reviewed with a high frequency filter of 70Hz  and a low frequency filter of 1Hz . EEG data were recorded continuously and digitally stored.   Description: The posterior dominant rhythm consists of 8-9 Hz activity of moderate voltage (25-35 uV) seen predominantly in posterior head regions, symmetric and reactive to eye opening and eye closing. Sleep was characterized by vertex waves, sleep spindles (12 to 14 Hz), maximal frontocentral region. Hyperventilation and photic stimulation were not performed.     IMPRESSION: This study is within normal limits. No seizures or epileptiform discharges were seen throughout the recording.  Espn Zeman 

## 2021-03-23 NOTE — ED Notes (Signed)
Patient has returned from Nuclear medicine and continues to be monitored by Stroke, RN and Lynford Humphrey, RN

## 2021-03-23 NOTE — ED Provider Notes (Addendum)
[facility] Provider Note   CSN: [id number] Arrival date & time:        History Chief Complaint  Patient presents with  . Code Stroke    Laurie Fields is a 35 y.o. female with past medical history of depression, anxiety, vertigo, and pseudotumor cerebri who presents the ED as a code stroke.  I reviewed her past medical history and she appears to have been scheduled for left heart cardiac stress test with Dr. Algie CofferKadakia.  During cardiac stress test, patient developed chest discomfort and left-sided headache.  Dr. Algie CofferKadakia then noticed that she had left-sided facial twitching and asymmetric smile.  He then assessed her upper extremity strength and she had weakness in left arm.  He brought her straight here to the ED as a code stroke.  Last known normal was 9 AM.  Neurology is here and evaluating patient.  No tPA contraindications.  Per Dr. Algie CofferKadakia, there were no ischemic changes on cardiac monitor during her stress test when she developed her HA with neuro deficits.  Her exam is nonspecific.    HPI     Past Medical History:  Diagnosis Date  . Asthma    Hx intubation R/T asthma  . Blurred vision   . Depression   . Migraine   . Obesity   . Ovarian cyst   . Pseudotumor cerebri   . Vertigo     Patient Active Problem List   Diagnosis Date Noted  . Acute ischemic stroke (HCC) 03/23/2021  . Paresthesia 11/04/2018  . Chronic migraine 10/08/2017  . Pseudotumor cerebri 10/08/2017    Past Surgical History:  Procedure Laterality Date  . ABDOMINAL HYSTERECTOMY    . carpel  tunnel release    . CESAREAN SECTION    . peritoneal cyst       OB History   No obstetric history on file.     Family History  Problem Relation Age of Onset  . Hypertension Other   . Diabetes Other   . Cancer Other   . CAD Other   . Leukemia Other   . Graves' disease Mother   . Other Mother        low blood sugar  . Hypertension Father   . Diabetes  Maternal Grandmother   . Colon polyps Maternal Grandmother   . Glaucoma Maternal Grandfather   . Cataracts Maternal Grandfather   . Prostate cancer Maternal Grandfather   . Diabetes Paternal Grandmother   . Cataracts Paternal Grandfather   . Prostate cancer Paternal Grandfather     Social History   Tobacco Use  . Smoking status: Never Smoker  . Smokeless tobacco: Never Used  Vaping Use  . Vaping Use: Never used  Substance Use Topics  . Alcohol use: No  . Drug use: No    Home Medications Prior to Admission medications   Medication Sig Start Date End Date Taking? Authorizing Provider  albuterol (PROVENTIL) (2.5 MG/3ML) 0.083% nebulizer solution Take 3 mLs (2.5 mg total) by nebulization every 6 (six) hours as needed for wheezing or shortness of breath. 12/07/20  Yes Wallis BambergMani, Mario, PA-C  albuterol (VENTOLIN HFA) 108 (90 Base) MCG/ACT inhaler Inhale 2 puffs into the lungs every 4 (four) hours as needed for wheezing or shortness of breath. 12/07/20  Yes Wallis BambergMani, Mario, PA-C  aspirin EC 81 MG EC tablet Take 1 tablet (81 mg total) by mouth daily. Swallow whole. 03/28/21   Bailey-Modzik, Jillene Buckselila A, NP  cholecalciferol (VITAMIN D) 25 MCG  tablet Take 1 tablet (1,000 Units total) by mouth daily. 03/28/21   Bailey-Modzik, Delila A, NP  senna-docusate (SENOKOT-S) 8.6-50 MG tablet Take 1 tablet by mouth at bedtime as needed for moderate constipation. 03/27/21   Bailey-Modzik, Delila A, NP  topiramate ER (QUDEXY XR) 200 MG CS24 sprinkle capsule Take 1 capsule (200 mg total) by mouth at bedtime. 03/27/21   Bailey-Modzik, Delila A, NP  traMADol (ULTRAM) 50 MG tablet Take 1 tablet (50 mg total) by mouth every 6 (six) hours as needed for moderate pain. 03/27/21   Bailey-Modzik, Delila A, NP  vitamin B-12 1000 MCG tablet Take 1 tablet (1,000 mcg total) by mouth daily. 03/28/21   Bailey-Modzik, Ward Chatters, NP    Allergies    Serevent [salmeterol]  Review of Systems   Review of Systems  All other systems reviewed and are  negative.   Physical Exam Updated Vital Signs BP 122/67 (BP Location: Left Arm)   Pulse 87   Temp 98.8 F (37.1 C) (Oral)   Resp 20   Ht 5\' 5"  (1.651 m)   Wt 133 kg   SpO2 100%   BMI 48.79 kg/m   Physical Exam Vitals and nursing note reviewed. Exam conducted with a chaperone present.  HENT:     Head: Normocephalic and atraumatic.  Eyes:     General: No scleral icterus.    Conjunctiva/sclera: Conjunctivae normal.  Cardiovascular:     Rate and Rhythm: Normal rate.     Pulses: Normal pulses.  Pulmonary:     Effort: Pulmonary effort is normal. No respiratory distress.     Comments: No increased work of breathing. Skin:    General: Skin is dry.  Neurological:     Mental Status: She is alert and oriented to person, place, and time.     GCS: GCS eye subscore is 4. GCS verbal subscore is 5. GCS motor subscore is 6.     Comments: Alert and oriented x3.  PERRL.  Patient was having difficulty holding left arm and left leg up.  No issues with contralateral side.  Sensation gross intact throughout.  No dysarthria.  Psychiatric:        Mood and Affect: Mood normal.        Behavior: Behavior normal.        Thought Content: Thought content normal.     ED Results / Procedures / Treatments   Labs (all labs ordered are listed, but only abnormal results are displayed) Labs Reviewed  LIPID PANEL - Abnormal; Notable for the following components:      Result Value   HDL 35 (*)    All other components within normal limits  I-STAT CHEM 8, ED - Abnormal; Notable for the following components:   Calcium, Ion 1.13 (*)    All other components within normal limits  MRSA PCR SCREENING  PROTIME-INR  APTT  CBC  DIFFERENTIAL  COMPREHENSIVE METABOLIC PANEL  HEMOGLOBIN A1C  HIV ANTIBODY (ROUTINE TESTING W REFLEX)  RAPID URINE DRUG SCREEN, HOSP PERFORMED  CBG MONITORING, ED  I-STAT BETA HCG BLOOD, ED ([facility], [facility], [facility] ONLY)    EKG EKG Interpretation  Date/Time:  Thursday March 23 2021  10:30:02 EDT Ventricular Rate:  72 PR Interval:  114 QRS Duration: 84 QT Interval:  403 QTC Calculation: 441 R Axis:   66 Text Interpretation: Sinus rhythm Borderline short PR interval Confirmed by [id number] [phone]) on 03/26/2021 12:33:05 AM   Radiology No results found.  Procedures .Critical Care Performed by: 05/26/2021,  Sharion Settler, PA-C Authorized by: Lorelee New, PA-C   Critical care provider statement:    Critical care time (minutes):  45   Critical care was necessary to treat or prevent imminent or life-threatening deterioration of the following conditions:  CNS failure or compromise   Critical care was time spent personally by me on the following activities:  Discussions with consultants, evaluation of patient's response to treatment, examination of patient, ordering and performing treatments and interventions, ordering and review of laboratory studies, ordering and review of radiographic studies, pulse oximetry, re-evaluation of patient's condition, obtaining history from patient or surrogate and review of old charts Comments:     Code stroke, activase utilized     Medications Ordered in ED Medications  alteplase (ACTIVASE) 1 mg/mL infusion 90 mg (0 mg Intravenous Stopped 03/23/21 1042)    Followed by  0.9 %  sodium chloride infusion (50 mLs Intravenous New Bag/Given 03/23/21 1034)  iohexol (OMNIPAQUE) 350 MG/ML injection 75 mL (75 mLs Intravenous Contrast Given 03/23/21 0956)  sodium chloride 0.9 % bolus 1,000 mL (1,000 mLs Intravenous New Bag/Given 03/23/21 1202)  ondansetron (ZOFRAN) tablet 4 mg (4 mg Oral Given 03/24/21 1213)  traMADol (ULTRAM) tablet 50 mg (50 mg Oral Given 03/26/21 0145)    ED Course  I have reviewed the triage vital signs and the nursing notes.  Pertinent labs & imaging results that were available during my care of the patient were reviewed by me and considered in my medical decision making (see chart for details).    MDM  Rules/Calculators/A&P                          Laurie Fields was evaluated in Emergency Department on 04/18/2021 for the symptoms described in the history of present illness. She was evaluated in the context of the global COVID-19 pandemic, which necessitated consideration that the patient might be at risk for infection with the SARS-CoV-2 virus that causes COVID-19. Institutional protocols and algorithms that pertain to the evaluation of patients at risk for COVID-19 are in a state of rapid change based on information released by regulatory bodies including the CDC and federal and state organizations. These policies and algorithms were followed during the patient's care in the ED.  I personally reviewed patient's medical chart and all notes from triage and staff during today's encounter. I have also ordered and reviewed all labs and imaging that I felt to be medically necessary in the evaluation of this patient's complaints and with consideration of their physical exam. If needed, translation services were available and utilized.   Patient presented to the ED as a code stroke from cardiology office.  She had been participating in a nuclear stress test when she developed her headache with facial droop and extremity weakness.  Initial CBG 74, unremarkable.  CT head code stroke obtained without contrast was a normal study.  Dr. Selina Cooley, neurology, ordered CTA head and neck for further evaluation.  tPA was administered at 0936.  On my examination, patient states that she has a posterior headache that is relatively comparable to priors, but simply worse.  She states that she had "fluid drained" from her pseudotumor cerebri while in [place].  She denies history of hydrocephalus or requiring shunts.  She denies any history of complex migraine.  She also endorses chest "pressure" but her chest pain is improved when compared to how she felt while receiving the cardiac stress test.  Laboratory work-up and  CT  imaging unremarkable.  Neurology will admit patient and obtain MRI brain.   Final Clinical Impression(s) / ED Diagnoses Final diagnoses:  Left-sided weakness    Rx / DC Orders ED Discharge Orders         Ordered    aspirin EC 81 MG EC tablet  Daily        03/27/21 1150    vitamin B-12 1000 MCG tablet  Daily        03/27/21 1150    cholecalciferol (VITAMIN D) 25 MCG tablet  Daily        03/27/21 1150    traMADol (ULTRAM) 50 MG tablet  Every 6 hours PRN        03/27/21 1150    senna-docusate (SENOKOT-S) 8.6-50 MG tablet  At bedtime PRN        03/27/21 1150    topiramate ER (QUDEXY XR) 200 MG CS24 sprinkle capsule  Daily at bedtime        03/27/21 1150    Ambulatory referral to Neurology       Comments: An appointment is requested in approximately: {Week(s): 4 weeks, NP ok   03/27/21 1150           Elvera Maria 03/23/21 1200    Gerhard Munch, MD 03/31/21 1700    Lorelee New, PA-C 04/18/21 1832    Gerhard Munch, MD 04/25/21 [phone]

## 2021-03-24 ENCOUNTER — Inpatient Hospital Stay (HOSPITAL_COMMUNITY): Payer: 59

## 2021-03-24 ENCOUNTER — Encounter (HOSPITAL_COMMUNITY): Payer: Self-pay | Admitting: Neurology

## 2021-03-24 DIAGNOSIS — I639 Cerebral infarction, unspecified: Secondary | ICD-10-CM

## 2021-03-24 DIAGNOSIS — I6389 Other cerebral infarction: Secondary | ICD-10-CM

## 2021-03-24 LAB — LIPID PANEL
Cholesterol: 142 mg/dL (ref 0–200)
HDL: 35 mg/dL — ABNORMAL LOW (ref >40)
LDL Cholesterol: 91 mg/dL (ref 0–99)
Total CHOL/HDL Ratio: 4.1 RATIO
Triglycerides: 79 mg/dL (ref <150)
VLDL: 16 mg/dL (ref 0–40)

## 2021-03-24 LAB — ECHOCARDIOGRAM COMPLETE
Area-P 1/2: 8.25 cm2
Calc EF: 61.1 %
Height: 65 in
S' Lateral: 2.8 cm
Single Plane A2C EF: 54.8 %
Single Plane A4C EF: 68.7 %
Weight: 4691.39 oz

## 2021-03-24 LAB — HEMOGLOBIN A1C
Hgb A1c MFr Bld: 5.3 % (ref 4.8–5.6)
Mean Plasma Glucose: 105.41 mg/dL

## 2021-03-24 LAB — HIV ANTIBODY (ROUTINE TESTING W REFLEX): HIV Screen 4th Generation wRfx: NONREACTIVE

## 2021-03-24 MED ORDER — DIPHENHYDRAMINE HCL 50 MG/ML IJ SOLN: 25.0000 mg | Freq: Once | INTRAMUSCULAR | Status: DC

## 2021-03-24 MED ORDER — ONDANSETRON HCL 4 MG PO TABS
4.0000 mg | ORAL_TABLET | Freq: Once | ORAL | Status: AC
  Administered 2021-03-24: 4 mg via ORAL
  Filled 2021-03-24: qty 1

## 2021-03-24 MED ORDER — TRAMADOL HCL 50 MG PO TABS: 50.0000 mg | ORAL_TABLET | Freq: Once | ORAL | Status: DC

## 2021-03-24 MED ORDER — ASPIRIN EC 81 MG PO TBEC
81.0000 mg | DELAYED_RELEASE_TABLET | Freq: Every day | ORAL | Status: DC
  Administered 2021-03-24 – 2021-03-27 (×4): 81 mg via ORAL
  Filled 2021-03-24 (×4): qty 1

## 2021-03-24 MED ORDER — DIPHENHYDRAMINE HCL 50 MG/ML IJ SOLN
12.5000 mg | Freq: Once | INTRAMUSCULAR | Status: DC
  Filled 2021-03-24: qty 1

## 2021-03-24 NOTE — Evaluation (Signed)
Occupational Therapy Evaluation Patient Details Name: Laurie Fields MRN: [id number] DOB: December 01, 1985 Today's Date: 03/24/2021    History of Present Illness 35 yo female admitted with L side weakness and hypophonic speech s/p TPA 4/28 MRI negativePMH Asthma, Blurred vision, obesity depression migraine vertigo pseudotumor  cerebri DM2   Clinical Impression   PT admitted with L side weakness and visual changes ( blurred vision). Pt currently with functional limitiations due to the deficits listed below (see OT problem list). Pt independent and working prior to admission. Pt currently requires total +2 mod (A) RW elevated surface to static stand at eob. Pt with L side weakness and sensation changes. Pt with diplopia and len taping this session for single vision but remains blurry. Pt reports blurriness has increased but denies color changes or absence of vision.  Pt will benefit from skilled OT to increase their independence and safety with adls and balance to allow discharge CIR to maximize indep.     Follow Up Recommendations  CIR    Equipment Recommendations  Other (comment);3 in 1 bedside commode (RW)    Recommendations for Other Services Rehab consult     Precautions / Restrictions Precautions Precautions: Fall      Mobility Bed Mobility Overal bed mobility: Needs Assistance Bed Mobility: Rolling;Supine to Sit;Sit to Supine Rolling: Min guard   Supine to sit: Min guard Sit to supine: +2 for physical assistance;Mod assist   General bed mobility comments: pt with HOB elevated progressed with bed rail to eob sitting with increased time and noted to hold breath. pt with extra effort to complete task and using R side to help (A) L side. Pt once static sitting min guard. pt reports not being able to tell L LE is on the floor. pt verbalized R is not on the floor but it was. Pt requires (A) to elevate bil LE onto bed surface for return to supine and (A) to control trunk. pt with  1 step commands to follow sequence    Transfers Overall transfer level: Needs assistance Equipment used: Rolling walker (2 wheeled) Transfers: Sit to/from Stand Sit to Stand: +2 physical assistance;Mod assist;From elevated surface         General transfer comment: pt with PT facilitating L LE. Pt with bil LE shoulder width apart in proper alignment to stand. when pt activates sit<>Stand pt taking R LE and stepping out so that feet are staggered even with cues not to do it. pt then cued to stand with feet shoulder width apart. Pt sit<>Stand x4 during session. pt requires 1 step commands to shift weight to advance x3 steps toward HOB. PT facilitating L LE to adduct and total (A) to move RW. pt could not coordinate sequence to step toward the R side hOB . pt needed cues to lean L to move R LE.    Balance Overall balance assessment: Needs assistance   Sitting balance-Leahy Scale: Fair     Standing balance support: Bilateral upper extremity supported;During functional activity Standing balance-Leahy Scale: Poor Standing balance comment: reliance on RW and cues for body aligment for safe standing                           ADL either performed or assessed with clinical judgement   ADL Overall ADL's : Needs assistance/impaired Eating/Feeding: NPO   Grooming: Wash/dry face;Minimal assistance;Bed level   Upper Body Bathing: Moderate assistance   Lower Body Bathing: Maximal assistance  Lower Body Dressing: Maximal assistance                 General ADL Comments: pt using R UE to (A) L side of body. pt asking for a rope to exercise L LE without awareness to decrease ability to complete knee flexion. pt provided a gait belt at the foot and encouraged visual attention with some plantar flexion in response that she was not doing at EOB. pt using gait belt to help position L LE in the bed. Pt verbalizes increased visual changes and diplopia see below     Vision  Baseline Vision/History: Wears glasses Wears Glasses: At all times Patient Visual Report: Blurring of vision Vision Assessment?: Yes Diplopia Assessment: Present all the time/all directions;Other (comment) (blurry but also double) Additional Comments: pt denies color changes but reports blurriness has increased. pt reports seeing monocular with occlusion. pt provided tape over R eye as pt reports L eye is best acuity to see. pt reports only seeing one with R len of glasses taped. pt states "thats better" pt educated that tape can be removed if need and its to help with reducing seeing double at this time to help with transfer.     Perception     Praxis      Pertinent Vitals/Pain Pain Assessment: 0-10 Pain Score: 6  Pain Location: headache Pain Descriptors / Indicators: Headache Pain Intervention(s): Monitored during session;Repositioned     Hand Dominance Right   Extremity/Trunk Assessment Upper Extremity Assessment Upper Extremity Assessment: LUE deficits/detail LUE Deficits / Details: pt reports decrease sensation changes in proprioception and inability to complete shoulder flexion on request and instead twisting trunk in an attempt to use compensatory strategy. OT raising L UE into shoulder flexion 110 degrees and noted to have drift but controlling arm with activation. pt unable to repeat the same motion for flexion. pt unable to shrug on command. pt asked to complete scapual retraction and moving R side only. LUE Sensation: decreased light touch;decreased proprioception LUE Coordination: decreased fine motor;decreased gross motor   Lower Extremity Assessment Lower Extremity Assessment: Defer to PT evaluation   Cervical / Trunk Assessment Cervical / Trunk Assessment: Normal;Other exceptions (rounded shoulders due to body habitus)   Communication Communication Communication: No difficulties   Cognition Arousal/Alertness: Awake/alert Behavior During Therapy: WFL for tasks  assessed/performed Overall Cognitive Status: Within Functional Limits for tasks assessed                                 General Comments: A&Ox4.   General Comments  VSS BP taken and stable. pt able to verbalize correctly that the purewick leaked    Exercises Exercises: Other exercises Other Exercises Other Exercises: taping R len of glasses for diplopia due to L eye per patient report having best acuity with glasses and monocular vision   Shoulder Instructions      Home Living Family/patient expects to be discharged to:: Private residence Living Arrangements: Children (teenagers, 3) Available Help at Discharge: Friend(s);Available 24 hours/day Type of Home: Mobile home Home Access: Stairs to enter Entrance Stairs-Number of Steps: 5 Entrance Stairs-Rails: Right (ascending) Home Layout: One level     Bathroom Shower/Tub: Producer, television/film/video: Standard     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 Independent with all ADLs and functional mobility.        OT Problem List: Decreased strength;Decreased range of motion;Decreased activity tolerance;Impaired balance (sitting and/or standing);Impaired vision/perception;Decreased cognition;Decreased safety awareness;Decreased knowledge of use of DME or AE;Decreased knowledge of precautions;Cardiopulmonary status limiting activity;Obesity;Impaired sensation;Impaired UE functional use;Pain      OT Treatment/Interventions: Self-care/ADL training;Therapeutic exercise;Neuromuscular education;Energy conservation;DME and/or AE instruction;Manual therapy;Modalities;Therapeutic activities;Cognitive remediation/compensation;Visual/perceptual remediation/compensation;Patient/family education;Balance training    OT Goals(Current goals can be found in the  care plan section) Acute Rehab OT Goals Patient Stated Goal: to be able to take shower in bathroom OT Goal Formulation: With patient Time For Goal Achievement: 04/07/21 Potential to Achieve Goals: Good  OT Frequency: Min 2X/week   Barriers to D/C: Decreased caregiver support  lives alone with x3 teenagers with a friend that can help as needed       Co-evaluation PT/OT/SLP Co-Evaluation/Treatment: Yes Reason for Co-Treatment: For patient/therapist safety;To address functional/ADL transfers   OT goals addressed during session: ADL's and self-care;Proper use of Adaptive equipment and DME;Strengthening/ROM      AM-PAC OT "6 Clicks" Daily Activity     Outcome Measure Help from another person eating meals?: A Little Help from another person taking care of personal grooming?: A Little Help from another person toileting, which includes using toliet, bedpan, or urinal?: A Lot Help from another person bathing (including washing, rinsing, drying)?: A Lot Help from another person to put on and taking off regular upper body clothing?: A Little Help from another person to put on and taking off regular lower body clothing?: A Lot 6 Click Score: 15   End of Session Equipment Utilized During Treatment: Rolling walker;Gait belt Nurse Communication: Mobility status;Precautions  Activity Tolerance: Patient tolerated treatment well Patient left: in bed;with call bell/phone within reach;with bed alarm set  OT Visit Diagnosis: Unsteadiness on feet (R26.81);Muscle weakness (generalized) (M62.81);Pain (reports some nausea)                Time: 3154-0086 OT Time Calculation (min): 33 min Charges:  OT General Charges $OT Visit: 1 Visit OT Evaluation $OT Eval Moderate Complexity: 1 Mod   Brynn, OTR/L  Acute Rehabilitation Services Pager: [phone] Office: [phone] .   Mateo Flow 03/24/2021, 2:34 PM

## 2021-03-24 NOTE — Evaluation (Addendum)
Speech Language Pathology Evaluation Patient Details Name: Laurie Fields MRN: [id number] DOB: 25-Nov-1986 Today's Date: 03/24/2021 Time: 1201-1219 SLP Time Calculation (min) (ACUTE ONLY): 18 min  Problem List:  Patient Active Problem List   Diagnosis Date Noted  . Acute ischemic stroke (HCC) 03/23/2021  . Paresthesia 11/04/2018  . Chronic migraine 10/08/2017  . Pseudotumor cerebri 10/08/2017   Past Medical History:  Past Medical History:  Diagnosis Date  . Asthma    Hx intubation R/T asthma  . Blurred vision   . Depression   . Migraine   . Obesity   . Ovarian cyst   . Pseudotumor cerebri   . Vertigo    Past Surgical History:  Past Surgical History:  Procedure Laterality Date  . ABDOMINAL HYSTERECTOMY    . carpel  tunnel release    . CESAREAN SECTION    . peritoneal cyst     HPI:  Pt is a 35 y.o. female with PMH significant for Asthma, Blurred vision, Depression, Migraine, Obesity, Ovarian cyst, Pseudotumor cerebri, and Vertigo. Pt [facility] for acute onset L sided weakness and aphasia during cardiac stress test. tPA was given 4/28. MRI brain 4/28 was negative. CTA negative for LVO or stenosis. EEG 4/28: within normal limits. Pt failed the [facility] since she stopped drinking.   Assessment / Plan / Recommendation Clinical Impression  Pt participated in speech/language/cognition evaluation. Pt denied any baseline deficits in these areas and reported that she completed two years of college and is employed as a Armed forces technical officer. Pt denied any change in articulatory precision, but desribed her voice as "hoarse" and stated that her thinking is now "slow". The [facility] Mental Status Examination was completed to evaluate the pt's cognitive-linguistic skills. She achieved a score of 14/30 which is below the normal limits of 27 or more out of 30. She exhibited difficulty in the areas of awareness, memory, complex problem  solving, and executive function. Skilled SLP services are clinically indicated at this time to improve cognitive-linguistic function.    SLP Assessment  SLP Recommendation/Assessment: Patient needs continued Speech Lanaguage Pathology Services SLP Visit Diagnosis: Cognitive communication deficit (R41.841)    Follow Up Recommendations  Inpatient Rehab    Frequency and Duration min 2x/week  2 weeks      SLP Evaluation Cognition  Overall Cognitive Status: Impaired/Different from baseline Arousal/Alertness: Awake/alert Orientation Level: Oriented X4 Attention: Focused;Sustained;Selective Focused Attention: Appears intact Sustained Attention: Appears intact Selective Attention: Impaired Selective Attention Impairment: Verbal complex Memory: Impaired Memory Impairment: Retrieval deficit;Decreased recall of new information (Immediate: 5/5; delayed: 3/5; with cues: 0/2) Awareness: Impaired Awareness Impairment: Emergent impairment Problem Solving: Impaired Problem Solving Impairment: Verbal complex Executive Function: Organizing;Sequencing Sequencing: Impaired Sequencing Impairment: Verbal complex (clock drawing:0/4) Organizing: Impaired Organizing Impairment: Verbal complex (Backward digit span: 0/2)       Comprehension  Auditory Comprehension Overall Auditory Comprehension: Appears within functional limits for tasks assessed Yes/No Questions: Within Functional Limits Commands: Within Functional Limits (2-step 4/4; 3-step: 3/3) Conversation: Complex Reading Comprehension Reading Status: Not tested    Expression Expression Primary Mode of Expression: Verbal Verbal Expression Overall Verbal Expression: Appears within functional limits for tasks assessed Initiation: No impairment Repetition: No impairment Pragmatics: No impairment Written Expression Dominant Hand: Right   Oral / Motor  Oral Motor/Sensory Function Overall Oral Motor/Sensory Function: Mild  impairment Facial ROM: Within Functional Limits Facial Symmetry: Within Functional Limits Facial Strength: Within Functional Limits Facial Sensation: Reduced left;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 Vear Clock, MS, CCC-SLP Acute Rehabilitation Services Office number [phone] Pager [phone]                    Scheryl Marten 03/24/2021, 1:50 PM

## 2021-03-24 NOTE — Consult Note (Signed)
Ref: Mammie Lorenzo, MD   Subjective:  Awake. Tongue midline and able to move side to side. Left sided weakness continues but has flexed left knee on video monitor better than when asked to bend it. Vision remains blurry per patient. VS stable. Patient aware of normal EEG, CT head CT angio brain and neck and MRI.  Objective:  Vital Signs in the last 24 hours: Temp:  [98.7 F (37.1 C)-99.2 F (37.3 C)] 98.7 F (37.1 C) (04/29 0000) Pulse Rate:  [56-75] 64 (04/29 0800) Cardiac Rhythm: Normal sinus rhythm (04/28 2000) Resp:  [12-112] 15 (04/29 0800) BP: (92-138)/(52-100) 94/60 (04/29 0800) SpO2:  [93 %-100 %] 99 % (04/29 0800) Weight:  [235 kg] 133 kg (04/28 0939)  Physical Exam: BP Readings from Last 1 Encounters:  03/24/21 94/60     Wt Readings from Last 1 Encounters:  03/23/21 133 kg    Weight change:  Body mass index is 48.79 kg/m. HEENT: [place]/AT, Eyes-Brown, Conjunctiva-Pink, Sclera-Non-icteric Neck: No JVD, No bruit, Trachea midline. Lungs:  Clear, Bilateral. Cardiac:  Regular rhythm, normal S1 and S2, no S3. II/VI systolic murmur. Abdomen:  Soft, non-tender. BS present. Extremities:  No edema present. No cyanosis. No clubbing. CNS: AxOx3, Cranial nerves grossly intact, moves all 4 extremities with left sided weakness..  Skin: Warm and dry.   Intake/Output from previous day: 04/28 0701 - 04/29 0700 In: 1229.5 [I.V.:1229.5] Out: 1150 [Urine:1150]    Lab Results: BMET    Component Value Date/Time   NA 142 03/23/2021 0949   NA 139 03/23/2021 0920   NA 140 12/05/2020 2157   K 4.2 03/23/2021 0949   K 4.0 03/23/2021 0920   K 3.6 12/05/2020 2157   CL 106 03/23/2021 0949   CL 105 03/23/2021 0920   CL 105 12/05/2020 2157   CO2 26 03/23/2021 0920   CO2 25 12/05/2020 2157   CO2 26 09/12/2020 2150   GLUCOSE 93 03/23/2021 0949   GLUCOSE 95 03/23/2021 0920   GLUCOSE 86 12/05/2020 2157   BUN 15 03/23/2021 0949   BUN 13 03/23/2021 0920   BUN 9 12/05/2020 2157    CREATININE 0.70 03/23/2021 0949   CREATININE 0.78 03/23/2021 0920   CREATININE 0.89 12/05/2020 2157   CALCIUM 8.9 03/23/2021 0920   CALCIUM 9.0 12/05/2020 2157   CALCIUM 8.7 (L) 09/12/2020 2150   GFRNONAA >60 03/23/2021 0920   GFRNONAA >60 12/05/2020 2157   GFRNONAA >60 09/12/2020 2150   GFRAA >60 11/08/2018 0112   GFRAA >60 05/20/2018 1605   GFRAA >60 01/05/2018 2108   CBC    Component Value Date/Time   WBC 8.6 03/23/2021 0920   RBC 4.57 03/23/2021 0920   HGB 13.6 03/23/2021 0949   HCT 40.0 03/23/2021 0949   PLT 361 03/23/2021 0920   MCV 88.8 03/23/2021 0920   MCH 28.4 03/23/2021 0920   MCHC 32.0 03/23/2021 0920   RDW 13.8 03/23/2021 0920   LYMPHSABS 1.7 03/23/2021 0920   MONOABS 0.4 03/23/2021 0920   EOSABS 0.2 03/23/2021 0920   BASOSABS 0.0 03/23/2021 0920   HEPATIC Function Panel Recent Labs    03/23/21 0920  PROT 7.6   HEMOGLOBIN A1C No components found for: HGA1C,  MPG CARDIAC ENZYMES No results found for: CKTOTAL, CKMB, CKMBINDEX, TROPONINI BNP No results for input(s): PROBNP in the last 8760 hours. TSH No results for input(s): TSH in the last 8760 hours. CHOLESTEROL Recent Labs    03/24/21 0808  CHOL 142    Scheduled Meds: .  stroke: mapping our early stages of recovery book   Does not apply Once  . Chlorhexidine Gluconate Cloth  6 each Topical Daily  . pantoprazole (PROTONIX) IV  40 mg Intravenous QHS  . sodium chloride flush  3 mL Intravenous Once  . topiramate ER  200 mg Oral QHS   Continuous Infusions: . sodium chloride 125 mL/hr at 03/24/21 0800  . clevidipine     PRN Meds:.acetaminophen **OR** acetaminophen (TYLENOL) oral liquid 160 mg/5 mL **OR** acetaminophen, senna-docusate  Assessment/Plan: Reversible ischemic neurologic deficit Chest pain CAD Morbid obesity Asthma H/O pseudotumor cerebri H/O migraine headache  Awaiting repeat CT head. Awaiting echocardiogram.   LOS: 1 day   Time spent including chart review, lab  review, examination, discussion with patient/Nurse : 30 min   Orpah Cobb  MD  03/24/2021, 9:18 AM

## 2021-03-24 NOTE — Progress Notes (Signed)
Pt decided not to leave AMA after signing paperwork but would like a different nurse.

## 2021-03-24 NOTE — Progress Notes (Signed)
Inpatient Rehab Admissions Coordinator Note:   Per therapy recommendations, pt was screened for CIR candidacy by Estill Dooms, PT, DPT.  At this time all imaging negative for acute process.  Would not recommend a consult at this time.  Please contact me with questions.   Estill Dooms, PT, DPT [phone] 03/24/21 4:00 PM

## 2021-03-24 NOTE — Evaluation (Signed)
Clinical/Bedside Swallow Evaluation Patient Details  Name: Laurie Fields MRN: [id number] Date of Birth: 05-01-1986  Today's Date: 03/24/2021 Time: SLP Start Time (ACUTE ONLY): 1145 SLP Stop Time (ACUTE ONLY): 1200 SLP Time Calculation (min) (ACUTE ONLY): 14.38 min  Past Medical History:  Past Medical History:  Diagnosis Date  . Asthma    Hx intubation R/T asthma  . Blurred vision   . Depression   . Migraine   . Obesity   . Ovarian cyst   . Pseudotumor cerebri   . Vertigo    Past Surgical History:  Past Surgical History:  Procedure Laterality Date  . ABDOMINAL HYSTERECTOMY    . carpel  tunnel release    . CESAREAN SECTION    . peritoneal cyst     HPI:  Pt is a 35 y.o. female with PMH significant for Asthma, Blurred vision, Depression, Migraine, Obesity, Ovarian cyst, Pseudotumor cerebri, and Vertigo. Pt presented as [facility] for acute onset L sided weakness and aphasia during cardiac stress test. tPA was given 4/28. MRI brain 4/28 was negative. CTA negative for LVO or stenosis. EEG 4/28: within normal limits. Pt failed the [facility] since she stopped drinking.   Assessment / Plan / Recommendation Clinical Impression  Pt was seen for bedside swallow evaluation and she denied a history of dysphagia. Oral mechanism exam was significant for reduced left facial sensation, but otherwise WFL and dentition was adequate. She tolerated all solids and liquids without signs or symptoms of aspiration. Mastication and oral clearance were adequate, but pt reported the sensation of "chewing my cheek" during mastication of regular texture solids. A regular texture diet with thin liquids is recommended at this time. SLP will follow briefly to ensure diet tolerance. SLP Visit Diagnosis: Dysphagia, unspecified (R13.10)    Aspiration Risk  Mild aspiration risk    Diet Recommendation Regular;Thin liquid   Liquid Administration via: Cup;Straw Medication  Administration: Whole meds with liquid Supervision: Staff to assist with self feeding Postural Changes: Seated upright at 90 degrees    Other  Recommendations Oral Care Recommendations: Oral care BID   Follow up Recommendations Inpatient Rehab      Frequency and Duration min 1 x/week  1 week       Prognosis        Swallow Study   General Date of Onset: 03/23/21 HPI: Pt is a 35 y.o. female with PMH significant for Asthma, Blurred vision, Depression, Migraine, Obesity, Ovarian cyst, Pseudotumor cerebri, and Vertigo. Pt presented as stroke code [facility] for acute onset L sided weakness and aphasia during cardiac stress test. tPA was given 4/28. MRI brain 4/28 was negative. CTA negative for LVO or stenosis. EEG 4/28: within normal limits. Pt failed the [facility] since she stopped drinking. Type of Study: Bedside Swallow Evaluation Previous Swallow Assessment: none Diet Prior to this Study: NPO Temperature Spikes Noted: No Respiratory Status: Room air History of Recent Intubation: No Behavior/Cognition: Alert;Cooperative;Pleasant mood Oral Cavity Assessment: Within Functional Limits Oral Care Completed by SLP: No Oral Cavity - Dentition: Adequate natural dentition Vision: Functional for self-feeding Self-Feeding Abilities: Able to feed self Patient Positioning: Upright in bed;Postural control adequate for testing Baseline Vocal Quality: Low vocal intensity;Hoarse Volitional Cough: Strong Volitional Swallow: Able to elicit    Oral/Motor/Sensory Function Overall Oral Motor/Sensory Function: Mild impairment Facial ROM: Within Functional Limits Facial Symmetry: Within Functional Limits Facial Strength: Within Functional Limits Facial Sensation: Reduced left;Suspected CN V (Trigeminal) dysfunction Lingual ROM: Reduced right;Reduced left  Lingual Symmetry: Within Functional Limits Lingual Strength: Within Functional Limits Lingual Sensation: Within Functional Limits    Ice Chips Ice chips: Within functional limits Presentation: Spoon   Thin Liquid Thin Liquid: Within functional limits Presentation: Straw    Nectar Thick Nectar Thick Liquid: Not tested   Honey Thick Honey Thick Liquid: Not tested   Puree Puree: Within functional limits Presentation: Spoon   Solid     Solid: Within functional limits Presentation: Self Fed     Laurie Swamy I. Vear Clock, MS, CCC-SLP Acute Rehabilitation Services Office number [phone] Pager [phone]  Scheryl Marten 03/24/2021,12:47 PM

## 2021-03-24 NOTE — Progress Notes (Addendum)
Brief Neuro Note:  Notified by RN that Laurie Fields has a new onset RUE drift, shortly afterwards, paged to inform me that Laurie Fields would like to leave hospital. She is post tPA and within 24 hours and has not had a repeat CTH.  I discussed with patient the risks of tPA and discussed that she still has workup pending to understand the precise cause of her presentation. Discussed risks of bleeding and death and worsening of her symptoms. She undesrtands the risks and made a decision to leave the hospital against medical advice. I discussed with her that with the weekend approaching, it is going to be difficult to schedule her for follow up right now or a repeat CTH.  Recs: - Will try to see if we can get the repeat 24 hours post tPA [facility] right now. Not ideal as she is about 18 hours after receiving tPA, but I think it is better to have repeat imaging to rule out ICH at this time, will see if we can get this before she leaves.  Update: Patient refused repeat CTH at this time.  Erick Blinks [facility] Pager Number [phone]

## 2021-03-24 NOTE — Progress Notes (Signed)
Patient still would like to leave, stating that she has secured a ride but unsure of when the ride will get here, AMA paperwork signed @ 0225 as requested by patient.

## 2021-03-24 NOTE — Evaluation (Signed)
Physical Therapy Evaluation Patient Details Name: Laurie Fields MRN: [id number] DOB: 08/20/1986 Today's Date: 03/24/2021   History of Present Illness  35 yo female admitted 4/28 with L side weakness and hypophonic speech. s/p TPA. 4/28 MRI negative. PMH Asthma, Blurred vision, obesity depression migraine vertigo pseudotumor  cerebri DM2    Clinical Impression  Pt presents with condition above and deficits mentioned below, see PT Problem List. PTA, she was independent and working. Pt has 3 teenagers at home and lives in a mobile home with 5 STE with 1 handrail. Currently, pt demonstrates diplopia, L sided weakness, decreased L sided sensation and proprioception, imbalance, and incoordination that place her at high risk for falls. Pt currently requires modAx2 to transfer to stand and take several small steps to the R at EOB using a RW. Will continue to follow acutely. Pt would greatly benefit from intensive therapy in the CIR setting to maximize safety and independence with all functional mobility.    Follow Up Recommendations CIR    Equipment Recommendations  Rolling walker with 5" wheels;3in1 (PT)    Recommendations for Other Services Rehab consult     Precautions / Restrictions Precautions Precautions: Fall Restrictions Weight Bearing Restrictions: No      Mobility  Bed Mobility Overal bed mobility: Needs Assistance Bed Mobility: Rolling;Supine to Sit;Sit to Supine Rolling: Min guard   Supine to sit: Min guard Sit to supine: +2 for physical assistance;Mod assist   General bed mobility comments: pt with HOB elevated progressed with bed rail to eob sitting with increased time and noted to hold breath. pt with extra effort to complete task and using R side to help (A) L side. Pt once static sitting min guard. pt reports not being able to tell L leg is on the floor. pt verbalized R is not on the floor but it was. Pt requires (A) to elevate bil legs onto bed surface for  return to supine and (A) to control trunk. pt with 1 step commands to follow sequence    Transfers Overall transfer level: Needs assistance Equipment used: Rolling walker (2 wheeled) Transfers: Sit to/from Stand Sit to Stand: +2 physical assistance;Mod assist;From elevated surface         General transfer comment: pt with PT facilitating L leg. Pt with bil legs shoulder width apart in proper alignment to stand. when pt activates sit<>Stand pt taking R leg and stepping out so that feet are staggered even with cues not to do it on subsequent reps. pt then cued to stand with feet shoulder width apart. Pt sit<>Stand x4 during session. pt requires 1 step commands to shift weight to advance x3 steps toward HOB. PT facilitating L leg to adduct and total (A) to move RW. pt could not coordinate sequence to step toward the R side HOB . pt needed cues to lean L to move R leg.  Ambulation/Gait Ambulation/Gait assistance: Mod assist;+2 physical assistance;+2 safety/equipment Gait Distance (Feet): 3 Feet Assistive device: Rolling walker (2 wheeled) Gait Pattern/deviations: Decreased step length - right;Decreased step length - left;Shuffle;Decreased weight shift to left;Decreased stance time - left;Decreased stride length;Decreased dorsiflexion - left;Trunk flexed Gait velocity: reduced Gait velocity interpretation: <1.31 ft/sec, indicative of household ambulator General Gait Details: pt requires 1 step commands to shift weight to advance x3 steps toward [facility] towards R. PT facilitating L leg to adduct and total (A) to move RW. pt could not coordinate sequence to step toward the R side HOB . pt needed cues to lean L to  move R leg. Cues for upright posture.  Stairs            Wheelchair Mobility    Modified Rankin (Stroke Patients Only)       Balance Overall balance assessment: Needs assistance   Sitting balance-Leahy Scale: Fair     Standing balance support: Bilateral upper extremity  supported;During functional activity Standing balance-Leahy Scale: Poor Standing balance comment: reliance on RW and cues for body aligment for safe standing                             Pertinent Vitals/Pain Pain Assessment: 0-10 Pain Score: 6  Pain Location: headache Pain Descriptors / Indicators: Headache Pain Intervention(s): Limited activity within patient's tolerance;Monitored during session;Repositioned    Home Living Family/patient expects to be discharged to:: Private residence Living Arrangements: Children (teenagers, 3) Available Help at Discharge: Friend(s);Available 24 hours/day Type of Home: Mobile home Home Access: Stairs to enter Entrance Stairs-Rails: Right (ascending) Entrance Stairs-Number of Steps: 5 Home Layout: One level Home Equipment: Shower seat Additional Comments: works    Prior Function Level of Independence: Independent         Comments: Pt drives and cares for her 3 children. Independent with all ADLs and functional mobility.     Hand Dominance   Dominant Hand: Right    Extremity/Trunk Assessment   Upper Extremity Assessment Upper Extremity Assessment: Defer to OT evaluation LUE Deficits / Details: pt reports decrease sensation changes in proprioception and inability to complete shoulder flexion on request and instead twisting trunk in an attempt to use compensatory strategy. OT raising L UE into shoulder flexion 110 degrees and noted to have drift but controlling arm with activation. pt unable to repeat the same motion for flexion. pt unable to shrug on command. pt asked to complete scapual retraction and moving R side only. LUE Sensation: decreased light touch;decreased proprioception LUE Coordination: decreased fine motor;decreased gross motor    Lower Extremity Assessment Lower Extremity Assessment: LLE deficits/detail LLE Deficits / Details: MMT scores of as low as 2+ and up to 3+, using compensatory techniques through  leaning posteriorly or laterally LLE Sensation: decreased light touch;decreased proprioception LLE Coordination: decreased fine motor;decreased gross motor    Cervical / Trunk Assessment Cervical / Trunk Assessment: Normal;Other exceptions (rounded shoulders due to body habitus)  Communication   Communication: No difficulties  Cognition Arousal/Alertness: Awake/alert Behavior During Therapy: WFL for tasks assessed/performed Overall Cognitive Status: Within Functional Limits for tasks assessed                                 General Comments: A&Ox4.      General Comments General comments (skin integrity, edema, etc.): VSS BP taken and stable. pt able to verbalize correctly that the purewick leaked    Exercises Other Exercises Other Exercises: taping R len of glasses for diplopia due to L eye per patient report having best acuity with glasses and monocular vision   Assessment/Plan    PT Assessment Patient needs continued PT services  PT Problem List Decreased strength;Decreased range of motion;Decreased activity tolerance;Decreased balance;Decreased mobility;Decreased coordination;Decreased knowledge of use of DME;Decreased safety awareness;Impaired sensation;Obesity       PT Treatment Interventions DME instruction;Gait training;Stair training;Functional mobility training;Therapeutic activities;Therapeutic exercise;Balance training;Neuromuscular re-education;Patient/family education    PT Goals (Current goals can be found in the Care Plan section)  Acute Rehab PT Goals Patient  Stated Goal: to be able to take shower in bathroom PT Goal Formulation: With patient Time For Goal Achievement: 04/07/21 Potential to Achieve Goals: Good    Frequency Min 4X/week   Barriers to discharge        Co-evaluation PT/OT/SLP Co-Evaluation/Treatment: Yes Reason for Co-Treatment: For patient/therapist safety;To address functional/ADL transfers PT goals addressed during  session: Mobility/safety with mobility;Balance;Proper use of DME OT goals addressed during session: ADL's and self-care;Proper use of Adaptive equipment and DME;Strengthening/ROM       AM-PAC PT "6 Clicks" Mobility  Outcome Measure Help needed turning from your back to your side while in a flat bed without using bedrails?: A Little Help needed moving from lying on your back to sitting on the side of a flat bed without using bedrails?: A Little Help needed moving to and from a bed to a chair (including a wheelchair)?: A Lot Help needed standing up from a chair using your arms (e.g., wheelchair or bedside chair)?: A Lot Help needed to walk in hospital room?: Total Help needed climbing 3-5 steps with a railing? : Total 6 Click Score: 12    End of Session Equipment Utilized During Treatment: Gait belt Activity Tolerance: Patient tolerated treatment well Patient left: in bed;Other (comment) (with OT) Nurse Communication: Mobility status PT Visit Diagnosis: Unsteadiness on feet (R26.81);Other abnormalities of gait and mobility (R26.89);Muscle weakness (generalized) (M62.81);Difficulty in walking, not elsewhere classified (R26.2);Other symptoms and signs involving the nervous system (R29.898);Hemiplegia and hemiparesis Hemiplegia - Right/Left: Left Hemiplegia - dominant/non-dominant: Non-dominant Hemiplegia - caused by: Unspecified    Time: 3295-1884 PT Time Calculation (min) (ACUTE ONLY): 28 min   Charges:   PT Evaluation $PT Eval Moderate Complexity: 1 Mod          Raymond Gurney, PT, DPT Acute Rehabilitation Services  Pager: [phone] Office: [phone]   Jewel Baize 03/24/2021, 2:56 PM

## 2021-03-24 NOTE — Progress Notes (Signed)
Pt with new RUE drift, neuro paged and will come to assess patient.   Pt now requesting to leave AMA, when asked why she refuses to talk and states "I already told you, give me my discharge papers. I am done talking."

## 2021-03-24 NOTE — Progress Notes (Signed)
OT Cancellation Note  Patient Details Name: Sulay Brymer MRN: [id number] DOB: 1986/02/04   Cancelled Treatment:    Reason Eval/Treat Not Completed: Active bedrest order (Strict BR. Will return as schedule allows.)  Halyn Flaugher M C[place] Jazma Pickel MSOT, OTR/L Acute Rehab Pager: [phone] Office: [phone] 03/24/2021, 6:56 AM

## 2021-03-24 NOTE — Progress Notes (Addendum)
STROKE TEAM PROGRESS NOTE   INTERVAL HISTORY Her RN is at the bedside.  Patient awake, alert, oriented. NAD. C/o blurred vision in right eye. Hypophonic speech. C/o headaches but states it is different from prior migraines,. MRI negative for stroke. EEG was negative fore seizures. Vitals:   03/24/21 0500 03/24/21 0600 03/24/21 0700 03/24/21 0800  BP: (!) 92/52 94/70 (!) 97/54 94/60  Pulse: (!) 57 (!) 57 63 64  Resp: 13 17 14 15   Temp:      TempSrc:      SpO2: 98% 100% 99% 99%  Weight:      Height:       CBC:  Recent Labs  Lab 03/23/21 0920 03/23/21 0949  WBC 8.6  --   NEUTROABS 6.3  --   HGB 13.0 13.6  HCT 40.6 40.0  MCV 88.8  --   PLT 361  --    Basic Metabolic Panel:  Recent Labs  Lab 03/23/21 0920 03/23/21 0949  NA 139 142  K 4.0 4.2  CL 105 106  CO2 26  --   GLUCOSE 95 93  BUN 13 15  CREATININE 0.78 0.70  CALCIUM 8.9  --    Lipid Panel:  Recent Labs  Lab 03/24/21 0808  CHOL 142  TRIG 79  HDL 35*  CHOLHDL 4.1  VLDL 16  LDLCALC 91   HgbA1c:  Recent Labs  Lab 03/24/21 0808  HGBA1C 5.3   IMAGING past 24 hours MR BRAIN WO CONTRAST  Result Date: 03/23/2021 CLINICAL DATA:  Stroke follow-up. EXAM: MRI HEAD WITHOUT CONTRAST TECHNIQUE: Multiplanar, multiecho pulse sequences of the brain and surrounding structures were obtained without intravenous contrast. COMPARISON:  Head CT March 23, 2021. FINDINGS: Brain: No acute infarction, hemorrhage, hydrocephalus, extra-axial collection or mass lesion. Vascular: Normal flow voids. Skull and upper cervical spine: Normal marrow signal. Sinuses/Orbits: Partially obscured by susceptibility artifact. Other: None. IMPRESSION: No acute intracranial abnormality identified. Unremarkable MRI of the brain. Electronically Signed   By: March 25, 2021 M.D.   On: 03/23/2021 15:50   NM Myocar Multi W/Spect W/Wall Motion / EF  Result Date: 03/23/2021 CLINICAL DATA:  Chest pain since January 2022. Abnormal stress test  and EKG. Stent. Asthma. EXAM: MYOCARDIAL IMAGING WITH SPECT (REST AND PHARMACOLOGIC-STRESS) GATED LEFT VENTRICULAR WALL MOTION STUDY LEFT VENTRICULAR EJECTION FRACTION TECHNIQUE: Standard myocardial SPECT imaging was performed after resting intravenous injection of 11 mCi Tc-26m tetrofosmin. Subsequently, intravenous infusion of Lexiscan was performed under the supervision of the Cardiology staff. At peak effect of the drug, 30 mCi Tc-16m tetrofosmin was injected intravenously and standard myocardial SPECT imaging was performed. Quantitative gated imaging was also performed to evaluate left ventricular wall motion, and estimate left ventricular ejection fraction. COMPARISON:  None. FINDINGS: Perfusion: Moderate-sized region of reduced activity at the base of the muscular septal wall on stress images compared to rest images, compatible with moderate inducible ischemic defect. Apical thinning/scarring noted, but with a greater amount of reduction in activity along the lateral apex on stress images compared to rest images, compatible with a small inducible defect. Wall Motion: Mild hypokinesis at the apex. No left ventricular dilatation. Left Ventricular Ejection Fraction: 52 % End diastolic volume 91 ml End systolic volume 44 ml IMPRESSION: 1. Moderate inducible defect in the basilar portion of the muscular septum on stress images compared to rest images, compatible with moderate inducible ischemia. Apical thinning or scarring, with a small amount of inducible ischemia along the lateral apex. 2. Mild apical hypokinesis. 3.  Left ventricular ejection fraction 52% 4. Non invasive risk stratification*: Intermediate *2012 Appropriate Use Criteria for Coronary Revascularization Focused Update: J Am Coll Cardiol. 2012;59(9):857-881. http://content.dementiazones.comonlinejacc.org/article.aspx?articleid=1201161 Electronically Signed   By: Gaylyn RongWalter  Liebkemann M.D.   On: 03/23/2021 15:51   EEG adult  Result Date: 03/23/2021 Charlsie QuestYadav, Priyanka O,  MD     03/23/2021 12:35 PM Patient Name: Laurie MuVictoria Fields MRN: [id number] Epilepsy Attending: Charlsie QuestPriyanka O Yadav Referring Physician/Provider: Dr Bing Neighborsolleen Stack Date: 03/23/2021 Duration: 23.27 mins Patient history: 35 year old female with left facial twitching.  EEG to evaluate for seizures. Level of alertness: awake, asleep AEDs during EEG study: None Technical aspects: This EEG study was done with scalp electrodes positioned according to the 10-20 International system of electrode placement. Electrical activity was acquired at a sampling rate of 500Hz  and reviewed with a high frequency filter of 70Hz  and a low frequency filter of 1Hz . EEG data were recorded continuously and digitally stored. Description: The posterior dominant rhythm consists of 8-9 Hz activity of moderate voltage (25-35 uV) seen predominantly in posterior head regions, symmetric and reactive to eye opening and eye closing. Sleep was characterized by vertex waves, sleep spindles (12 to 14 Hz), maximal frontocentral region. Hyperventilation and photic stimulation were not performed.   IMPRESSION: This study is within normal limits. No seizures or epileptiform discharges were seen throughout the recording. Charlsie QuestPriyanka O Yadav   CT HEAD CODE STROKE WO CONTRAST  Result Date: 03/23/2021 CLINICAL DATA:  Code stroke.  Left-sided weakness.  Aphasia. EXAM: CT HEAD WITHOUT CONTRAST TECHNIQUE: Contiguous axial images were obtained from the base of the skull through the vertex without intravenous contrast. COMPARISON:  11/08/2018 FINDINGS: Brain: Normal appearance without evidence of old or acute infarction, mass lesion, hemorrhage, hydrocephalus or extra-axial collection. Vascular: No abnormal vascular finding. Skull: Normal Sinuses/Orbits: Sinuses are clear. Old medial wall blowout fracture of the left orbit. Other: None ASPECTS (Alberta Stroke Program Early CT Score) - Ganglionic level infarction (caudate, lentiform nuclei, internal capsule, insula, M1-M3  cortex): 7 - Supraganglionic infarction (M4-M6 cortex): 3 Total score (0-10 with 10 being normal): 10 IMPRESSION: 1. Normal head CT. 2. ASPECTS is 10. 3. These results were communicated to Dr. Selina CooleyStack At 9:36 amon 4/28/2022by text page via the [facility] messaging system. Electronically Signed   By: Paulina FusiMark  Shogry M.D.   On: 03/23/2021 09:36   CT ANGIO HEAD CODE STROKE  Result Date: 03/23/2021 CLINICAL DATA:  Neuro deficit, acute stroke suspected. Left-sided weakness and aphasia. EXAM: CT ANGIOGRAPHY HEAD AND NECK TECHNIQUE: Multidetector CT imaging of the head and neck was performed using the standard protocol during bolus administration of intravenous contrast. Multiplanar CT image reconstructions and MIPs were obtained to evaluate the vascular anatomy. Carotid stenosis measurements (when applicable) are obtained utilizing NASCET criteria, using the distal internal carotid diameter as the denominator. CONTRAST:  75mL OMNIPAQUE IOHEXOL 350 MG/ML SOLN COMPARISON:  Same day CT head. FINDINGS: CTA NECK FINDINGS Aortic arch: Great vessel origins are patent. Right carotid system: No evidence of dissection, stenosis (50% or greater) or occlusion. Left carotid system: No evidence of dissection, stenosis (50% or greater) or occlusion. Streak artifact limits evaluation of the common carotid artery origin. Vertebral arteries: Co dominant. No evidence of significant stenosis. The left vertebral artery origin is largely obscured by streak artifact and therefore not well evaluated Skeleton: No acute abnormality on limited assessment. Other neck: No acute abnormality. Prominence of the tonsils and adenoids with resulting narrowed oropharyngeal airway. Upper chest: Visualized lung apices are clear. Review of the MIP  images confirms the above findings CTA HEAD FINDINGS Anterior circulation: No large vessel occlusion or proximal hemodynamically significant stenosis. No aneurysm. Posterior circulation: No large vessel occlusion or  proximal hemodynamically significant stenosis. Bilateral posterior communicating arteries. No aneurysm. Venous sinuses: As permitted by contrast timing, patent. Small left transverse sinus. Review of the MIP images confirms the above findings IMPRESSION: 1. Intracranially, no large vessel occlusion or proximal hemodynamically significant stenosis. 2. In the neck, no evidence of significant stenosis within the limitations detailed above. 3. Prominence of the tonsils and adenoids with resulting narrowed oropharyngeal airway, most likely related to hypertrophy. Recommend correlation with direct inspection. Findings could predispose to obstructive sleep apnea. Electronically Signed   By: Feliberto Harts MD   On: 03/23/2021 10:21   CT ANGIO NECK CODE STROKE  Result Date: 03/23/2021 CLINICAL DATA:  Neuro deficit, acute stroke suspected. Left-sided weakness and aphasia. EXAM: CT ANGIOGRAPHY HEAD AND NECK TECHNIQUE: Multidetector CT imaging of the head and neck was performed using the standard protocol during bolus administration of intravenous contrast. Multiplanar CT image reconstructions and MIPs were obtained to evaluate the vascular anatomy. Carotid stenosis measurements (when applicable) are obtained utilizing NASCET criteria, using the distal internal carotid diameter as the denominator. CONTRAST:  50mL OMNIPAQUE IOHEXOL 350 MG/ML SOLN COMPARISON:  Same day CT head. FINDINGS: CTA NECK FINDINGS Aortic arch: Great vessel origins are patent. Right carotid system: No evidence of dissection, stenosis (50% or greater) or occlusion. Left carotid system: No evidence of dissection, stenosis (50% or greater) or occlusion. Streak artifact limits evaluation of the common carotid artery origin. Vertebral arteries: Co dominant. No evidence of significant stenosis. The left vertebral artery origin is largely obscured by streak artifact and therefore not well evaluated Skeleton: No acute abnormality on limited assessment.  Other neck: No acute abnormality. Prominence of the tonsils and adenoids with resulting narrowed oropharyngeal airway. Upper chest: Visualized lung apices are clear. Review of the MIP images confirms the above findings CTA HEAD FINDINGS Anterior circulation: No large vessel occlusion or proximal hemodynamically significant stenosis. No aneurysm. Posterior circulation: No large vessel occlusion or proximal hemodynamically significant stenosis. Bilateral posterior communicating arteries. No aneurysm. Venous sinuses: As permitted by contrast timing, patent. Small left transverse sinus. Review of the MIP images confirms the above findings IMPRESSION: 1. Intracranially, no large vessel occlusion or proximal hemodynamically significant stenosis. 2. In the neck, no evidence of significant stenosis within the limitations detailed above. 3. Prominence of the tonsils and adenoids with resulting narrowed oropharyngeal airway, most likely related to hypertrophy. Recommend correlation with direct inspection. Findings could predispose to obstructive sleep apnea. Electronically Signed   By: Feliberto Harts MD   On: 03/23/2021 10:21    PHYSICAL EXAM Pleasant middle-age obese African-American lady not in distress.  . Afebrile. Head is nontraumatic. Neck is supple without bruit.    Cardiac exam no murmur or gallop. Lungs are clear to auscultation. Distal pulses are well felt. Neurological Exam ;  Awake  Alert oriented x 3.  Hypophonic slow deliberate l speech but no dysarthria or aphasia.  Follows commands well..eye movements full without nystagmus.fundi were not visualized. Vision acuity and fields appear normal. Hearing is normal. Palatal movements are normal. Face symmetric. Tongue midline. Normal strength, tone, reflexes and coordination but has very variable effort but improves with distractibility..  Subjective diminished left hemibody sensation but splits the midline and also splits vibration on the forehead.. Gait  deferred.  ASSESSMENT/PLAN [name] is a 35 y.o. female with history of  Asthma, blurred vision, migraine, depression, obesity Psuedotumor cerebri, and vertigo presenting with  Acute onset of left side weakness and aphasia.   Stroke like episode treated with IV tPA: A typical migraine vs functional symptoms  Code Stroke CT head Normal head CT. ASPECTS 10.    CTA head & neck :1. Intracranially, no large vessel occlusion or proximalhemodynamically significant stenosis.2. In the neck, no evidence of significant stenosis within thelimitations detailed above. 3. Prominence of the tonsils and adenoids with resulting narrowed oropharyngeal airway, most likely related to hypertrophy.  MRI  No acute intracranial abnormality identified. Unremarkable MRI of the brain.  2D Echo pending  LDL 91  HgbA1c 5.3  VTE prophylaxis - SCD's    Diet   Diet NPO time specified     No antithrombotic prior to admission, now on aspirin 81 mg daily  Therapy recommendations:  pending  Disposition:pending  Hypertension  Home meds:  none  Stable . Permissive hypertension (OK if < 220/120) but gradually normalize in 5-7 days . Long-term BP goal normotensive  Hyperlipidemia  Home meds: none  LDL 91, goal < 70  Diabetes type II Controlled  Home meds: none  HgbA1c 5.3, goal < 7.0  CBGs Recent Labs    03/23/21 0939  GLUCAP 74      Other Stroke Risk Factors  Obesity, Body mass index is 48.79 kg/m., BMI >/= 30 associated with increased stroke risk, recommend weight loss, diet and exercise as appropriate   Migraines   Other Active Problems  Hospital day # 1  Valentina Lucks, MSN, NP-C [facility] [phone] I have personally obtained history,examined this patient, reviewed notes, independently viewed imaging studies, participated in medical decision making and plan of care.ROS completed by me personally and pertinent positives fully documented  I have  made any additions or clarifications directly to the above note. Agree with note above.  Patient presented with sudden onset of left-sided numbness and right gaze preference and aphasia in the setting of Lexiscan stress test possibly atypical migraine episode versus functional etiology.  Brain imaging studies negative for stroke.  Continue ongoing stroke work-up.  Strict blood pressure control as per post tPA protocol and close neurological monitoring.  Mobilize out of bed.  Therapy consults.  Aspirin alone for stroke prevention and aggressive risk factor modification. This patient is critically ill and at significant risk of neurological worsening, death and care requires constant monitoring of vital signs, hemodynamics,respiratory and cardiac monitoring, extensive review of multiple databases, frequent neurological assessment, discussion with family, other specialists and medical decision making of high complexity.I have made any additions or clarifications directly to the above note.This critical care time does not reflect procedure time, or teaching time or supervisory time of PA/NP/Med Resident etc but could involve care discussion time.  I spent 30 minutes of neurocritical care time  in the care of  this patient.      Delia Heady, MD Medical Director [facility] Pager: [phone] 03/24/2021 1:34 PM   To contact Stroke Continuity provider, please refer to [email or web address]. After hours, contact General Neurology

## 2021-03-24 NOTE — Progress Notes (Signed)
PT Cancellation Note  Patient Details Name: Laurie Fields MRN: [id number] DOB: 1986/09/12   Cancelled Treatment:    Reason Eval/Treat Not Completed: Active bedrest order. Coordinated with RN to notify PT if bedrest orders are discharged later this date. Will follow-up as time permits.   Raymond Gurney, PT, DPT Acute Rehabilitation Services  Pager: [phone] Office: [phone]    Jewel Baize 03/24/2021, 8:03 AM

## 2021-03-24 NOTE — Progress Notes (Signed)
  Echocardiogram 2D Echocardiogram has been performed.  Laurie Fields 03/24/2021, 3:01 PM

## 2021-03-25 DIAGNOSIS — G43109 Migraine with aura, not intractable, without status migrainosus: Secondary | ICD-10-CM

## 2021-03-25 DIAGNOSIS — F449 Dissociative and conversion disorder, unspecified: Secondary | ICD-10-CM

## 2021-03-25 LAB — MRSA PCR SCREENING: MRSA by PCR: NEGATIVE

## 2021-03-25 LAB — RAPID URINE DRUG SCREEN, HOSP PERFORMED
Amphetamines: NOT DETECTED
Barbiturates: NOT DETECTED
Benzodiazepines: NOT DETECTED
Cocaine: NOT DETECTED
Opiates: NOT DETECTED
Tetrahydrocannabinol: NOT DETECTED

## 2021-03-25 NOTE — Progress Notes (Signed)
Occupational Therapy Treatment Patient Details Name: Laurie Fields MRN: [id number] DOB: 08-06-1986 Today's Date: 03/25/2021    History of present illness 35 yo female admitted 4/28 with L side weakness and hypophonic speech. s/p TPA. 4/28 MRI negative. PMH Asthma, Blurred vision, obesity depression migraine vertigo pseudotumor  cerebri DM2   OT comments  Pt was able to perform LB bathing seated and standing with min guard assist.  She was able to maintain Lt hand on RW while fully turned to Rt and Lt UE not in line of site.  She was also able to reach behind her back and move the washcloth from the Rt hand to the Lt hand (with both hands behind back) and no difficulty.  She intermittently struggled to bathe the lt buttocks and posterior thigh.  She repeatedly voices desire to discharge home and reports she can maneuver around her home in a w/c and that her friend can provide 24 hour assist if needed.  She does have 5 steps to enter which will be a challenge.  Will continue to follow.   Discharge recommendations updated for [facility] as rehab has declined, and she is refusing SNF, and only wants a home discharge.   Follow Up Recommendations  Home health OT;Supervision/Assistance - 24 hour    Equipment Recommendations  Wheelchair cushion (measurements OT);3 in 1 bedside commode;Tub/shower bench    Recommendations for Other Services      Precautions / Restrictions Precautions Precautions: Fall Restrictions Weight Bearing Restrictions: No       Mobility Bed Mobility Overal bed mobility: Needs Assistance Bed Mobility: Supine to Sit     Supine to sit: Supervision     General bed mobility comments: pt sitting up in chair    Transfers Overall transfer level: Needs assistance Equipment used: Rolling walker (2 wheeled) Transfers: Sit to/from Stand Sit to Stand: Min guard         General transfer comment: min guard for safety    Balance Overall balance assessment: Needs  assistance Sitting-balance support: Feet supported Sitting balance-Leahy Scale: Good Sitting balance - Comments: performed LB bathing seated   Standing balance support: Single extremity supported;During functional activity Standing balance-Leahy Scale: Poor Standing balance comment: min guard assist and Lt UE support while standing to bathe peri area                           ADL either performed or assessed with clinical judgement   ADL Overall ADL's : Needs assistance/impaired             Lower Body Bathing: Sit to/from stand;Min guard Lower Body Bathing Details (indicate cue type and reason): pt noted to repeatedly wash same area of legs several times                     Functional mobility during ADLs: Minimal assistance;Moderate assistance General ADL Comments: min guard for standing to bathe, and mod A for functional transfers     Vision   Additional Comments: not formally assessed; however, pt able to reach for and retrieve bathing supplies independently with no over shooting, or under shooting, and now squinting or apparent struggle to visually target the several items used   Perception     Praxis      Cognition Arousal/Alertness: Awake/alert Behavior During Therapy: WFL for tasks assessed/performed Overall Cognitive Status: Within Functional Limits for tasks assessed  General Comments: A&Ox4. Pt soft-spoken.        Exercises Other Exercises Other Exercises: Glasses no longer occluded upon OT entrance   Shoulder Instructions       General Comments VSS.  Pt voices eagerness to discharge home.  She reports she can use a w/c in her home, and that her son can carry her up the front steps    Pertinent Vitals/ Pain       Pain Assessment: Faces Faces Pain Scale: No hurt Pain Location: headache Pain Descriptors / Indicators: Headache Pain Intervention(s): Limited activity within patient's  tolerance;Monitored during session;Repositioned;RN gave pain meds during session  Home Living                                          Prior Functioning/Environment              Frequency  Min 2X/week        Progress Toward Goals  OT Goals(current goals can now be found in the care plan section)  Progress towards OT goals: Progressing toward goals  Acute Rehab OT Goals Patient Stated Goal: to be able to walk to toilet  Plan Discharge plan needs to be updated    Co-evaluation                 AM-PAC OT "6 Clicks" Daily Activity     Outcome Measure   Help from another person eating meals?: A Little Help from another person taking care of personal grooming?: A Little Help from another person toileting, which includes using toliet, bedpan, or urinal?: A Lot Help from another person bathing (including washing, rinsing, drying)?: A Little Help from another person to put on and taking off regular upper body clothing?: A Little Help from another person to put on and taking off regular lower body clothing?: A Lot 6 Click Score: 16    End of Session Equipment Utilized During Treatment: Rolling walker;Gait belt  OT Visit Diagnosis: Unsteadiness on feet (R26.81);Muscle weakness (generalized) (M62.81);Pain   Activity Tolerance Patient tolerated treatment well   Patient Left in chair;with call bell/phone within reach;with chair alarm set   Nurse Communication Mobility status        Time: 4193-7902 OT Time Calculation (min): 34 min  Charges: OT General Charges $OT Visit: 1 Visit OT Treatments $Self Care/Home Management : 23-37 mins  Eber Jones OTR/L Acute Rehabilitation Services Pager [phone] Office [phone]    Jeani Hawking M 03/25/2021, 1:07 PM

## 2021-03-25 NOTE — Consult Note (Addendum)
Ref: Mammie Lorenzo, MD   Subjective:  Feeling better. VS stable. Patient says vision is improving. Left sided weakness persist but able to hold left upper and lower extremities against gravity. Good LV systolic function on echocardiogram. Repeat CT head is normal. She is not a candidate for IP rehab and refuses SNF placement.  Objective:  Vital Signs in the last 24 hours: Temp:  [98.5 F (36.9 C)-99.3 F (37.4 C)] 98.9 F (37.2 C) (04/30 0800) Pulse Rate:  [61-88] 75 (04/30 0700) Cardiac Rhythm: Normal sinus rhythm (04/29 2000) Resp:  [0-27] 13 (04/30 0700) BP: (96-112)/(54-78) 105/72 (04/30 0700) SpO2:  [95 %-100 %] 97 % (04/30 0700)  Physical Exam: BP Readings from Last 1 Encounters:  03/25/21 105/72     Wt Readings from Last 1 Encounters:  03/23/21 133 kg    Weight change:  Body mass index is 48.79 kg/m. HEENT: [place]/AT, Eyes-Brown, Conjunctiva-Pink, Sclera-Non-icteric Neck: No JVD, No bruit, Trachea midline. Lungs:  Clear, Bilateral. Cardiac:  Regular rhythm, normal S1 and S2, no S3. II/VI systolic murmur. Abdomen:  Soft, non-tender. BS present. Extremities:  No edema present. No cyanosis. No clubbing. CNS: AxOx3, Cranial nerves grossly intact, moves all 4 extremities. Left sided weakness. Skin: Warm and dry.   Intake/Output from previous day: 04/29 0701 - 04/30 0700 In: 853.3 [I.V.:853.3] Out: 350 [Urine:350]    Lab Results: BMET    Component Value Date/Time   NA 142 03/23/2021 0949   NA 139 03/23/2021 0920   NA 140 12/05/2020 2157   K 4.2 03/23/2021 0949   K 4.0 03/23/2021 0920   K 3.6 12/05/2020 2157   CL 106 03/23/2021 0949   CL 105 03/23/2021 0920   CL 105 12/05/2020 2157   CO2 26 03/23/2021 0920   CO2 25 12/05/2020 2157   CO2 26 09/12/2020 2150   GLUCOSE 93 03/23/2021 0949   GLUCOSE 95 03/23/2021 0920   GLUCOSE 86 12/05/2020 2157   BUN 15 03/23/2021 0949   BUN 13 03/23/2021 0920   BUN 9 12/05/2020 2157   CREATININE 0.70 03/23/2021 0949    CREATININE 0.78 03/23/2021 0920   CREATININE 0.89 12/05/2020 2157   CALCIUM 8.9 03/23/2021 0920   CALCIUM 9.0 12/05/2020 2157   CALCIUM 8.7 (L) 09/12/2020 2150   GFRNONAA >60 03/23/2021 0920   GFRNONAA >60 12/05/2020 2157   GFRNONAA >60 09/12/2020 2150   GFRAA >60 11/08/2018 0112   GFRAA >60 05/20/2018 1605   GFRAA >60 01/05/2018 2108   CBC    Component Value Date/Time   WBC 8.6 03/23/2021 0920   RBC 4.57 03/23/2021 0920   HGB 13.6 03/23/2021 0949   HCT 40.0 03/23/2021 0949   PLT 361 03/23/2021 0920   MCV 88.8 03/23/2021 0920   MCH 28.4 03/23/2021 0920   MCHC 32.0 03/23/2021 0920   RDW 13.8 03/23/2021 0920   LYMPHSABS 1.7 03/23/2021 0920   MONOABS 0.4 03/23/2021 0920   EOSABS 0.2 03/23/2021 0920   BASOSABS 0.0 03/23/2021 0920   HEPATIC Function Panel Recent Labs    03/23/21 0920  PROT 7.6   HEMOGLOBIN A1C No components found for: HGA1C,  MPG CARDIAC ENZYMES No results found for: CKTOTAL, CKMB, CKMBINDEX, TROPONINI BNP No results for input(s): PROBNP in the last 8760 hours. TSH No results for input(s): TSH in the last 8760 hours. CHOLESTEROL Recent Labs    03/24/21 0808  CHOL 142    Scheduled Meds: .  stroke: mapping our early stages of recovery book   Does not  apply Once  . aspirin EC  81 mg Oral Daily  . Chlorhexidine Gluconate Cloth  6 each Topical Daily  . diphenhydrAMINE  12.5 mg Intravenous Once  . sodium chloride flush  3 mL Intravenous Once  . topiramate ER  200 mg Oral QHS   Continuous Infusions: . sodium chloride Stopped (03/24/21 1501)   PRN Meds:.acetaminophen **OR** acetaminophen (TYLENOL) oral liquid 160 mg/5 mL **OR** acetaminophen, senna-docusate  Assessment/Plan: Reversible ischemic neurologic deficit Chest pain CAD Morbid obesity Asthma H/O pseudotumor cerebri Migraine headache  Plan: Patient advised to resume activity with walker. Follow with neurology. Postpone cardiac cath for now.   LOS: 2 days   Time spent  including chart review, lab review, examination, discussion with patient : 30 min   Orpah Cobb  MD  03/25/2021, 10:34 AM

## 2021-03-25 NOTE — Progress Notes (Signed)
STROKE TEAM PROGRESS NOTE   INTERVAL HISTORY RNs at the bedside. Pt eager to go home but still with left sided weakness on exam, however, consistent with giveaway weakness. PT/OT will continue to work with her.   Vitals:   03/25/21 0100 03/25/21 0300 03/25/21 0400 03/25/21 0500  BP: 106/67 99/62 98/61  96/61  Pulse: 67 77 61 66  Resp: (!) 0 16 14 15   Temp:   98.9 F (37.2 C)   TempSrc:   Oral   SpO2: 96% 98% 96% 97%  Weight:      Height:       CBC:  Recent Labs  Lab 03/23/21 0920 03/23/21 0949  WBC 8.6  --   NEUTROABS 6.3  --   HGB 13.0 13.6  HCT 40.6 40.0  MCV 88.8  --   PLT 361  --    Basic Metabolic Panel:  Recent Labs  Lab 03/23/21 0920 03/23/21 0949  NA 139 142  K 4.0 4.2  CL 105 106  CO2 26  --   GLUCOSE 95 93  BUN 13 15  CREATININE 0.78 0.70  CALCIUM 8.9  --    Lipid Panel:  Recent Labs  Lab 03/24/21 0808  CHOL 142  TRIG 79  HDL 35*  CHOLHDL 4.1  VLDL 16  LDLCALC 91   HgbA1c:  Recent Labs  Lab 03/24/21 0808  HGBA1C 5.3   IMAGING past 24 hours  CT HEAD WO CONTRAST 03/24/2021 IMPRESSION:  Normal head CT.   ECHOCARDIOGRAM COMPLETE 03/24/2021 IMPRESSIONS   1. Left ventricular ejection fraction, by estimation, is 60 to 65%. The left ventricle has normal function. The left ventricle has no regional wall motion abnormalities. There is mild concentric left ventricular hypertrophy. Left ventricular diastolic parameters are consistent with Grade I diastolic dysfunction (impaired relaxation).   2. Right ventricular systolic function is normal. The right ventricular size is normal. There is normal pulmonary artery systolic pressure.   3. The mitral valve is normal in structure. No evidence of mitral valve regurgitation. No evidence of mitral stenosis.   4. The aortic valve is tricuspid. Aortic valve regurgitation is not visualized. No aortic stenosis is present.   5. The inferior vena cava is normal in size with greater than 50% respiratory  variability, suggesting right atrial pressure of 3 mmHg.   EEG 03/23/21 IMPRESSION: This study is within normal limits. No seizures or epileptiform discharges were seen throughout the recording.  PHYSICAL EXAM Pleasant middle-age obese African-American lady not in distress. Afebrile. Head is nontraumatic. Neck is supple without bruit.    Cardiac exam no murmur or gallop. Lungs are clear to auscultation. Distal pulses are well felt.  Neurological Exam ;  Awake  Alert oriented x 3.  Hypophonic slow speech but no dysarthria or aphasia.  Follows commands well. eye movements full without nystagmus. fundi were not visualized. Vision acuity and fields appear normal. Hearing is normal. Palatal movements are normal. Face symmetric. Tongue midline. BUE drift with left drifting within 5 secs to bed. Some improvement of left strength with distraction. Left hand grip weaker than right but effort related as she was able to eat breakfast without difficulty. LLE proximal 2/5 and distal 3/5, RLE proximal 3/5 and distal at least 4+/5. However, with b/l LE abduction and adduction, the strength are symmetrical bilaterally. Sensation subjectively decreased on the left but splits vibration on the forehead. Finger to nose not cooperative. Gait not tested.    ASSESSMENT/PLAN [name] is a 35 y.o. female with  history of asthma, blurred vision, migraine, depression, obesity, Psuedotumor cerebri, and vertigo presenting with  acute onset of left side weakness and aphasia.   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 ASPECTS 10.    CTA head & neck Prominence of the tonsils and adenoids with resulting narrowed oropharyngeal airway, most likely related to hypertrophy.  MRI - No acute intracranial abnormality identified. Unremarkable MRI of the brain.  2D Echo - EF 60 - 65%. No cardiac source of emboli identified.   EEG - 03/23/21 -  This study is within normal limits.   LDL 91  HgbA1c 5.3  VTE prophylaxis - SCD's  No antithrombotic prior to admission, now on aspirin 81 mg daily. Continue on discharge  Therapy recommendations:  CIR  Disposition - pending  BP management  Home meds:  none  Stable - mildly low at times  . Long-term BP goal normotensive  Lipid management  Home meds: none  LDL 91, goal < 100  No statin needed at this time  Other Stroke Risk Factors  Morbid obesity, Body mass index is 48.79 kg/m., BMI >/= 30 associated with increased stroke risk, recommend weight loss, diet and exercise as appropriate   Migraines - on Topamax  Pseudotumor cerebri - had been following with [facility] neurology, currently last follow-up  Other Active Problems  Hospital day # 2   Marvel Plan, MD PhD Stroke Neurology 03/25/2021 8:32 PM    To contact Stroke Continuity provider, please refer to [email or web address]. After hours, contact General Neurology

## 2021-03-25 NOTE — Progress Notes (Signed)
Physical Therapy Treatment Patient Details Name: Laurie Fields MRN: [id number] DOB: [birth year] Today's Date: 03/25/2021    History of Present Illness 35 yo female admitted 4/28 with L side weakness and hypophonic speech. s/p TPA. 4/28 MRI negative. PMH Asthma, Blurred vision, obesity depression migraine vertigo pseudotumor  cerebri DM2    PT Comments    Pt with improved coordination and AROM of L side this date, but still remains limited in mobility and at high risk for falls due to her impaired sensation and proprioception. Pt demonstrates and reports distrust of her L leg with gait, avoiding keeping the foot on the ground with transfers or with turns using a RW despite cues. This results in her needing min-modA during transfers and gait to steady the pt. Pt with very slow gait, demonstrating L knee instability, excessive trunk flexion, and poor motor planning with weight shifting and leg advancement. Pt benefits from looking at her L side to improve mobility facilitation. Pt with some inconsistencies with activation of her L UE and leg throughout. Will continue to follow acutely. Pt was denied by CIR due to all imaging being negative for acute processes. At this time, pt is needing physical assistance and is not functionally mobile to safely d/c home, thus if unable to go to CIR she may benefit from a short-term SNF stay for therapy to maximize safety and independence prior to return home. If pt progresses quickly then may be able to consider other options to allow pt to d/c home as she is very motivated to improve and return home.   Follow Up Recommendations  CIR;SNF (pt denied by CIR)     Equipment Recommendations  Rolling walker with 5" wheels;3in1 (PT)    Recommendations for Other Services       Precautions / Restrictions Precautions Precautions: Fall Restrictions Weight Bearing Restrictions: No    Mobility  Bed Mobility Overal bed mobility: Needs Assistance Bed  Mobility: Supine to Sit     Supine to sit: Supervision     General bed mobility comments: HOB elevated, supervision for safety with extra time and cues for pt to manage L leg off bed, pt ultimately using R UE to assist L leg off bed.    Transfers Overall transfer level: Needs assistance Equipment used: Rolling walker (2 wheeled) Transfers: Sit to/from Stand Sit to Stand: Mod assist;Min assist         General transfer comment: Sit > stand 1x from EOB and 1x from commode, cuing pt to push up with R hand on bed first rep then L hand on commode arm rest second rep then transition to RW, success. Pt with tendency to lift L foot off ground, despite cues, thereby resulting in staggeriung LOB and min-modA to steady the pt powering up to stand. Cues to extend hips and stand upright.  Ambulation/Gait Ambulation/Gait assistance: Mod assist;Min assist Gait Distance (Feet): 5 Feet (x2 bouts of ~4 ft > ~5 ft) Assistive device: Rolling walker (2 wheeled) Gait Pattern/deviations: Decreased step length - right;Decreased step length - left;Shuffle;Decreased weight shift to left;Decreased stance time - left;Decreased stride length;Decreased dorsiflexion - left;Trunk flexed Gait velocity: reduced Gait velocity interpretation: <1.31 ft/sec, indicative of household ambulator General Gait Details: Pt with tendency to flex at hips, needing repeated cues to extend hips for upright posture and to allow leg advancement. Cued pt to look at L leg and hand as needed throughout to improve facilitation of the limbs. 1x slip of L hand off RW grip, needing cues to  attend to and correct. First gait bout, performed lateral steps to L to bedside commode and second bout was anterior steps and turn to recliner. Pt with tendency to try to hop on R leg when turning rather than put L foot on ground and utilize it. Cues provided to shift weight either direction to allow contralateral leg advancement and to activate L leg  musculature during stance to avoid knee buckling, improved coordination and following of cues as distance progressed. Significantly slow gait. Min-modA for stability, especially with L stance, and to manage RW. Pt with poor follow through with cues to remain proximal to RW.   Stairs             Wheelchair Mobility    Modified Rankin (Stroke Patients Only) Modified Rankin (Stroke Patients Only) Pre-Morbid Rankin Score: No symptoms Modified Rankin: Moderately severe disability     Balance Overall balance assessment: Needs assistance Sitting-balance support: Feet supported Sitting balance-Leahy Scale: Fair     Standing balance support: Bilateral upper extremity supported;During functional activity Standing balance-Leahy Scale: Poor Standing balance comment: reliance on RW and cues for body aligment for safe standing                            Cognition Arousal/Alertness: Awake/alert Behavior During Therapy: WFL for tasks assessed/performed Overall Cognitive Status: Within Functional Limits for tasks assessed                                 General Comments: A&Ox4. Pt soft-spoken.      Exercises      General Comments General comments (skin integrity, edema, etc.): VSS      Pertinent Vitals/Pain Pain Assessment: Faces Faces Pain Scale: Hurts little more Pain Location: headache Pain Descriptors / Indicators: Headache Pain Intervention(s): Limited activity within patient's tolerance;Monitored during session;Repositioned;RN gave pain meds during session    Home Living                      Prior Function            PT Goals (current goals can now be found in the care plan section) Acute Rehab PT Goals Patient Stated Goal: to be able to walk to toilet PT Goal Formulation: With patient Time For Goal Achievement: 04/07/21 Potential to Achieve Goals: Good Progress towards PT goals: Progressing toward goals    Frequency     Min 4X/week      PT Plan Current plan remains appropriate    Co-evaluation              AM-PAC PT "6 Clicks" Mobility   Outcome Measure  Help needed turning from your back to your side while in a flat bed without using bedrails?: A Little Help needed moving from lying on your back to sitting on the side of a flat bed without using bedrails?: A Little Help needed moving to and from a bed to a chair (including a wheelchair)?: A Lot Help needed standing up from a chair using your arms (e.g., wheelchair or bedside chair)?: A Lot Help needed to walk in hospital room?: A Lot Help needed climbing 3-5 steps with a railing? : Total 6 Click Score: 13    End of Session Equipment Utilized During Treatment: Gait belt Activity Tolerance: Patient tolerated treatment well Patient left: in bed;Other (comment);with call bell/phone within reach (with OT) Nurse  Communication: Mobility status PT Visit Diagnosis: Unsteadiness on feet (R26.81);Other abnormalities of gait and mobility (R26.89);Muscle weakness (generalized) (M62.81);Difficulty in walking, not elsewhere classified (R26.2);Other symptoms and signs involving the nervous system (R29.898);Hemiplegia and hemiparesis Hemiplegia - Right/Left: Left Hemiplegia - dominant/non-dominant: Non-dominant Hemiplegia - caused by: Unspecified     Time: 3875-6433 PT Time Calculation (min) (ACUTE ONLY): 38 min  Charges:  $Gait Training: 23-37 mins $Therapeutic Activity: 8-22 mins                     Raymond Gurney, PT, DPT Acute Rehabilitation Services  Pager: [phone] Office: [phone]    Jewel Baize 03/25/2021, 12:34 PM

## 2021-03-26 ENCOUNTER — Encounter (HOSPITAL_COMMUNITY): Payer: Self-pay | Admitting: Neurology

## 2021-03-26 DIAGNOSIS — G932 Benign intracranial hypertension: Secondary | ICD-10-CM

## 2021-03-26 MED ORDER — VITAMIN B-12 1000 MCG PO TABS
1000.0000 ug | ORAL_TABLET | Freq: Every day | ORAL | Status: DC
  Administered 2021-03-26 – 2021-03-27 (×2): 1000 ug via ORAL
  Filled 2021-03-26 (×2): qty 1

## 2021-03-26 MED ORDER — VITAMIN D 25 MCG (1000 UNIT) PO TABS
1000.0000 [IU] | ORAL_TABLET | Freq: Every day | ORAL | Status: DC
  Administered 2021-03-26 – 2021-03-27 (×2): 1000 [IU] via ORAL
  Filled 2021-03-26 (×2): qty 1

## 2021-03-26 MED ORDER — TRAMADOL HCL 50 MG PO TABS
50.0000 mg | ORAL_TABLET | Freq: Once | ORAL | Status: AC
  Administered 2021-03-26: 50 mg via ORAL
  Filled 2021-03-26: qty 1

## 2021-03-26 MED ORDER — TRAMADOL HCL 50 MG PO TABS
50.0000 mg | ORAL_TABLET | Freq: Four times a day (QID) | ORAL | Status: DC | PRN
  Administered 2021-03-26: 50 mg via ORAL
  Filled 2021-03-26: qty 1

## 2021-03-26 NOTE — Consult Note (Addendum)
Ref: Mammie Lorenzo, MD   Subjective:  Slow and steady progress in ambulation.  Left sided weakness persist.  VS stable.  Objective:  Vital Signs in the last 24 hours: Temp:  [97.5 F (36.4 C)-98.8 F (37.1 C)] 98 F (36.7 C) (05/01 1229) Pulse Rate:  [58-69] 60 (05/01 1229) Cardiac Rhythm: Normal sinus rhythm (05/01 0700) Resp:  [18-20] 18 (05/01 1229) BP: (82-119)/(55-73) 105/60 (05/01 1229) SpO2:  [97 %-100 %] 100 % (05/01 1229)  Physical Exam: BP Readings from Last 1 Encounters:  03/26/21 105/60     Wt Readings from Last 1 Encounters:  03/23/21 133 kg    Weight change:  Body mass index is 48.79 kg/m. HEENT: [place]/AT, Eyes-Brown, PERL, EOMI, Conjunctiva-Pink, Sclera-Non-icteric Neck: No JVD, No bruit, Trachea midline. Lungs:  Clear, Bilateral. Cardiac:  Regular rhythm, normal S1 and S2, no S3. II/VI systolic murmur. Abdomen:  Soft, non-tender. BS present. Extremities:  No edema present. No cyanosis. No clubbing. CNS: AxOx3, Cranial nerves grossly intact, moves all 4 extremities. Left sided weakness. Very slow motion with rolling walker. Skin: Warm and dry.   Intake/Output from previous day: 04/30 0701 - 05/01 0700 In: -  Out: 950 [Urine:950]    Lab Results: BMET    Component Value Date/Time   NA 142 03/23/2021 0949   NA 139 03/23/2021 0920   NA 140 12/05/2020 2157   K 4.2 03/23/2021 0949   K 4.0 03/23/2021 0920   K 3.6 12/05/2020 2157   CL 106 03/23/2021 0949   CL 105 03/23/2021 0920   CL 105 12/05/2020 2157   CO2 26 03/23/2021 0920   CO2 25 12/05/2020 2157   CO2 26 09/12/2020 2150   GLUCOSE 93 03/23/2021 0949   GLUCOSE 95 03/23/2021 0920   GLUCOSE 86 12/05/2020 2157   BUN 15 03/23/2021 0949   BUN 13 03/23/2021 0920   BUN 9 12/05/2020 2157   CREATININE 0.70 03/23/2021 0949   CREATININE 0.78 03/23/2021 0920   CREATININE 0.89 12/05/2020 2157   CALCIUM 8.9 03/23/2021 0920   CALCIUM 9.0 12/05/2020 2157   CALCIUM 8.7 (L) 09/12/2020 2150    GFRNONAA >60 03/23/2021 0920   GFRNONAA >60 12/05/2020 2157   GFRNONAA >60 09/12/2020 2150   GFRAA >60 11/08/2018 0112   GFRAA >60 05/20/2018 1605   GFRAA >60 01/05/2018 2108   CBC    Component Value Date/Time   WBC 8.6 03/23/2021 0920   RBC 4.57 03/23/2021 0920   HGB 13.6 03/23/2021 0949   HCT 40.0 03/23/2021 0949   PLT 361 03/23/2021 0920   MCV 88.8 03/23/2021 0920   MCH 28.4 03/23/2021 0920   MCHC 32.0 03/23/2021 0920   RDW 13.8 03/23/2021 0920   LYMPHSABS 1.7 03/23/2021 0920   MONOABS 0.4 03/23/2021 0920   EOSABS 0.2 03/23/2021 0920   BASOSABS 0.0 03/23/2021 0920   HEPATIC Function Panel Recent Labs    03/23/21 0920  PROT 7.6   HEMOGLOBIN A1C No components found for: HGA1C,  MPG CARDIAC ENZYMES No results found for: CKTOTAL, CKMB, CKMBINDEX, TROPONINI BNP No results for input(s): PROBNP in the last 8760 hours. TSH No results for input(s): TSH in the last 8760 hours. CHOLESTEROL Recent Labs    03/24/21 0808  CHOL 142    Scheduled Meds: .  stroke: mapping our early stages of recovery book   Does not apply Once  . aspirin EC  81 mg Oral Daily  . Chlorhexidine Gluconate Cloth  6 each Topical Daily  . cholecalciferol  1,000 Units Oral Daily  . diphenhydrAMINE  12.5 mg Intravenous Once  . sodium chloride flush  3 mL Intravenous Once  . topiramate ER  200 mg Oral QHS  . vitamin B-12  1,000 mcg Oral Daily   Continuous Infusions: . sodium chloride Stopped (03/24/21 1501)   PRN Meds:.acetaminophen **OR** acetaminophen (TYLENOL) oral liquid 160 mg/5 mL **OR** acetaminophen, senna-docusate  Assessment/Plan: Reversible ischemic neurologic deficit Chest pain CAD Morbid obesity Asthma H/O pseudotumor cerebri Migraine headache  Increase ambulation. SNF v/s home PT.   LOS: 3 days   Time spent including chart review, lab review, examination, discussion with patient/family/Nurse : 30 min   Orpah Cobb  MD  03/26/2021, 1:10 PM

## 2021-03-26 NOTE — Progress Notes (Signed)
Physical Therapy Treatment Patient Details Name: Laurie Fields MRN: [id number] DOB: March 25, 1986 Today's Date: 03/26/2021    History of Present Illness 35 yo female admitted 4/28 with L side weakness and hypophonic speech. s/p TPA. 4/28 MRI negative. PMH Asthma, Blurred vision, obesity depression migraine vertigo pseudotumor  cerebri DM2    PT Comments    Pt ambulating a significantly increased distance of up to ~80 ft this date with min guard-A utilizing a RW, displaying only x1 LOB needing minA to recover. This would allow her to be able to negotiate short distances around her house, but she still displays difficulty turning or avoiding obstacles and does not appear to be able to negotiate stairs yet without significant assistance, limiting her ability to safely enter/exit her home. Pt demonstrates inconsistencies in L foot clearance and step length throughout, with noted improved proprioception as no concerns for placement this date even though pt reports it still feels numb. Pt with improved gait pattern when RW was pulled anteriorly by PT to encourage faster gait. If pt can display an ability to negotiate stairs safely with less assistance to allow entry/exit of her home then she could benefit from HHPT follow-up, otherwise she may need short term rehab at a SNF prior to return home. Will continue to follow acutely.   Follow Up Recommendations  SNF;Home health PT (depending on ability to enter/exit home and level of (A) available)     Equipment Recommendations  Rolling walker with 5" wheels;3in1 (PT)    Recommendations for Other Services       Precautions / Restrictions Precautions Precautions: Fall Restrictions Weight Bearing Restrictions: No    Mobility  Bed Mobility Overal bed mobility: Needs Assistance Bed Mobility: Supine to Sit     Supine to sit: Supervision     General bed mobility comments: HOB elevated, supervision for safety with extra time and cues for pt  to manage L leg off bed, pt ultimately using R UE to assist L leg off bed.    Transfers Overall transfer level: Needs assistance Equipment used: Rolling walker (2 wheeled) Transfers: Sit to/from Stand Sit to Stand: Min assist         General transfer comment: Sit > stand 1x from EOB, cuing pt to push up with R hand on bed. Cued pt to keep bil feet on ground, with success this date. x2 attempts before success with minA to steady.  Ambulation/Gait Ambulation/Gait assistance: Min assist;Min guard Gait Distance (Feet): 80 Feet Assistive device: Rolling walker (2 wheeled) Gait Pattern/deviations: Decreased step length - right;Decreased step length - left;Shuffle;Decreased weight shift to left;Decreased stance time - left;Decreased stride length;Decreased dorsiflexion - left;Trunk flexed Gait velocity: reduced Gait velocity interpretation: <1.8 ft/sec, indicate of risk for recurrent falls (once PT pushed RW for pt to keep up with) General Gait Details: Pt with tendency to flex at hips, needing repeated cues to extend hips for upright posture and to allow leg advancement. Also, cued pt to remain midline in RW with her displaying tendency to bias the R side of RW and flex trunk laterally to R. Cues to look anteriorly. Significantly slow gait, cuing to increase with pt able to do so slightly, thus provided manual guiding of RW anteriorly to facilitate faster pace and improved neuro pattern of gait, success. Cues to exaggerate L marching to improve step length and foot clearance, success. Min guard-A for stability with chair follow by her brother. Inconsistencies noted with L foot advancement, stability in stance, and foot clearance throughout.  x1 LOB needing minA to recover.   Stairs             Wheelchair Mobility    Modified Rankin (Stroke Patients Only) Modified Rankin (Stroke Patients Only) Pre-Morbid Rankin Score: No symptoms Modified Rankin: Moderately severe disability      Balance Overall balance assessment: Needs assistance Sitting-balance support: Feet supported Sitting balance-Leahy Scale: Fair     Standing balance support: Bilateral upper extremity supported;During functional activity;No upper extremity supported Standing balance-Leahy Scale: Fair Standing balance comment: Reliance on RW for mobility but able to stand for brief periods without UE support.                            Cognition Arousal/Alertness: Awake/alert Behavior During Therapy: WFL for tasks assessed/performed Overall Cognitive Status: Within Functional Limits for tasks assessed                                 General Comments: A&Ox4. Pt soft-spoken.      Exercises      General Comments General comments (skin integrity, edema, etc.): VSS; educated pt that in order to go home she needs to be able to negotiate stairs safely with less assistance      Pertinent Vitals/Pain Pain Assessment: 0-10 Pain Score: 3  Pain Location: headache Pain Descriptors / Indicators: Headache Pain Intervention(s): Limited activity within patient's tolerance;Monitored during session;Repositioned    Home Living                      Prior Function            PT Goals (current goals can now be found in the care plan section) Acute Rehab PT Goals Patient Stated Goal: to go home PT Goal Formulation: With patient Time For Goal Achievement: 04/07/21 Potential to Achieve Goals: Good Progress towards PT goals: Progressing toward goals    Frequency    Min 4X/week      PT Plan Discharge plan needs to be updated    Co-evaluation              AM-PAC PT "6 Clicks" Mobility   Outcome Measure  Help needed turning from your back to your side while in a flat bed without using bedrails?: A Little Help needed moving from lying on your back to sitting on the side of a flat bed without using bedrails?: A Little Help needed moving to and from a bed to a  chair (including a wheelchair)?: A Little Help needed standing up from a chair using your arms (e.g., wheelchair or bedside chair)?: A Little Help needed to walk in hospital room?: A Little Help needed climbing 3-5 steps with a railing? : Total 6 Click Score: 16    End of Session Equipment Utilized During Treatment: Gait belt Activity Tolerance: Patient tolerated treatment well Patient left: with call bell/phone within reach;in chair;with chair alarm set;with family/visitor present Nurse Communication: Mobility status PT Visit Diagnosis: Unsteadiness on feet (R26.81);Other abnormalities of gait and mobility (R26.89);Muscle weakness (generalized) (M62.81);Difficulty in walking, not elsewhere classified (R26.2);Other symptoms and signs involving the nervous system (R29.898);Hemiplegia and hemiparesis Hemiplegia - Right/Left: Left Hemiplegia - dominant/non-dominant: Non-dominant Hemiplegia - caused by: Unspecified     Time: 5366-4403 PT Time Calculation (min) (ACUTE ONLY): 33 min  Charges:  $Gait Training: 8-22 mins $Therapeutic Activity: 8-22 mins  Raymond Gurney, PT, DPT Acute Rehabilitation Services  Pager: [phone] Office: [phone]    Jewel Baize 03/26/2021, 10:57 AM

## 2021-03-26 NOTE — Progress Notes (Signed)
Inpatient Rehab Admissions Coordinator:    Per therapy recommendations, pt was screened for CIR candidacy by Megan Salon, SLP.  At this time all imaging negative for acute process and I do not recommend CIR consult at this time. Please contact me with any questions.  Megan Salon, MS, CCC-SLP Rehab Admissions Coordinator  [phone] (celll) [phone] (office)

## 2021-03-26 NOTE — Plan of Care (Signed)
  Problem: Education: Goal: Knowledge of disease or condition will improve Outcome: Progressing Goal: Knowledge of secondary prevention will improve Outcome: Progressing Goal: Knowledge of patient specific risk factors addressed and post discharge goals established will improve Outcome: Progressing   Problem: Education: Goal: Knowledge of General Education information will improve Description: Including pain rating scale, medication(s)/side effects and non-pharmacologic comfort measures Outcome: Progressing   Problem: Health Behavior/Discharge Planning: Goal: Ability to manage health-related needs will improve Outcome: Progressing   Problem: Clinical Measurements: Goal: Ability to maintain clinical measurements within normal limits will improve Outcome: Progressing Goal: Will remain free from infection Outcome: Progressing Goal: Diagnostic test results will improve Outcome: Progressing Goal: Respiratory complications will improve Outcome: Progressing Goal: Cardiovascular complication will be avoided Outcome: Progressing   Problem: Activity: Goal: Risk for activity intolerance will decrease Outcome: Progressing   Problem: Nutrition: Goal: Adequate nutrition will be maintained Outcome: Progressing   Problem: Coping: Goal: Level of anxiety will decrease Outcome: Progressing   Problem: Elimination: Goal: Will not experience complications related to bowel motility Outcome: Progressing Goal: Will not experience complications related to urinary retention Outcome: Progressing   Problem: Pain Managment: Goal: General experience of comfort will improve Outcome: Progressing   Problem: Safety: Goal: Ability to remain free from injury will improve Outcome: Progressing   Problem: Skin Integrity: Goal: Risk for impaired skin integrity will decrease Outcome: Progressing   

## 2021-03-26 NOTE — Progress Notes (Signed)
STROKE TEAM PROGRESS NOTE   INTERVAL HISTORY RNs at the bedside. Pt wants to go home but still exhibit left sided weakness. However, she was able to walk to sink by herself per pt and RN. However, she was told not to get out of bed so she said she peed on herself. She requesting to go home otherwise she will need mental rehab as she was depressed here and can not afford expensive hospital bill. I had extensive discussion with her that we want her to go home safely, not falling at home. She worked with PT this am and seems improved and currently recommend SNF vs. HH PT. I discussed with pt that if she continues to improve tomorrow, she has a good chance to go home tomorrow with [facility]. She agrees.    Vitals:   03/26/21 0327 03/26/21 0900 03/26/21 1229 03/26/21 1519  BP: 100/63 103/73 105/60 111/70  Pulse: (!) 58 69 60 66  Resp: 18 20 18 19   Temp: (!) 97.5 F (36.4 C) 98 F (36.7 C) 98 F (36.7 C) 98 F (36.7 C)  TempSrc: Oral Oral Oral Oral  SpO2: 100% 100% 100% 100%  Weight:      Height:       CBC:  Recent Labs  Lab 03/23/21 0920 03/23/21 0949  WBC 8.6  --   NEUTROABS 6.3  --   HGB 13.0 13.6  HCT 40.6 40.0  MCV 88.8  --   PLT 361  --    Basic Metabolic Panel:  Recent Labs  Lab 03/23/21 0920 03/23/21 0949  NA 139 142  K 4.0 4.2  CL 105 106  CO2 26  --   GLUCOSE 95 93  BUN 13 15  CREATININE 0.78 0.70  CALCIUM 8.9  --    Lipid Panel:  Recent Labs  Lab 03/24/21 0808  CHOL 142  TRIG 79  HDL 35*  CHOLHDL 4.1  VLDL 16  LDLCALC 91   HgbA1c:  Recent Labs  Lab 03/24/21 0808  HGBA1C 5.3   IMAGING past 24 hours  CT HEAD WO CONTRAST 03/24/2021 IMPRESSION:  Normal head CT.   ECHOCARDIOGRAM COMPLETE 03/24/2021 IMPRESSIONS   1. Left ventricular ejection fraction, by estimation, is 60 to 65%. The left ventricle has normal function. The left ventricle has no regional wall motion abnormalities. There is mild concentric left ventricular hypertrophy. Left ventricular  diastolic parameters are consistent with Grade I diastolic dysfunction (impaired relaxation).   2. Right ventricular systolic function is normal. The right ventricular size is normal. There is normal pulmonary artery systolic pressure.   3. The mitral valve is normal in structure. No evidence of mitral valve regurgitation. No evidence of mitral stenosis.   4. The aortic valve is tricuspid. Aortic valve regurgitation is not visualized. No aortic stenosis is present.   5. The inferior vena cava is normal in size with greater than 50% respiratory variability, suggesting right atrial pressure of 3 mmHg.   EEG 03/23/21 IMPRESSION: This study is within normal limits. No seizures or epileptiform discharges were seen throughout the recording.  PHYSICAL EXAM Pleasant middle-age obese African-American lady not in distress. Afebrile. Head is nontraumatic. Neck is supple without bruit.    Cardiac exam no murmur or gallop. Lungs are clear to auscultation. Distal pulses are well felt.  Neurological Exam Awake  Alert oriented x 3.  Hypophonic slow speech but no dysarthria or aphasia.  Follows commands well. eye movements full without nystagmus. fundi were not visualized. Vision acuity and fields  appear normal. Hearing is normal. Palatal movements are normal. Face symmetric. Tongue midline. BUE drift with left drifting within 5 secs to bed. Some improvement of left strength with distraction. Left hand grip weaker than right but effort related as she was able to eat breakfast without difficulty. LLE proximal 2/5 and distal 3/5, RLE proximal 3/5 and distal at least 4+/5. However, with b/l LE abduction and adduction, the strength are symmetrical bilaterally. Sensation subjectively decreased on the left but splits vibration on the forehead. Finger to nose not cooperative. Gait not tested.    ASSESSMENT/PLAN Ms. Dayrin Stallone is a 35 y.o. female with history of asthma, blurred vision, migraine, depression,  obesity, Psuedotumor cerebri, and vertigo presenting with  acute onset of left side weakness and aphasia.   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 ASPECTS 10.    CTA head & neck Prominence of the tonsils and adenoids with resulting narrowed oropharyngeal airway, most likely related to hypertrophy.  MRI - No acute intracranial abnormality identified. Unremarkable MRI of the brain.  2D Echo - EF 60 - 65%. No cardiac source of emboli identified.   EEG - 03/23/21 - This study is within normal limits.   LDL 91  HgbA1c 5.3  VTE prophylaxis - SCD's  No antithrombotic prior to admission, now on aspirin 81 mg daily. Continue on discharge  Therapy recommendations:  SNF vs HH PT  Disposition - pending  BP management  Home meds:  none  Stable - mildly low at times  . Long-term BP goal normotensive  Lipid management  Home meds: none  LDL 91, goal < 100  No statin needed at this time  Other Stroke Risk Factors  Morbid obesity, Body mass index is 48.79 kg/m., BMI >/= 30 associated with increased stroke risk, recommend weight loss, diet and exercise as appropriate   Migraines - on Topamax  Pseudotumor cerebri - had been following with [facility] neurology, currently last follow-up  Other Active Problems  Hospital day # 3   Marvel Plan, MD PhD Stroke Neurology 03/26/2021 7:54 PM     To contact Stroke Continuity provider, please refer to [email or web address]. After hours, contact General Neurology

## 2021-03-26 NOTE — Plan of Care (Signed)
  Problem: Education: Goal: Knowledge of disease or condition will improve 03/26/2021 0057 by Treasa School, RN Outcome: Progressing 03/26/2021 0057 by Treasa School, RN Outcome: Progressing Goal: Knowledge of secondary prevention will improve 03/26/2021 0057 by Treasa School, RN Outcome: Progressing 03/26/2021 0057 by Treasa School, RN Outcome: Progressing Goal: Knowledge of patient specific risk factors addressed and post discharge goals established will improve 03/26/2021 0057 by Treasa School, RN Outcome: Progressing 03/26/2021 0057 by Treasa School, RN Outcome: Progressing   Problem: Education: Goal: Knowledge of General Education information will improve Description: Including pain rating scale, medication(s)/side effects and non-pharmacologic comfort measures Outcome: Progressing   Problem: Health Behavior/Discharge Planning: Goal: Ability to manage health-related needs will improve Outcome: Progressing   Problem: Clinical Measurements: Goal: Ability to maintain clinical measurements within normal limits will improve Outcome: Progressing Goal: Will remain free from infection Outcome: Progressing Goal: Diagnostic test results will improve Outcome: Progressing Goal: Respiratory complications will improve Outcome: Progressing Goal: Cardiovascular complication will be avoided Outcome: Progressing   Problem: Activity: Goal: Risk for activity intolerance will decrease Outcome: Progressing   Problem: Nutrition: Goal: Adequate nutrition will be maintained Outcome: Progressing   Problem: Coping: Goal: Level of anxiety will decrease Outcome: Progressing   Problem: Elimination: Goal: Will not experience complications related to bowel motility Outcome: Progressing Goal: Will not experience complications related to urinary retention Outcome: Progressing   Problem: Pain Managment: Goal: General experience of comfort will improve Outcome:  Progressing   Problem: Safety: Goal: Ability to remain free from injury will improve Outcome: Progressing   Problem: Skin Integrity: Goal: Risk for impaired skin integrity will decrease Outcome: Progressing

## 2021-03-27 DIAGNOSIS — Z9282 Status post administration of tPA (rtPA) in a different facility within the last 24 hours prior to admission to current facility: Secondary | ICD-10-CM

## 2021-03-27 MED ORDER — SENNOSIDES-DOCUSATE SODIUM 8.6-50 MG PO TABS: 1.0000 | ORAL_TABLET | Freq: Every evening | ORAL | Status: AC | PRN

## 2021-03-27 MED ORDER — VITAMIN D3 25 MCG PO TABS: 1000.0000 [IU] | ORAL_TABLET | Freq: Every day | ORAL | Status: AC

## 2021-03-27 MED ORDER — TRAMADOL HCL 50 MG PO TABS: 50.0000 mg | ORAL_TABLET | Freq: Four times a day (QID) | ORAL | 0 refills | Status: AC | PRN

## 2021-03-27 MED ORDER — TOPIRAMATE ER 200 MG PO SPRINKLE CAP24: 200.0000 mg | EXTENDED_RELEASE_CAPSULE | Freq: Every day | ORAL | 0 refills | Status: AC

## 2021-03-27 MED ORDER — ASPIRIN 81 MG PO TBEC: 81.0000 mg | DELAYED_RELEASE_TABLET | Freq: Every day | ORAL | 11 refills | Status: AC

## 2021-03-27 MED ORDER — CYANOCOBALAMIN 1000 MCG PO TABS: 1000.0000 ug | ORAL_TABLET | Freq: Every day | ORAL | 2 refills | Status: AC

## 2021-03-27 NOTE — Care Management Important Message (Signed)
Important Message  Patient Details  Name: Laurie Fields MRN: [id number] Date of Birth: 28-Apr-1986   Medicare Important Message Given:  Yes     Laurie Fields Stefan Church 03/27/2021, 4:27 PM

## 2021-03-27 NOTE — TOC Transition Note (Signed)
Transition of Care [facility]) - CM/SW Discharge Note   Patient Details  Name: [name] MRN: [id number] Date of Birth: 03-Aug-1986  Transition of Care [facility]) CM/SW Contact:  Kermit Balo, RN Phone Number: 03/27/2021, 11:38 AM   Clinical Narrative:    Patient is from home with teenage boys. 13, 14, 17. She states her brother and his wife and her children's father and his wife are also going to provide assistance at home.  Pt prefers to d/c home and feels she will have the assistance needed. She has no preference for [facility] services. CM arranged HH through [facility].  Ordered DME to be delivered to the room per [employer].  Pt states she will have transport home when d/ced and no issues with transportation once home.    Final next level of care: Home w Home Health Services Barriers to Discharge: No Barriers Identified   Patient Goals and CMS Choice   CMS Medicare.gov Compare Post Acute Care list provided to:: Patient Choice offered to / list presented to : Patient  Discharge Placement                       Discharge Plan and Services                DME Arranged: 3-N-1,Walker rolling DME Agency: [employer] Date DME Agency Contacted: 03/27/21   Representative spoke with at DME Agency: [facility] HH Arranged: PT,OT,Speech Therapy HH Agency: [facility] Date [facility] Agency Contacted: 03/27/21   Representative spoke with at [facility] Agency: Kandee Keen  Social Determinants of Health (SDOH) Interventions     Readmission Risk Interventions No flowsheet data found.

## 2021-03-27 NOTE — Consult Note (Signed)
Ref: Mammie Lorenzo, MD   Subjective:  Awake. Talking to disability office. VS stable.  Objective:  Vital Signs in the last 24 hours: Temp:  [98 F (36.7 C)-98.5 F (36.9 C)] 98.4 F (36.9 C) (05/02 1106) Pulse Rate:  [60-73] 73 (05/02 1106) Cardiac Rhythm: Normal sinus rhythm (05/02 0853) Resp:  [16-20] 20 (05/02 1106) BP: (101-116)/(50-70) 102/68 (05/02 1106) SpO2:  [99 %-100 %] 100 % (05/02 1106)  Physical Exam: BP Readings from Last 1 Encounters:  03/27/21 102/68     Wt Readings from Last 1 Encounters:  03/23/21 133 kg    Weight change:  Body mass index is 48.79 kg/m. HEENT: [place]/AT, Eyes-Brown, wears glasses, Conjunctiva-Pink, Sclera-Non-icteric Neck: No JVD, No bruit, Trachea midline. Lungs:  Clear, Bilateral. Cardiac:  Regular rhythm, normal S1 and S2, no S3. II/VI systolic murmur. Abdomen:  Soft, non-tender. BS present. Extremities:  No edema present. No cyanosis. No clubbing. CNS: AxOx3, Cranial nerves grossly intact, moves all 4 extremities. Left sided weakness persist. Ambulation with RW and some support per PT Skin: Warm and dry.   Intake/Output from previous day: 05/01 0701 - 05/02 0700 In: 260 [P.O.:260] Out: 1000 [Urine:1000]    Lab Results: BMET    Component Value Date/Time   NA 142 03/23/2021 0949   NA 139 03/23/2021 0920   NA 140 12/05/2020 2157   K 4.2 03/23/2021 0949   K 4.0 03/23/2021 0920   K 3.6 12/05/2020 2157   CL 106 03/23/2021 0949   CL 105 03/23/2021 0920   CL 105 12/05/2020 2157   CO2 26 03/23/2021 0920   CO2 25 12/05/2020 2157   CO2 26 09/12/2020 2150   GLUCOSE 93 03/23/2021 0949   GLUCOSE 95 03/23/2021 0920   GLUCOSE 86 12/05/2020 2157   BUN 15 03/23/2021 0949   BUN 13 03/23/2021 0920   BUN 9 12/05/2020 2157   CREATININE 0.70 03/23/2021 0949   CREATININE 0.78 03/23/2021 0920   CREATININE 0.89 12/05/2020 2157   CALCIUM 8.9 03/23/2021 0920   CALCIUM 9.0 12/05/2020 2157   CALCIUM 8.7 (L) 09/12/2020 2150   GFRNONAA  >60 03/23/2021 0920   GFRNONAA >60 12/05/2020 2157   GFRNONAA >60 09/12/2020 2150   GFRAA >60 11/08/2018 0112   GFRAA >60 05/20/2018 1605   GFRAA >60 01/05/2018 2108   CBC    Component Value Date/Time   WBC 8.6 03/23/2021 0920   RBC 4.57 03/23/2021 0920   HGB 13.6 03/23/2021 0949   HCT 40.0 03/23/2021 0949   PLT 361 03/23/2021 0920   MCV 88.8 03/23/2021 0920   MCH 28.4 03/23/2021 0920   MCHC 32.0 03/23/2021 0920   RDW 13.8 03/23/2021 0920   LYMPHSABS 1.7 03/23/2021 0920   MONOABS 0.4 03/23/2021 0920   EOSABS 0.2 03/23/2021 0920   BASOSABS 0.0 03/23/2021 0920   HEPATIC Function Panel Recent Labs    03/23/21 0920  PROT 7.6   HEMOGLOBIN A1C No components found for: HGA1C,  MPG CARDIAC ENZYMES No results found for: CKTOTAL, CKMB, CKMBINDEX, TROPONINI BNP No results for input(s): PROBNP in the last 8760 hours. TSH No results for input(s): TSH in the last 8760 hours. CHOLESTEROL Recent Labs    03/24/21 0808  CHOL 142    Scheduled Meds: .  stroke: mapping our early stages of recovery book   Does not apply Once  . aspirin EC  81 mg Oral Daily  . Chlorhexidine Gluconate Cloth  6 each Topical Daily  . cholecalciferol  1,000 Units Oral Daily  .  diphenhydrAMINE  12.5 mg Intravenous Once  . sodium chloride flush  3 mL Intravenous Once  . topiramate ER  200 mg Oral QHS  . vitamin B-12  1,000 mcg Oral Daily   Continuous Infusions: . sodium chloride Stopped (03/24/21 1501)   PRN Meds:.acetaminophen **OR** acetaminophen (TYLENOL) oral liquid 160 mg/5 mL **OR** acetaminophen, senna-docusate, traMADol  Assessment/Plan: Reversible ischemic neurologic deficit Chest pain CAD Morbid obesity Asthma H/O pseudotumor cerebri Migraine headache Depression  Increase activity. F/U in 2 weeks.   LOS: 4 days   Time spent including chart review, lab review, examination, discussion with patient/Nurse : 30 min   Orpah Cobb  MD  03/27/2021, 1:05 PM

## 2021-03-27 NOTE — Discharge Summary (Addendum)
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   albuterol 108 (90 Base) MCG/ACT inhaler Commonly known as: VENTOLIN HFA Inhale 2 puffs into the lungs every 4 (four) hours as needed for wheezing or shortness of breath.   aspirin 81 MG EC tablet Take 1 tablet (81 mg total) by mouth daily. Swallow whole. Start taking on: Mar 28, 2021   cyanocobalamin 1000 MCG tablet Take 1 tablet (1,000 mcg total) by mouth daily. Start taking on: Mar 28, 2021   senna-docusate 8.6-50 MG tablet Commonly known as: Senokot-S Take 1 tablet by mouth at bedtime as needed for moderate constipation.   topiramate ER 200 MG Cs24 sprinkle capsule Commonly known as: QUDEXY XR Take 1 capsule (200 mg total) by mouth at bedtime. Replaces: Topiramate ER 200 MG Cp24   traMADol 50 MG tablet Commonly known as: ULTRAM Take 1 tablet (50 mg total) by mouth every 6 (six) hours as needed for moderate pain.   Vitamin D3 25 MCG tablet Commonly known as: Vitamin D Take 1 tablet (1,000 Units total)  by mouth daily. Start taking on: Mar 28, 2021            Durable Medical Equipment  (From admission, onward)         Start     Ordered   03/27/21 1132  For home use only DME 3 n 1  Once        03/27/21 1132   03/27/21 1132  For home use only DME Walker rolling  Once       Question Answer Comment  Walker: With 5 Inch Wheels   Patient needs a walker to treat with the following condition Stroke (HCC)      03/27/21 1132          LABORATORY STUDIES CBC    Component Value Date/Time   WBC 8.6 03/23/2021 0920   RBC 4.57 03/23/2021 0920   HGB 13.6 03/23/2021 0949   HCT 40.0 03/23/2021 0949   PLT 361 03/23/2021 0920   MCV 88.8 03/23/2021 0920   MCH 28.4 03/23/2021 0920   MCHC 32.0 03/23/2021 0920   RDW 13.8 03/23/2021 0920   LYMPHSABS 1.7 03/23/2021 0920   MONOABS 0.4 03/23/2021 0920   EOSABS 0.2 03/23/2021 0920   BASOSABS 0.0 03/23/2021 0920   CMP    Component Value Date/Time   NA 142 03/23/2021 0949   K 4.2 03/23/2021 0949   CL 106 03/23/2021 0949   CO2 26 03/23/2021 0920  GLUCOSE 93 03/23/2021 0949   BUN 15 03/23/2021 0949   CREATININE 0.70 03/23/2021 0949   CALCIUM 8.9 03/23/2021 0920   PROT 7.6 03/23/2021 0920   ALBUMIN 3.8 03/23/2021 0920   AST 18 03/23/2021 0920   ALT 8 03/23/2021 0920   ALKPHOS 65 03/23/2021 0920   BILITOT 0.8 03/23/2021 0920   GFRNONAA >60 03/23/2021 0920   GFRAA >60 11/08/2018 0112   COAGS Lab Results  Component Value Date   INR 1.0 03/23/2021   Lipid Panel    Component Value Date/Time   CHOL 142 03/24/2021 0808   TRIG 79 03/24/2021 0808   HDL 35 (L) 03/24/2021 0808   CHOLHDL 4.1 03/24/2021 0808   VLDL 16 03/24/2021 0808   LDLCALC 91 03/24/2021 0808   HgbA1C  Lab Results  Component Value Date   HGBA1C 5.3 03/24/2021   Urinalysis    Component Value Date/Time   COLORURINE YELLOW 09/12/2020 2107   APPEARANCEUR CLEAR 09/12/2020 2107   LABSPEC 1.030 09/12/2020 2107   PHURINE 5.0 09/12/2020 2107   GLUCOSEU NEGATIVE  09/12/2020 2107   HGBUR NEGATIVE 09/12/2020 2107   BILIRUBINUR NEGATIVE 09/12/2020 2107   KETONESUR 5 (A) 09/12/2020 2107   PROTEINUR NEGATIVE 09/12/2020 2107   UROBILINOGEN 0.2 12/12/2019 1533   NITRITE NEGATIVE 09/12/2020 2107   LEUKOCYTESUR NEGATIVE 09/12/2020 2107   Urine Drug Screen     Component Value Date/Time   LABOPIA NONE DETECTED 03/25/2021 1640   COCAINSCRNUR NONE DETECTED 03/25/2021 1640   LABBENZ NONE DETECTED 03/25/2021 1640   AMPHETMU NONE DETECTED 03/25/2021 1640   THCU NONE DETECTED 03/25/2021 1640   LABBARB NONE DETECTED 03/25/2021 1640    Alcohol Level No results found for: [facility]  SIGNIFICANT DIAGNOSTIC STUDIES CT HEAD WO CONTRAST 03/24/2021 IMPRESSION:  Normal head CT.   ECHOCARDIOGRAM COMPLETE 03/24/2021 IMPRESSIONS   1. Left ventricular ejection fraction, by estimation, is 60 to 65%. The left ventricle has normal function. The left ventricle has no regional wall motion abnormalities. There is mild concentric left ventricular hypertrophy. Left ventricular diastolic parameters are consistent with Grade I diastolic dysfunction (impaired relaxation).   2. Right ventricular systolic function is normal. The right ventricular size is normal. There is normal pulmonary artery systolic pressure.   3. The mitral valve is normal in structure. No evidence of mitral valve regurgitation. No evidence of mitral stenosis.   4. The aortic valve is tricuspid. Aortic valve regurgitation is not visualized. No aortic stenosis is present.   5. The inferior vena cava is normal in size with greater than 50% respiratory variability, suggesting right atrial pressure of 3 mmHg.   EEG 03/23/21 IMPRESSION: This study is within normal limits. No seizures or epileptiform discharges were seen throughout the recording.  HISTORY OF PRESENT ILLNESS Ms. Kody Vigil is a 35 y.o. female with history of asthma, blurred vision, migraine, depression, obesity, Psuedotumor cerebri, chest  pain, CAD, and vertigo presenting with  acute onset of left side weakness and aphasia in the setting of just having finished Lexiscan Myoview stress test without significant EKG changes of ischemia. She started twitching left side of face and developed left sided weakness progressing to flaccidity. Code stroke was called.  tPA was administered.   HOSPITAL COURSE Makaria Poarch was admitted and monitored post thrombolytic administration. She remained hemodynamically and neurologically stable with some improvement in weakness on the left. Her work up did not show acute stroke. Her neurologic exam was notable for inconsistency. Therapy teams evaluated her, provided treatment and discharge recommendations.  Discharge home was recommended. 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 ASPECTS 10.    CTA head & neck Prominence of the tonsils and adenoids with resulting narrowed oropharyngeal airway, most likely related to hypertrophy.  MRI - No acute intracranial abnormality identified. Unremarkable MRI of the brain.  2D Echo - EF 60 - 65%. No cardiac source of emboli identified.   EEG - 03/23/21 - This study is within normal limits.   LDL 91  HgbA1c 5.3  VTE prophylaxis - SCD's  No antithrombotic prior to admission, now on aspirin 81 mg daily. Continue on discharge  Therapy recommendations:  Home with home health therapies   Disposition - Home   BP management  Home meds:  none  Stable - mildly low at times   Long-term BP goal normotensive  Lipid management  Home meds: none  LDL 91, goal < 100  No statin needed at this time  Other Stroke Risk Factors  Morbid obesity, Body mass index is 48.79 kg/m., BMI >/= 30 associated with increased stroke risk, recommend weight loss, diet and exercise as appropriate   Migraines - on Topamax  Pseudotumor cerebri -  had been following with [facility] neurology, currently last follow-up  CAD  DISCHARGE EXAM Blood pressure 102/68, pulse 73, temperature 98.4 F (36.9 C), temperature source Oral, resp. rate 20, height 5\' 5"  (1.651 m), weight 133 kg, SpO2 100 %.  35 year old female sitting up in chair at bedside bending over to don socks with OT in attendance.  Resp even and unlabored with no extra work of breathing  Cardiac RRR.   Neurological Exam Awake  Alert oriented x 4.  Hypophonic slow speech but no dysarthria or aphasia.  Follows commands well. EOMI, Vision acuity and fields appear normal. Hearing is normal. Palatal movements are normal. Face symmetric. Tongue midline. BUE with correctable mild drift on the left. Left hand grip weaker than right but appears effort related. LLE proximal 2/5 and distal 3/5, RLE proximal 3/5 and distal at least 4+/5. However, with b/l LE abduction and adduction, the strength are symmetrical bilaterally. Gait not tested.  Discharge Diet       Diet   Diet regular Room service appropriate? Yes with Assist; Fluid consistency: Thin   liquids  DISCHARGE PLAN  Disposition: Home  ASA 81mg  daily   Ongoing stroke risk factor control by Primary Care Physician at time of discharge  Follow-up PCP 31, MD in 2 weeks.  Follow-up in [facility] in 6 weeks, office to schedule an appointment.   35 minutes were spent preparing discharge.  Delila A Bailey-Modzik, NP-C  ATTENDING NOTE: I reviewed above note and agree with the assessment and plan. Pt was seen and examined.   Pt sitting in chair, stated that she feels better than yesterday.  PT/OT recommend home health PT/OT with rolling walker and bedside commode.  Ready for discharge.  Continue baby aspirin daily.  We will follow-up in stroke clinic in 4 weeks.  For detailed assessment and plan, please refer to above as I have made changes wherever appropriate.   ,  MD PhD Stroke Neurology 03/27/2021 5:23 PM

## 2021-03-27 NOTE — Progress Notes (Signed)
Physical Therapy Treatment Patient Details Name: Laurie Fields MRN: [id number] DOB: 1985/12/11 Today's Date: 03/27/2021    History of Present Illness 35 yo female admitted 4/28 with L side weakness and hypophonic speech. s/p TPA. 4/28 MRI negative. PMH Asthma, Blurred vision, obesity depression migraine vertigo pseudotumor  cerebri DM2    PT Comments    Pt reclined in bed upon arrival to room, agreeable to PT as pt states "I am ready to go home". Pt ambulatory for short hallway distance with mod cuing for form and safety with use of RW. Pt with intermittent buckling of LLE, corrected by pt and use of RW. Pt too fatigued to practice steps this am, will see for second session per request of MD as pt is leaving today.    Follow Up Recommendations  SNF;Home health PT (depending on ability to enter/exit home and level of (A) available)     Equipment Recommendations  Rolling walker with 5" wheels;3in1 (PT)    Recommendations for Other Services       Precautions / Restrictions Precautions Precautions: Fall Restrictions Weight Bearing Restrictions: No    Mobility  Bed Mobility Overal bed mobility: Needs Assistance Bed Mobility: Supine to Sit     Supine to sit: Supervision     General bed mobility comments: for safety, increased time and effort to perform.    Transfers Overall transfer level: Needs assistance Equipment used: Rolling walker (2 wheeled) Transfers: Sit to/from Stand Sit to Stand: Min assist         General transfer comment: min assist for initial power up, steadying. VC for proper hand placement. STS x2, from EOB and toilet.  Ambulation/Gait Ambulation/Gait assistance: Min guard Gait Distance (Feet): 60 Feet Assistive device: Rolling walker (2 wheeled) Gait Pattern/deviations: Shuffle;Decreased weight shift to left;Decreased stance time - left;Decreased stride length;Decreased dorsiflexion - left;Trunk flexed;Step-through pattern Gait velocity:  decr   General Gait Details: min guard for safety, pt with intermittent mild L knee buckling corrected with use of RW. Verbal cuing for upright posture, increasing foot clearance LLE, placement within RW, and sequencing steps.   Stairs             Wheelchair Mobility    Modified Rankin (Stroke Patients Only) Modified Rankin (Stroke Patients Only) Pre-Morbid Rankin Score: No symptoms Modified Rankin: Moderately severe disability     Balance Overall balance assessment: Needs assistance Sitting-balance support: Feet supported Sitting balance-Leahy Scale: Fair Sitting balance - Comments: able to sit EOB without PT support   Standing balance support: Bilateral upper extremity supported;During functional activity Standing balance-Leahy Scale: Poor Standing balance comment: reliant on external support in standing                            Cognition Arousal/Alertness: Awake/alert Behavior During Therapy: WFL for tasks assessed/performed Overall Cognitive Status: Within Functional Limits for tasks assessed                                 General Comments: A&Ox4. Pt soft-spoken.      Exercises      General Comments        Pertinent Vitals/Pain Pain Assessment: Faces Faces Pain Scale: No hurt Pain Intervention(s): Monitored during session    Home Living                      Prior Function  PT Goals (current goals can now be found in the care plan section) Acute Rehab PT Goals Patient Stated Goal: to go home PT Goal Formulation: With patient Time For Goal Achievement: 04/07/21 Potential to Achieve Goals: Good Progress towards PT goals: Progressing toward goals    Frequency    Min 4X/week      PT Plan Current plan remains appropriate    Co-evaluation              AM-PAC PT "6 Clicks" Mobility   Outcome Measure  Help needed turning from your back to your side while in a flat bed without using  bedrails?: A Little Help needed moving from lying on your back to sitting on the side of a flat bed without using bedrails?: A Little Help needed moving to and from a bed to a chair (including a wheelchair)?: A Little Help needed standing up from a chair using your arms (Fields.g., wheelchair or bedside chair)?: A Little Help needed to walk in hospital room?: A Little Help needed climbing 3-5 steps with a railing? : A Lot 6 Click Score: 17    End of Session Equipment Utilized During Treatment: Gait belt Activity Tolerance: Patient tolerated treatment well Patient left: with call bell/phone within reach;in chair;with chair alarm set Nurse Communication: Mobility status PT Visit Diagnosis: Unsteadiness on feet (R26.81);Other abnormalities of gait and mobility (R26.89);Muscle weakness (generalized) (M62.81);Difficulty in walking, not elsewhere classified (R26.2);Other symptoms and signs involving the nervous system (R29.898);Hemiplegia and hemiparesis Hemiplegia - Right/Left: Left Hemiplegia - dominant/non-dominant: Non-dominant Hemiplegia - caused by: Unspecified     Time: 9604-5409 PT Time Calculation (min) (ACUTE ONLY): 28 min  Charges:  $Gait Training: 8-22 mins $Therapeutic Activity: 8-22 mins                     Laurie Fields, PT DPT Acute Rehabilitation Services Pager [phone]  Office [phone]   Laurie Fields Laurie Fields 03/27/2021, 12:46 PM

## 2021-03-27 NOTE — Plan of Care (Signed)
  Problem: Education: Goal: Knowledge of disease or condition will improve Outcome: Progressing Goal: Knowledge of secondary prevention will improve Outcome: Progressing Goal: Knowledge of patient specific risk factors addressed and post discharge goals established will improve Outcome: Progressing   Problem: Education: Goal: Knowledge of General Education information will improve Description: Including pain rating scale, medication(s)/side effects and non-pharmacologic comfort measures Outcome: Progressing   Problem: Health Behavior/Discharge Planning: Goal: Ability to manage health-related needs will improve Outcome: Progressing   Problem: Clinical Measurements: Goal: Ability to maintain clinical measurements within normal limits will improve Outcome: Progressing Goal: Will remain free from infection Outcome: Progressing Goal: Diagnostic test results will improve Outcome: Progressing Goal: Respiratory complications will improve Outcome: Progressing Goal: Cardiovascular complication will be avoided Outcome: Progressing   Problem: Activity: Goal: Risk for activity intolerance will decrease Outcome: Progressing   Problem: Nutrition: Goal: Adequate nutrition will be maintained Outcome: Progressing   Problem: Coping: Goal: Level of anxiety will decrease Outcome: Progressing   Problem: Elimination: Goal: Will not experience complications related to bowel motility Outcome: Progressing Goal: Will not experience complications related to urinary retention Outcome: Progressing   Problem: Pain Managment: Goal: General experience of comfort will improve Outcome: Progressing   Problem: Safety: Goal: Ability to remain free from injury will improve Outcome: Progressing   Problem: Skin Integrity: Goal: Risk for impaired skin integrity will decrease Outcome: Progressing   

## 2021-03-27 NOTE — Progress Notes (Signed)
Occupational Therapy Treatment Patient Details Name: Laurie Fields MRN: [id number] DOB: 1986/01/22 Today's Date: 03/27/2021    History of present illness 35 yo female admitted 4/28 with L side weakness and hypophonic speech. s/p TPA. 4/28 MRI negative. PMH Asthma, Blurred vision, obesity depression migraine vertigo pseudotumor  cerebri DM2   OT comments  Patient seated in recliner, reports just finished with PT session.  She completes transfers and in room mobility using RW with min assist to min guard, given cueing for safety and techniques (cueing at times to weightbear through L LE, hopping at times). Completing oral care standing at sink with min guard, LB dressing from recliner with min assist and cueing to utilize L UE.  Educated on safety and fall prevention, having a chair at the sink, completing dressing sitting, and having support for any shower--pt agreeable.  Reviewed exercises for LUE. Patient eager for dc home, reporting she has assist from multiple family members who can assist her with ADLs, mobility as needed. Will follow acutely.    Follow Up Recommendations  Home health OT;Supervision/Assistance - 24 hour    Equipment Recommendations  Wheelchair cushion (measurements OT);3 in 1 bedside commode;Tub/shower bench    Recommendations for Other Services      Precautions / Restrictions Precautions Precautions: Fall Restrictions Weight Bearing Restrictions: No       Mobility Bed Mobility Overal bed mobility: Needs Assistance Bed Mobility: Supine to Sit     Supine to sit: Supervision     General bed mobility comments: OOB in recliner upon entry    Transfers Overall transfer level: Needs assistance Equipment used: Rolling walker (2 wheeled) Transfers: Sit to/from Stand Sit to Stand: Min guard;Min assist         General transfer comment: min guard to min assist for safety/balance with good recall of hand placement and increased time required     Balance Overall balance assessment: Needs assistance Sitting-balance support: Feet supported Sitting balance-Leahy Scale: Fair Sitting balance - Comments: dynamically managing socks with supervision   Standing balance support: Bilateral upper extremity supported;During functional activity Standing balance-Leahy Scale: Poor Standing balance comment: min guard to min assist for safety                           ADL either performed or assessed with clinical judgement   ADL Overall ADL's : Needs assistance/impaired     Grooming: Wash/dry hands;Oral care;Min guard;Standing               Lower Body Dressing: Minimal assistance;Sit to/from stand Lower Body Dressing Details (indicate cue type and reason): pt able to don/doff socks, min guard sit to stand and reviewed donning L LE first with clothing Toilet Transfer: Min guard;Ambulation;RW Toilet Transfer Details (indicate cue type and reason): cueing for safety and RW mgmt         Functional mobility during ADLs: Min guard;Rolling walker;Cueing for safety;Cueing for sequencing General ADL Comments: Patient reports she will have support for ADLs, transfers and mobility at home from family.  Eager for [employer] home. Requires cueing to utilize L UE functionally, reports blurry vision, and requires cueing to weightbear through L LE during ambulation (rather than hopping)     Vision   Additional Comments: not formally assessed, functionally [facility] but pt reports mild blurriness   Perception     Praxis      Cognition Arousal/Alertness: Awake/alert Behavior During Therapy: WFL for tasks assessed/performed Overall Cognitive Status: Within Functional Limits  for tasks assessed                                 General Comments: A&Ox4. Pt soft-spoken.        Exercises Exercises: Other exercises Other Exercises Other Exercises: Educated on S-PROM exercises to L UE to decrease tightness of L shoulder- S-ROM x 10  reps shoulder flexion and IR/ER with R UE assisting L; AROM x 10 reps L elbow flexion/extension, AAROM hand flexion, passive hand extension   Shoulder Instructions       General Comments      Pertinent Vitals/ Pain       Pain Assessment: Faces Faces Pain Scale: No hurt Pain Intervention(s): Monitored during session  Home Living                                          Prior Functioning/Environment              Frequency  Min 2X/week        Progress Toward Goals  OT Goals(current goals can now be found in the care plan section)  Progress towards OT goals: Progressing toward goals  Acute Rehab OT Goals Patient Stated Goal: to go home OT Goal Formulation: With patient  Plan Discharge plan remains appropriate;Frequency remains appropriate    Co-evaluation                 AM-PAC OT "6 Clicks" Daily Activity     Outcome Measure   Help from another person eating meals?: A Little Help from another person taking care of personal grooming?: A Little Help from another person toileting, which includes using toliet, bedpan, or urinal?: A Little Help from another person bathing (including washing, rinsing, drying)?: A Little Help from another person to put on and taking off regular upper body clothing?: A Little Help from another person to put on and taking off regular lower body clothing?: A Little 6 Click Score: 18    End of Session Equipment Utilized During Treatment: Rolling walker  OT Visit Diagnosis: Unsteadiness on feet (R26.81);Muscle weakness (generalized) (M62.81);Pain   Activity Tolerance Patient tolerated treatment well   Patient Left in chair;with call bell/phone within reach;with chair alarm set   Nurse Communication Mobility status        Time: 2297-9892 OT Time Calculation (min): 29 min  Charges: OT General Charges $OT Visit: 1 Visit OT Treatments $Self Care/Home Management : 23-37 mins  Barry Brunner, OT Acute  Rehabilitation Services Pager [phone] Office [phone]    Chancy Milroy 03/27/2021, 1:04 PM

## 2021-03-27 NOTE — Progress Notes (Signed)
Physical Therapy Treatment Patient Details Name: Laurie Fields MRN: [id number] DOB: 1985-12-03 Today's Date: 03/27/2021    History of Present Illness 35 yo female admitted 4/28 with L side weakness and hypophonic speech. s/p TPA. 4/28 MRI negative. PMH Asthma, Blurred vision, obesity depression migraine vertigo pseudotumor  cerebri DM2    PT Comments    Pt seen for second session to address stair navigation, as pt now leaving today. Pt requiring min assist for step navigation, per pt can be provided by her several family members that will be helping her at d/c. Pt able to teach back stair sequencing at end of session. PT encouraged pt to have close supervision for all mobility at d/c, pt states she understands. PT to continue to follow acutely.    Follow Up Recommendations  SNF;Home health PT (depending on ability to enter/exit home and level of (A) available)     Equipment Recommendations  Rolling walker with 5" wheels;3in1 (PT)    Recommendations for Other Services       Precautions / Restrictions Precautions Precautions: Fall Restrictions Weight Bearing Restrictions: No    Mobility  Bed Mobility Overal bed mobility: Needs Assistance Bed Mobility: Supine to Sit     Supine to sit: Supervision     General bed mobility comments: OOB in recliner    Transfers Overall transfer level: Needs assistance Equipment used: Rolling walker (2 wheeled) Transfers: Sit to/from Stand Sit to Stand: Min guard         General transfer comment: min guard for safety, slow to rise and steady.  Ambulation/Gait Ambulation/Gait assistance: Min guard Gait Distance (Feet): 5 Feet (2x5 ft, to get to and from steps from recliner) Assistive device: Rolling walker (2 wheeled) Gait Pattern/deviations: Step-through pattern;Decreased stride length;Trunk flexed Gait velocity: decr   General Gait Details: min guard for safety, slowed short steps with use of RW. No overt buckling LLE  noted today.   Stairs Stairs: Yes Stairs assistance: Min assist Stair Management: Step to pattern;Forwards;One rail Right Number of Stairs: 2 General stair comments: min assist to steady, provide L HHA ascending and R HHA descending, mod cuing for sequencing (up with good leg, down with bad leg leading) and step-to pattern.   Wheelchair Mobility    Modified Rankin (Stroke Patients Only) Modified Rankin (Stroke Patients Only) Pre-Morbid Rankin Score: No symptoms Modified Rankin: Moderately severe disability     Balance Overall balance assessment: Needs assistance Sitting-balance support: Feet supported Sitting balance-Leahy Scale: Fair Sitting balance - Comments: dynamically managing socks with supervision   Standing balance support: Bilateral upper extremity supported;During functional activity Standing balance-Leahy Scale: Poor Standing balance comment: min guard to min assist for safety                            Cognition Arousal/Alertness: Awake/alert Behavior During Therapy: WFL for tasks assessed/performed Overall Cognitive Status: Within Functional Limits for tasks assessed                                 General Comments: A&Ox4. Pt soft-spoken.      Exercises Other Exercises Other Exercises: Educated on S-PROM exercises to L UE to decrease tightness of L shoulder- S-ROM x 10 reps shoulder flexion and IR/ER with R UE assisting L; AROM x 10 reps L elbow flexion/extension, AAROM hand flexion, passive hand extension    General Comments  Pertinent Vitals/Pain Pain Assessment: No/denies pain Faces Pain Scale: No hurt Pain Intervention(s): Monitored during session    Home Living                      Prior Function            PT Goals (current goals can now be found in the care plan section) Acute Rehab PT Goals Patient Stated Goal: to go home PT Goal Formulation: With patient Time For Goal Achievement:  04/07/21 Potential to Achieve Goals: Good Progress towards PT goals: Progressing toward goals    Frequency    Min 4X/week      PT Plan Current plan remains appropriate    Co-evaluation              AM-PAC PT "6 Clicks" Mobility   Outcome Measure  Help needed turning from your back to your side while in a flat bed without using bedrails?: A Little Help needed moving from lying on your back to sitting on the side of a flat bed without using bedrails?: A Little Help needed moving to and from a bed to a chair (including a wheelchair)?: A Little Help needed standing up from a chair using your arms (e.g., wheelchair or bedside chair)?: A Little Help needed to walk in hospital room?: A Little Help needed climbing 3-5 steps with a railing? : A Little 6 Click Score: 18    End of Session Equipment Utilized During Treatment: Gait belt Activity Tolerance: Patient tolerated treatment well Patient left: with call bell/phone within reach;in chair;with chair alarm set Nurse Communication: Mobility status PT Visit Diagnosis: Unsteadiness on feet (R26.81);Other abnormalities of gait and mobility (R26.89);Muscle weakness (generalized) (M62.81);Difficulty in walking, not elsewhere classified (R26.2);Other symptoms and signs involving the nervous system (R29.898);Hemiplegia and hemiparesis Hemiplegia - Right/Left: Left Hemiplegia - dominant/non-dominant: Non-dominant Hemiplegia - caused by: Unspecified     Time: 1250-1303 PT Time Calculation (min) (ACUTE ONLY): 13 min  Charges:  $Gait Training: 8-22 mins       Marye Round, PT DPT Acute Rehabilitation Services Pager [phone]  Office [phone]   [name] 03/27/2021, 3:09 PM

## 2021-05-10 ENCOUNTER — Encounter (HOSPITAL_COMMUNITY): Payer: Self-pay | Admitting: *Deleted

## 2021-05-10 ENCOUNTER — Emergency Department (HOSPITAL_COMMUNITY): Payer: 59

## 2021-05-10 ENCOUNTER — Emergency Department (HOSPITAL_COMMUNITY)
Admission: EM | Admit: 2021-05-10 | Discharge: 2021-05-10 | Disposition: A | Discharge status: Home / Self Care | Payer: 59 | Attending: Emergency Medicine | Admitting: Emergency Medicine

## 2021-05-10 ENCOUNTER — Other Ambulatory Visit: Payer: Self-pay

## 2021-05-10 DIAGNOSIS — G43909 Migraine, unspecified, not intractable, without status migrainosus: Secondary | ICD-10-CM | POA: Insufficient documentation

## 2021-05-10 DIAGNOSIS — Z7982 Long term (current) use of aspirin: Secondary | ICD-10-CM | POA: Diagnosis not present

## 2021-05-10 DIAGNOSIS — R519 Headache, unspecified: Secondary | ICD-10-CM | POA: Diagnosis present

## 2021-05-10 DIAGNOSIS — J45909 Unspecified asthma, uncomplicated: Secondary | ICD-10-CM | POA: Insufficient documentation

## 2021-05-10 DIAGNOSIS — G43001 Migraine without aura, not intractable, with status migrainosus: Secondary | ICD-10-CM

## 2021-05-10 LAB — COMPREHENSIVE METABOLIC PANEL
ALT: 11 U/L (ref 0–44)
AST: 12 U/L — ABNORMAL LOW (ref 15–41)
Albumin: 3.7 g/dL (ref 3.5–5.0)
Alkaline Phosphatase: 56 U/L (ref 38–126)
Anion gap: 5 (ref 5–15)
BUN: 11 mg/dL (ref 6–20)
CO2: 25 mmol/L (ref 22–32)
Calcium: 8.8 mg/dL — ABNORMAL LOW (ref 8.9–10.3)
Chloride: 106 mmol/L (ref 98–111)
Creatinine, Ser: 0.75 mg/dL (ref 0.44–1.00)
GFR, Estimated: >60 mL/min (ref >60)
Glucose, Bld: 88 mg/dL (ref 70–99)
Potassium: 4 mmol/L (ref 3.5–5.1)
Sodium: 136 mmol/L (ref 135–145)
Total Bilirubin: 0.3 mg/dL (ref 0.3–1.2)
Total Protein: 7.8 g/dL (ref 6.5–8.1)

## 2021-05-10 LAB — CBC WITH DIFFERENTIAL/PLATELET
Abs Immature Granulocytes: 0.02 10*3/uL (ref 0.00–0.07)
Basophils Absolute: 0 10*3/uL (ref 0.0–0.1)
Basophils Relative: 0 %
Eosinophils Absolute: 0.1 10*3/uL (ref 0.0–0.5)
Eosinophils Relative: 1 %
HCT: 38.1 % (ref 36.0–46.0)
Hemoglobin: 12.2 g/dL (ref 12.0–15.0)
Immature Granulocytes: 0 %
Lymphocytes Relative: 20 %
Lymphs Abs: 1.5 10*3/uL (ref 0.7–4.0)
MCH: 28.2 pg (ref 26.0–34.0)
MCHC: 32 g/dL (ref 30.0–36.0)
MCV: 88.2 fL (ref 80.0–100.0)
Monocytes Absolute: 0.5 10*3/uL (ref 0.1–1.0)
Monocytes Relative: 6 %
Neutro Abs: 5.4 10*3/uL (ref 1.7–7.7)
Neutrophils Relative %: 73 %
Platelets: 380 10*3/uL (ref 150–400)
RBC: 4.32 MIL/uL (ref 3.87–5.11)
RDW: 13.9 % (ref 11.5–15.5)
WBC: 7.5 10*3/uL (ref 4.0–10.5)
nRBC: 0 % (ref 0.0–0.2)

## 2021-05-10 LAB — PROTIME-INR
INR: 1 (ref 0.8–1.2)
Prothrombin Time: 13.5 seconds (ref 11.4–15.2)

## 2021-05-10 LAB — MAGNESIUM: Magnesium: 2 mg/dL (ref 1.7–2.4)

## 2021-05-10 LAB — TROPONIN I (HIGH SENSITIVITY)
Troponin I (High Sensitivity): <2 ng/L (ref <18)
Troponin I (High Sensitivity): <2 ng/L (ref <18)

## 2021-05-10 MED ORDER — PROCHLORPERAZINE EDISYLATE 10 MG/2ML IJ SOLN
10.0000 mg | Freq: Once | INTRAMUSCULAR | Status: AC
  Administered 2021-05-10: 10 mg via INTRAVENOUS
  Filled 2021-05-10: qty 2

## 2021-05-10 MED ORDER — KETOROLAC TROMETHAMINE 15 MG/ML IJ SOLN
15.0000 mg | Freq: Once | INTRAMUSCULAR | Status: AC
  Administered 2021-05-10: 15 mg via INTRAVENOUS
  Filled 2021-05-10: qty 1

## 2021-05-10 MED ORDER — NAPROXEN 500 MG PO TABS
500.0000 mg | ORAL_TABLET | Freq: Once | ORAL | Status: AC
  Administered 2021-05-10: 500 mg via ORAL
  Filled 2021-05-10: qty 1

## 2021-05-10 MED ORDER — IBUPROFEN 600 MG PO TABS: 600.0000 mg | ORAL_TABLET | Freq: Four times a day (QID) | ORAL | 0 refills | Status: AC | PRN

## 2021-05-10 NOTE — ED Provider Notes (Signed)
[facility]EMERGENCY DEPT Provider Note   CSN: [id number] Arrival date & time: 05/10/21  1422     History Chief Complaint  Patient presents with   Headache   Chest Pain   Weakness    Laurie Fields is a 35 y.o. female.  HPI     SUBJECTIVE: Laurie Fields is a 35 y.o. female who complains of headaches for 2 day(s). Description of pain: throbbing pain, bilateral in the occipital area. Duration of individual headaches: several hour(s), frequency intermittently. Associated symptoms: light sensitivity and motor impairment. Pain relief: unable to obtain relief with OTC meds. Precipitating factors: patient is aware of none. She denies a history of recent head injury.  Prior neurological history: negative for migraine headaches.  Recently admitted for same symptoms - given TPA for concerns for stroke but MRI was neg. Treated as complex migraines. Currently, L side is weaker than usual and she has new R sided weakness.  Scheduled Meds: topamax - not started due to insurance issues.    Past Medical History:  Diagnosis Date   Asthma    Hx intubation R/T asthma   Blurred vision    Depression    Migraine    Obesity    Ovarian cyst    Pseudotumor cerebri    Vertigo     Patient Active Problem List   Diagnosis Date Noted   Acute ischemic stroke (HCC) 03/23/2021   Paresthesia 11/04/2018   Chronic migraine 10/08/2017   Pseudotumor cerebri 10/08/2017    Past Surgical History:  Procedure Laterality Date   ABDOMINAL HYSTERECTOMY     carpel  tunnel release     CESAREAN SECTION     peritoneal cyst       OB History   No obstetric history on file.     Family History  Problem Relation Age of Onset   Hypertension Other    Diabetes Other    Cancer Other    CAD Other    Leukemia Other    Graves' disease Mother    Other Mother        low blood sugar   Hypertension Father    Diabetes Maternal Grandmother    Colon polyps Maternal  Grandmother    Glaucoma Maternal Grandfather    Cataracts Maternal Grandfather    Prostate cancer Maternal Grandfather    Diabetes Paternal Grandmother    Cataracts Paternal Grandfather    Prostate cancer Paternal Grandfather     Social History   Tobacco Use   Smoking status: Never   Smokeless tobacco: Never  Vaping Use   Vaping Use: Never used  Substance Use Topics   Alcohol use: No   Drug use: No    Home Medications Prior to Admission medications   Medication Sig Start Date End Date Taking? Authorizing Provider  albuterol (PROVENTIL) (2.5 MG/3ML) 0.083% nebulizer solution Take 3 mLs (2.5 mg total) by nebulization every 6 (six) hours as needed for wheezing or shortness of breath. 12/07/20  Yes Wallis Bamberg, PA-C  albuterol (VENTOLIN HFA) 108 (90 Base) MCG/ACT inhaler Inhale 2 puffs into the lungs every 4 (four) hours as needed for wheezing or shortness of breath. 12/07/20  Yes Wallis Bamberg, PA-C  aspirin EC 81 MG EC tablet Take 1 tablet (81 mg total) by mouth daily. Swallow whole. 03/28/21  Yes Bailey-Modzik, Delila A, NP  cholecalciferol (VITAMIN D) 25 MCG tablet Take 1 tablet (1,000 Units total) by mouth daily. 03/28/21  Yes Bailey-Modzik, Delila A, NP  senna-docusate (SENOKOT-S) 8.6-50  MG tablet Take 1 tablet by mouth at bedtime as needed for moderate constipation. 03/27/21  Yes Bailey-Modzik, Delila A, NP  Topiramate ER (TROKENDI XR) 200 MG CP24 Take 200 mg by mouth daily.   Yes [provider]  traMADol (ULTRAM) 50 MG tablet Take 1 tablet (50 mg total) by mouth every 6 (six) hours as needed for moderate pain. 03/27/21  Yes Bailey-Modzik, Delila A, NP  triamcinolone cream (KENALOG) 0.1 % Apply 1 application topically 2 (two) times daily. 01/23/21  Yes [provider]  vitamin B-12 1000 MCG tablet Take 1 tablet (1,000 mcg total) by mouth daily. 03/28/21  Yes Bailey-Modzik, Delila A, NP  amLODipine (NORVASC) 2.5 MG tablet Take 2.5 mg by mouth daily. Patient not taking: No sig  reported 01/22/21   [provider]  topiramate ER (QUDEXY XR) 200 MG CS24 sprinkle capsule Take 1 capsule (200 mg total) by mouth at bedtime. Patient not taking: No sig reported 03/27/21   Bailey-Modzik, Delila A, NP    Allergies    Serevent [salmeterol]  Review of Systems   Review of Systems  Physical Exam Updated Vital Signs BP 137/81   Pulse 84   Temp 97.8 F (36.6 C) (Oral)   Resp 16   SpO2 100%   Physical Exam Vitals and nursing note reviewed.  Constitutional:      Appearance: She is well-developed.  HENT:     Head: Atraumatic.  Eyes:     Extraocular Movements: Extraocular movements intact.     Pupils: Pupils are equal, round, and reactive to light.  Cardiovascular:     Rate and Rhythm: Normal rate.  Pulmonary:     Effort: Pulmonary effort is normal.  Musculoskeletal:     Cervical back: Normal range of motion and neck supple.  Skin:    General: Skin is warm and dry.  Neurological:     Mental Status: She is alert and oriented to person, place, and time.     GCS: GCS eye subscore is 4. GCS verbal subscore is 5. GCS motor subscore is 6.     Comments:  Appearance: alert, well appearing, and in no distress. Neurological Exam: alert, oriented, normal speech, no focal findings or movement disorder noted, neck supple without rigidity, cranial nerves II through XII intact, DTR's normal and symmetric, hemiparesis on left, lower, upper.     ED Results / Procedures / Treatments   Labs (all labs ordered are listed, but only abnormal results are displayed) Labs Reviewed  COMPREHENSIVE METABOLIC PANEL - Abnormal; Notable for the following components:      Result Value   Calcium 8.8 (*)    AST 12 (*)    All other components within normal limits  CBC WITH DIFFERENTIAL/PLATELET  PROTIME-INR  MAGNESIUM  TROPONIN I (HIGH SENSITIVITY)  TROPONIN I (HIGH SENSITIVITY)    EKG EKG Interpretation  Date/Time:  Wednesday May 10 2021 14:59:23 EDT Ventricular Rate:   68 PR Interval:  121 QRS Duration: 74 QT Interval:  394 QTC Calculation: 419 R Axis:   68 Text Interpretation: Sinus rhythm Probable left atrial enlargement Low voltage, precordial leads No acute changes No significant change since last tracing Confirmed by Derwood Kaplan [phone]) on 05/10/2021 5:14:39 PM  Radiology DG Chest 2 View  Result Date: 05/10/2021 CLINICAL DATA:  Headache, chest pain, weakness and fatigue for 2 days. EXAM: CHEST - 2 VIEW COMPARISON:  PA and lateral chest 12/05/2020. FINDINGS: Lungs clear. Heart size normal. No pneumothorax or pleural fluid. No acute or  focal bony abnormality. IMPRESSION: Normal chest. Electronically Signed   By: Drusilla Kanner M.D.   On: 05/10/2021 15:58   CT Head Wo Contrast  Result Date: 05/10/2021 CLINICAL DATA:  Neural deficit. EXAM: CT HEAD WITHOUT CONTRAST TECHNIQUE: Contiguous axial images were obtained from the base of the skull through the vertex without intravenous contrast. COMPARISON:  March 24, 2021 FINDINGS: Brain: No evidence of acute infarction, hemorrhage, hydrocephalus, extra-axial collection or mass lesion/mass effect. Vascular: No hyperdense vessel or unexpected calcification. Skull: Normal. Negative for fracture or focal lesion. Sinuses/Orbits: No acute finding. Other: None. IMPRESSION: No acute intracranial abnormality. Electronically Signed   By: Ted Mcalpine M.D.   On: 05/10/2021 16:14   DG Hip Unilat W or Wo Pelvis 2-3 Views Right  Result Date: 05/10/2021 CLINICAL DATA:  Right hip pain.  No known injury. EXAM: DG HIP (WITH OR WITHOUT PELVIS) 2-3V RIGHT COMPARISON:  None. FINDINGS: There is no evidence of hip fracture or dislocation. There is no evidence of arthropathy or other focal bone abnormality. IMPRESSION: Normal exam. Electronically Signed   By: Drusilla Kanner M.D.   On: 05/10/2021 16:01    Procedures Procedures   Medications Ordered in ED Medications  ketorolac (TORADOL) 15 MG/ML injection 15 mg (15 mg  Intravenous Given 05/10/21 1711)  prochlorperazine (COMPAZINE) injection 10 mg (10 mg Intravenous Given 05/10/21 1712)    ED Course  I have reviewed the triage vital signs and the nursing notes.  Pertinent labs & imaging results that were available during my care of the patient were reviewed by me and considered in my medical decision making (see chart for details).  Clinical Course as of 05/10/21 2124  Wed May 10, 2021  2116 Upon reassessment, patient reports that the headache has resolved. She continued to have no neurologic complains. Strict return precautions discussed, pt will return to the ER if there is visual complains, seizures, altered mental status, loss of consciousness, dizziness, new focal weakness, or numbness.    [AN]    Clinical Course User Index [AN] Derwood Kaplan, MD   MDM Rules/Calculators/A&P                          DDX includes: Primary headaches - including migrainous headaches, cluster headaches, tension headaches. ICH Carotid dissection Cavernous sinus thrombosis Meningitis Encephalitis Sinusitis Tumor Vascular headaches AV malformation Brain aneurysm Muscular headaches  A/P: Pt comes in with cc of headaches. Recent admission for same - normal stroke workup at that time. Now has bilateral weakness, L worse than R.  Will start headache meds for migraine and reassess.   Final Clinical Impression(s) / ED Diagnoses Final diagnoses:  None    Rx / DC Orders ED Discharge Orders     None        Derwood Kaplan, MD 05/10/21 2129

## 2021-05-10 NOTE — ED Provider Notes (Signed)
Emergency Medicine Provider Triage Evaluation Note  Laurie Fields , a 35 y.o. female  was evaluated in triage.  Pt complains of weakness for the last 2 days.  Per the patient, she sustained "a stroke or a migraine with strokelike features" that occurred around April 28, for which she was admitted to the hospital.  She states she presented with left-sided weakness at that time and had persistent left-sided weakness when she left the hospital. 2 days ago, she began to feel right-sided weakness as well.  She fell earlier today, but states she did not hit her head.  Complains of right-sided hip pain following the fall.  Takes aspirin, but denies anticoagulation.  Review of Systems  Positive: Headache, weakness, chest pain, right hip pain Negative: Fever, cough, shortness of breath, head injury, neck/back pain, vomiting, diarrhea, abdominal pain  Physical Exam  BP (!) 126/99 (BP Location: Left Arm)   Pulse 81   Temp 97.8 F (36.6 C) (Oral)   Resp 18   SpO2 97%  Gen:   Awake, no distress   Resp:  Normal effort  MSK:   Moves extremities without difficulty, right lateral hip pain and tenderness.  No midline spinal tenderness. Other:  Weakness in the extremities bilaterally, worse on the left.  No noted speech deficits, facial deficits, neglect.  Handles oral secretions without noted difficulty.  Medical Decision Making  Medically screening exam initiated at 3:07 PM.  Appropriate orders placed.  Nelle Caffee was informed that the remainder of the evaluation will be completed by another provider, this initial triage assessment does not replace that evaluation, and the importance of remaining in the ED until their evaluation is complete.     Anselm Pancoast, PA-C 05/10/21 1511    Cathren Laine, MD 05/11/21 1650

## 2021-05-10 NOTE — ED Triage Notes (Signed)
Pt complains of headache, chest pain, weakness and fatigue for the past 2 days. Hx of stroke. Called neurologist and was told to come to ED.

## 2021-05-10 NOTE — Discharge Instructions (Addendum)
Call Neurology for outpatient management. Take ibuprofen for pain control until then.

## 2021-06-19 ENCOUNTER — Emergency Department (HOSPITAL_COMMUNITY)
Admission: EM | Admit: 2021-06-19 | Discharge: 2021-06-20 | Disposition: A | Discharge status: Home / Self Care | Payer: 59 | Attending: Emergency Medicine | Admitting: Emergency Medicine

## 2021-06-19 ENCOUNTER — Other Ambulatory Visit: Payer: Self-pay

## 2021-06-19 DIAGNOSIS — N9489 Other specified conditions associated with female genital organs and menstrual cycle: Secondary | ICD-10-CM | POA: Insufficient documentation

## 2021-06-19 DIAGNOSIS — Z79899 Other long term (current) drug therapy: Secondary | ICD-10-CM | POA: Insufficient documentation

## 2021-06-19 DIAGNOSIS — Z7982 Long term (current) use of aspirin: Secondary | ICD-10-CM | POA: Insufficient documentation

## 2021-06-19 DIAGNOSIS — R519 Headache, unspecified: Secondary | ICD-10-CM

## 2021-06-19 DIAGNOSIS — J45909 Unspecified asthma, uncomplicated: Secondary | ICD-10-CM | POA: Insufficient documentation

## 2021-06-19 NOTE — ED Triage Notes (Signed)
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

## 2021-06-20 ENCOUNTER — Emergency Department (HOSPITAL_COMMUNITY): Payer: 59

## 2021-06-20 LAB — CBC WITH DIFFERENTIAL/PLATELET
Abs Immature Granulocytes: 0.02 10*3/uL (ref 0.00–0.07)
Basophils Absolute: 0 10*3/uL (ref 0.0–0.1)
Basophils Relative: 0 %
Eosinophils Absolute: 0.1 10*3/uL (ref 0.0–0.5)
Eosinophils Relative: 1 %
HCT: 43.6 % (ref 36.0–46.0)
Hemoglobin: 14.1 g/dL (ref 12.0–15.0)
Immature Granulocytes: 0 %
Lymphocytes Relative: 20 %
Lymphs Abs: 1.9 10*3/uL (ref 0.7–4.0)
MCH: 28.4 pg (ref 26.0–34.0)
MCHC: 32.3 g/dL (ref 30.0–36.0)
MCV: 87.9 fL (ref 80.0–100.0)
Monocytes Absolute: 0.5 10*3/uL (ref 0.1–1.0)
Monocytes Relative: 5 %
Neutro Abs: 7.1 10*3/uL (ref 1.7–7.7)
Neutrophils Relative %: 74 %
Platelets: 355 10*3/uL (ref 150–400)
RBC: 4.96 MIL/uL (ref 3.87–5.11)
RDW: 13.9 % (ref 11.5–15.5)
WBC: 9.6 10*3/uL (ref 4.0–10.5)
nRBC: 0 % (ref 0.0–0.2)

## 2021-06-20 LAB — BASIC METABOLIC PANEL
Anion gap: 10 (ref 5–15)
BUN: 8 mg/dL (ref 6–20)
CO2: 22 mmol/L (ref 22–32)
Calcium: 9.5 mg/dL (ref 8.9–10.3)
Chloride: 107 mmol/L (ref 98–111)
Creatinine, Ser: 0.73 mg/dL (ref 0.44–1.00)
GFR, Estimated: >60 mL/min (ref >60)
Glucose, Bld: 93 mg/dL (ref 70–99)
Potassium: 3.5 mmol/L (ref 3.5–5.1)
Sodium: 139 mmol/L (ref 135–145)

## 2021-06-20 LAB — CSF CELL COUNT WITH DIFFERENTIAL
RBC Count, CSF: 3 /mm3 — ABNORMAL HIGH
Tube #: 4
WBC, CSF: 2 /mm3 (ref 0–5)

## 2021-06-20 LAB — PROTEIN, CSF: Total  Protein, CSF: 31 mg/dL (ref 15–45)

## 2021-06-20 LAB — HCG, QUANTITATIVE, PREGNANCY: hCG, Beta Chain, Quant, S: 1 m[IU]/mL (ref <5)

## 2021-06-20 LAB — CBG MONITORING, ED: Glucose-Capillary: 74 mg/dL (ref 70–99)

## 2021-06-20 LAB — GLUCOSE, CSF: Glucose, CSF: 55 mg/dL (ref 40–70)

## 2021-06-20 MED ORDER — KETOROLAC TROMETHAMINE 30 MG/ML IJ SOLN
30.0000 mg | Freq: Once | INTRAMUSCULAR | Status: DC
  Filled 2021-06-20: qty 1

## 2021-06-20 MED ORDER — ONDANSETRON HCL 4 MG/2ML IJ SOLN
4.0000 mg | Freq: Once | INTRAMUSCULAR | Status: AC
  Administered 2021-06-20: 4 mg via INTRAVENOUS
  Filled 2021-06-20: qty 2

## 2021-06-20 MED ORDER — KETOROLAC TROMETHAMINE 30 MG/ML IJ SOLN
30.0000 mg | Freq: Once | INTRAMUSCULAR | Status: AC
  Administered 2021-06-20: 30 mg via INTRAVENOUS

## 2021-06-20 MED ORDER — DROPERIDOL 2.5 MG/ML IJ SOLN
2.5000 mg | Freq: Once | INTRAMUSCULAR | Status: AC
  Administered 2021-06-20: 2.5 mg via INTRAVENOUS
  Filled 2021-06-20: qty 2

## 2021-06-20 MED ORDER — SODIUM CHLORIDE 0.9 % IV SOLN
12.5000 mg | Freq: Four times a day (QID) | INTRAVENOUS | Status: DC | PRN
  Administered 2021-06-20: 12.5 mg via INTRAVENOUS
  Filled 2021-06-20: qty 0.5

## 2021-06-20 MED ORDER — PROMETHAZINE HCL 25 MG PO TABS
25.0000 mg | ORAL_TABLET | Freq: Once | ORAL | Status: DC
  Filled 2021-06-20: qty 1

## 2021-06-20 MED ORDER — SODIUM CHLORIDE 0.9 % IV BOLUS
1000.0000 mL | Freq: Once | INTRAVENOUS | Status: AC
  Administered 2021-06-20: 1000 mL via INTRAVENOUS

## 2021-06-20 MED ORDER — LIDOCAINE HCL 1 % IJ SOLN
INTRAMUSCULAR | Status: AC
  Administered 2021-06-20: 7 mL via INTRATHECAL
  Filled 2021-06-20: qty 20

## 2021-06-20 NOTE — ED Provider Notes (Signed)
  Physical Exam  BP 110/64 (BP Location: Right Arm)   Pulse (!) 58   Temp 98.2 F (36.8 C) (Oral)   Resp 16   SpO2 100%   Physical Exam  ED Course/Procedures     Procedures  MDM  Care assumed at 3 PM.  Patient is here with persistent headaches.  Patient was supposed to get a lumbar puncture outpatient but has worsening headache so had lumbar puncture in the ED.  Her opening pressure was normal.  No signs of meningitis.  Signout pending migraine cocktail and reassessment  6:26 PM Patient was a difficult IV access and I was able to put an ultrasound IV.  Patient was given droperidol and Phenergan and felt better.  I told her to follow-up with her neurologist.   Angiocath insertion Performed by: Richardean Canal  Consent: Verbal consent obtained. Risks and benefits: risks, benefits and alternatives were discussed Time out: Immediately prior to procedure a "time out" was called to verify the correct patient, procedure, equipment, support staff and site/side marked as required.  Preparation: Patient was prepped and draped in the usual sterile fashion.  Vein Location: L antecube  Ultrasound Guided  Gauge: 20 long   Normal blood return and flush without difficulty Patient tolerance: Patient tolerated the procedure well with no immediate complications.         Charlynne Pander, MD 06/20/21 [phone]

## 2021-06-20 NOTE — Discharge Instructions (Addendum)
You need to follow-up with your neurologist at [facility]  Continue your current medicines.  Return to ER if you have worsening headaches, vomiting, fever, neck stiffness

## 2021-06-20 NOTE — ED Notes (Signed)
Per pt request, called [facility] to inform them she would not be able to make today's appt and that she would call to reschedule.

## 2021-06-20 NOTE — ED Provider Notes (Signed)
[facility]-EMERGENCY DEPT Provider Note   CSN: [id number] Arrival date & time: 06/19/21  2343     History No chief complaint on file.   Laurie Fields is a 35 y.o. female.  HPI Patient presents with headache and dizziness.  Headache is been more constant.  Somewhat comes and goes.  History of headaches.  History of pseudotumor cerebri.  Has seen by neurology for this.  Dr. Baldo Daub.  Has had an MRI and MRV done.  Both reassuring.  Plan to have a LP done to further evaluate the history of pseudotumor.  Is on Topamax for headaches.  Continued symptoms.  States that vision gets blurry at times.  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  Has had MRI of that that was reassuring.  States sometimes right-sided get weak too.    Past Medical History:  Diagnosis Date   Asthma    Hx intubation R/T asthma   Blurred vision    Depression    Migraine    Obesity    Ovarian cyst    Pseudotumor cerebri    Vertigo     Patient Active Problem List   Diagnosis Date Noted   Acute ischemic stroke (HCC) 03/23/2021   Paresthesia 11/04/2018   Chronic migraine 10/08/2017   Pseudotumor cerebri 10/08/2017    Past Surgical History:  Procedure Laterality Date   ABDOMINAL HYSTERECTOMY     carpel  tunnel release     CESAREAN SECTION     peritoneal cyst       OB History   No obstetric history on file.     Family History  Problem Relation Age of Onset   Hypertension Other    Diabetes Other    Cancer Other    CAD Other    Leukemia Other    Graves' disease Mother    Other Mother        low blood sugar   Hypertension Father    Diabetes Maternal Grandmother    Colon polyps Maternal Grandmother    Glaucoma Maternal Grandfather    Cataracts Maternal Grandfather    Prostate cancer Maternal Grandfather    Diabetes Paternal Grandmother    Cataracts Paternal Grandfather    Prostate cancer Paternal Grandfather     Social History   Tobacco Use   Smoking status: Never    Smokeless tobacco: Never  Vaping Use   Vaping Use: Never used  Substance Use Topics   Alcohol use: No   Drug use: No    Home Medications Prior to Admission medications   Medication Sig Start Date End Date Taking? Authorizing Provider  albuterol (PROVENTIL) (2.5 MG/3ML) 0.083% nebulizer solution Take 3 mLs (2.5 mg total) by nebulization every 6 (six) hours as needed for wheezing or shortness of breath. 12/07/20   Wallis Bamberg, PA-C  albuterol (VENTOLIN HFA) 108 (90 Base) MCG/ACT inhaler Inhale 2 puffs into the lungs every 4 (four) hours as needed for wheezing or shortness of breath. 12/07/20   Wallis Bamberg, PA-C  amLODipine (NORVASC) 2.5 MG tablet Take 2.5 mg by mouth daily. Patient not taking: No sig reported 01/22/21   [provider]  aspirin EC 81 MG EC tablet Take 1 tablet (81 mg total) by mouth daily. Swallow whole. 03/28/21   Bailey-Modzik, Delila A, NP  busPIRone (BUSPAR) 7.5 MG tablet Take 7.5 mg by mouth 2 (two) times daily as needed for anxiety. 05/31/21   [provider]  cholecalciferol (VITAMIN D) 25 MCG tablet Take 1 tablet (1,000 Units  total) by mouth daily. 03/28/21   Bailey-Modzik, Delila A, NP  escitalopram (LEXAPRO) 20 MG tablet Take 20 mg by mouth daily. 05/31/21   [provider]  folic acid (FOLVITE) 1 MG tablet Take 1 mg by mouth 2 (two) times daily. 06/08/21   [provider]  ibuprofen (ADVIL) 600 MG tablet Take 1 tablet (600 mg total) by mouth every 6 (six) hours as needed. 05/10/21   Derwood Kaplan, MD  LORazepam (ATIVAN) 0.5 MG tablet Take 0.5-1 mg by mouth 3 (three) times daily as needed for anxiety. 05/31/21   [provider]  senna-docusate (SENOKOT-S) 8.6-50 MG tablet Take 1 tablet by mouth at bedtime as needed for moderate constipation. 03/27/21   Bailey-Modzik, Delila A, NP  topiramate (TOPAMAX) 200 MG tablet Take 200 mg by mouth daily. 06/08/21   [provider]  topiramate ER (QUDEXY XR) 200 MG CS24 sprinkle capsule Take 1  capsule (200 mg total) by mouth at bedtime. Patient not taking: No sig reported 03/27/21   Bailey-Modzik, Delila A, NP  Topiramate ER (TROKENDI XR) 200 MG CP24 Take 200 mg by mouth daily.    [provider]  traMADol (ULTRAM) 50 MG tablet Take 1 tablet (50 mg total) by mouth every 6 (six) hours as needed for moderate pain. 03/27/21   Bailey-Modzik, Delila A, NP  triamcinolone cream (KENALOG) 0.1 % Apply 1 application topically 2 (two) times daily. 01/23/21   [provider]  vitamin B-12 1000 MCG tablet Take 1 tablet (1,000 mcg total) by mouth daily. 03/28/21   Bailey-Modzik, Ward Chatters, NP    Allergies    Serevent [salmeterol]  Review of Systems   Review of Systems  Constitutional:  Negative for appetite change.  HENT:  Negative for congestion.   Respiratory:  Negative for chest tightness.   Cardiovascular:  Negative for chest pain.  Gastrointestinal:  Positive for nausea.  Genitourinary:  Negative for flank pain.  Musculoskeletal:  Negative for back pain.  Skin:  Negative for rash.  Neurological:  Positive for dizziness and headaches.  Psychiatric/Behavioral:  Negative for confusion.    Physical Exam Updated Vital Signs BP (!) 160/85   Pulse (!) 57   Temp 98.2 F (36.8 C) (Oral)   Resp 13   SpO2 100%   Physical Exam Vitals and nursing note reviewed.  HENT:     Head: Atraumatic.  Eyes:     Extraocular Movements: Extraocular movements intact.     Pupils: Pupils are equal, round, and reactive to light.  Cardiovascular:     Rate and Rhythm: Regular rhythm.  Pulmonary:     Breath sounds: No wheezing or rhonchi.  Abdominal:     Tenderness: There is no abdominal tenderness.  Musculoskeletal:        General: No tenderness.     Cervical back: Neck supple.  Skin:    General: Skin is warm.  Neurological:     Mental Status: She is alert.     Comments: Chronic left-sided weakness.  Otherwise awake and appropriate.  Sitting in her chair.    ED Results / Procedures  / Treatments   Labs (all labs ordered are listed, but only abnormal results are displayed) Labs Reviewed  CSF CELL COUNT WITH DIFFERENTIAL - Abnormal; Notable for the following components:      Result Value   RBC Count, CSF 3 (*)    All other components within normal limits  CSF CULTURE W GRAM STAIN  BASIC METABOLIC PANEL  CBC WITH DIFFERENTIAL/PLATELET  HCG, QUANTITATIVE, PREGNANCY  GLUCOSE, CSF  PROTEIN, CSF  CBG MONITORING, ED    EKG None  Radiology DG FLUORO GUIDE LUMBAR PUNCTURE  Result Date: 06/20/2021 CLINICAL DATA:  35 y.o female c/o headache. Lumbar puncture for CSF labs. History of pseudotumor cerebri. Negative head CT 05/10/2021. EXAM: LUMBAR PUNCTURE UNDER FLUOROSCOPY FLUOROSCOPY TIME:  36 seconds; 20 mGy TECHNIQUE: An appropriate skin entry site was determined fluoroscopically. Operator donned sterile gloves and mask. Skin site was marked, then prepped with Betadine, draped in usual sterile fashion, and infiltrated locally with 1% lidocaine. A 6-inch 20 gauge spinal needle advanced into the thecal sac at L4-5 from a right interlaminar approach. Clear colorless CSF spontaneously returned, with opening pressure of 18 cm water. 10.79ml CSF were collected and divided among 4 sterile vials for the requested laboratory studies. Closing pressure 8 cm H2O. The needle was then removed. Operator: Anders Grant NP COMPLICATIONS: None immediate IMPRESSION: 1. Technically successful lumbar puncture under fluoroscopy. Electronically Signed   By: Corlis Leak M.D.   On: 06/20/2021 13:41    Procedures Procedures   Medications Ordered in ED Medications  promethazine (PHENERGAN) 12.5 mg in sodium chloride 0.9 % 50 mL IVPB (12.5 mg Intravenous Not Given 06/20/21 1447)  promethazine (PHENERGAN) tablet 25 mg (has no administration in time range)  ketorolac (TORADOL) 30 MG/ML injection 30 mg (has no administration in time range)  ondansetron (ZOFRAN) injection 4 mg (4 mg Intravenous Given  06/20/21 1126)  lidocaine (XYLOCAINE) 1 % (with pres) injection (7 mLs Intrathecal Given 06/20/21 1137)    ED Course  I have reviewed the triage vital signs and the nursing notes.  Pertinent labs & imaging results that were available during my care of the patient were reviewed by me and considered in my medical decision making (see chart for details).    MDM Rules/Calculators/A&P                           Patient with headache.  Typical headache.  Has been seen by neurology.  Plan for a lumbar puncture.  LP done and did not show elevated pressures like she has had previously.  Continued headache.  Thought of migraine.  Had written for IV Phenergan but line was lost.  Patient requested oral.  Now states she is still hurting for headache.  We will add some Toradol.  Outpatient follow-up with neurology.  Care turned over to Dr. Silverio Lay. Final Clinical Impression(s) / ED Diagnoses Final diagnoses:  Headache    Rx / DC Orders ED Discharge Orders     None        Benjiman Core, MD 06/20/21 1550

## 2021-06-20 NOTE — ED Provider Notes (Signed)
Emergency Medicine Provider Triage Evaluation Note  Laurie Fields , a 35 y.o. female  was evaluated in triage.  Patient brought in by EMS after the patient's son found her laying on the couch with seizure-like activity.  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  She is followed by neurology at [facility].   Review of Systems  Positive: N/A Negative: N/A Level 5 caveat secondary to altered mental status. Physical Exam  BP 132/86 (BP Location: Right Arm)   Pulse 66   Temp 98.2 F (36.8 C) (Oral)   Resp 17   SpO2 98%  Gen:   Eyes are open.  Pupils are equal round and reactive to light.  Eyes are tracking across the room, but she does not move her head. Resp:  Normal effort  MSK:               No cogwheeling, flaccidity, or rigidity to the upper or lower extremities Other:  Tremors noted to the upper and lower extremities intermittently.  Patient would not answer questions.  When her right hand is raised above her head, it falls to the side.   Medical Decision Making  Medically screening exam initiated at 1:01 AM.  Appropriate orders placed.  Laurie Fields was informed that the remainder of the evaluation will be completed by another provider, this initial triage assessment does not replace that evaluation, and the importance of remaining in the ED until their evaluation is complete.  Patient was seen and evaluated in Triage with Dr. Nicanor Alcon, attending physician.  I reviewed the patient's medical record.  She has had multiple negative EEGs.  She had a recent MRI and MRV that were unremarkable.  Vital signs are stable.  Labs have been ordered.  She is stable at this time.  She will require further work-up and evaluation in the emergency department.   Barkley Boards, PA-C 06/20/21 0235    Palumbo, April, MD 06/20/21 5631

## 2021-06-20 NOTE — ED Notes (Signed)
Pt continues to experience nausea. Informed provider.

## 2021-06-23 LAB — CSF CULTURE W GRAM STAIN
Culture: NO GROWTH
Gram Stain: NONE SEEN

## 2021-06-26 ENCOUNTER — Other Ambulatory Visit: Payer: Self-pay

## 2021-06-26 NOTE — Patient Outreach (Signed)
[facility]) Care Management  06/26/2021  Azani Brogdon 06/25/1986 [phone]   Telephone outreach to patient to obtain mRS was successfully completed. MRS= 3  Thank you, Vanice Sarah [facility] Care Management Assistant

## 2021-09-11 ENCOUNTER — Other Ambulatory Visit: Payer: Self-pay

## 2021-09-11 ENCOUNTER — Ambulatory Visit
Admission: EM | Admit: 2021-09-11 | Discharge: 2021-09-11 | Disposition: A | Discharge status: Home / Self Care | Payer: 59 | Attending: Internal Medicine | Admitting: Internal Medicine

## 2021-09-11 ENCOUNTER — Ambulatory Visit (INDEPENDENT_AMBULATORY_CARE_PROVIDER_SITE_OTHER): Payer: 59

## 2021-09-11 ENCOUNTER — Encounter: Payer: Self-pay | Admitting: Emergency Medicine

## 2021-09-11 DIAGNOSIS — W19XXXA Unspecified fall, initial encounter: Secondary | ICD-10-CM

## 2021-09-11 DIAGNOSIS — M79645 Pain in left finger(s): Secondary | ICD-10-CM

## 2021-09-11 HISTORY — DX: Unspecified convulsions: R56.9

## 2021-09-11 NOTE — Discharge Instructions (Addendum)
Your x-ray was negative for fracture or dislocation.  A finger splint has been applied.  Please follow-up with provided contact information for orthopedist tomorrow for further evaluation and management.

## 2021-09-11 NOTE — ED Provider Notes (Signed)
[facility]    CSN: [id number] Arrival date & time: 09/11/21  1713      History   Chief Complaint Chief Complaint  Patient presents with   Fall   Finger Injury    HPI Laurie Fields is a 35 y.o. female.   Patient presents with left third digit pain of the hand that started approximately 1 week ago after a fall.  Patient reports that she had a seizure approximately 1 week ago and landed on her hand.  Patient reports that she hit it again today and caused the pain to be more severe.  Denies any numbness or tingling.  Patient requesting x-ray to make sure that she did not break it when she fell when she had her seizure.  Was evaluated by neurology after seizure occurred 1 week ago.   Fall   Past Medical History:  Diagnosis Date   Asthma    Hx intubation R/T asthma   Blurred vision    Depression    Migraine    Obesity    Ovarian cyst    Pseudotumor cerebri    Seizures (HCC)    Vertigo     Patient Active Problem List   Diagnosis Date Noted   Acute ischemic stroke (HCC) 03/23/2021   Paresthesia 11/04/2018   Chronic migraine 10/08/2017   Pseudotumor cerebri 10/08/2017    Past Surgical History:  Procedure Laterality Date   ABDOMINAL HYSTERECTOMY     carpel  tunnel release     CESAREAN SECTION     peritoneal cyst      OB History   No obstetric history on file.      Home Medications    Prior to Admission medications   Medication Sig Start Date End Date Taking? Authorizing Provider  albuterol (PROVENTIL) (2.5 MG/3ML) 0.083% nebulizer solution Take 3 mLs (2.5 mg total) by nebulization every 6 (six) hours as needed for wheezing or shortness of breath. 12/07/20   Wallis Bamberg, PA-C  albuterol (VENTOLIN HFA) 108 (90 Base) MCG/ACT inhaler Inhale 2 puffs into the lungs every 4 (four) hours as needed for wheezing or shortness of breath. 12/07/20   Wallis Bamberg, PA-C  amLODipine (NORVASC) 2.5 MG tablet Take 2.5 mg by mouth daily. Patient not taking:  No sig reported 01/22/21   [provider]  aspirin EC 81 MG EC tablet Take 1 tablet (81 mg total) by mouth daily. Swallow whole. 03/28/21   Bailey-Modzik, Delila A, NP  busPIRone (BUSPAR) 7.5 MG tablet Take 7.5 mg by mouth 2 (two) times daily as needed for anxiety. 05/31/21   [provider]  cholecalciferol (VITAMIN D) 25 MCG tablet Take 1 tablet (1,000 Units total) by mouth daily. 03/28/21   Bailey-Modzik, Delila A, NP  escitalopram (LEXAPRO) 20 MG tablet Take 20 mg by mouth daily. 05/31/21   [provider]  folic acid (FOLVITE) 1 MG tablet Take 1 mg by mouth 2 (two) times daily. 06/08/21   [provider]  ibuprofen (ADVIL) 600 MG tablet Take 1 tablet (600 mg total) by mouth every 6 (six) hours as needed. 05/10/21   Derwood Kaplan, MD  LORazepam (ATIVAN) 0.5 MG tablet Take 0.5-1 mg by mouth 3 (three) times daily as needed for anxiety. 05/31/21   [provider]  senna-docusate (SENOKOT-S) 8.6-50 MG tablet Take 1 tablet by mouth at bedtime as needed for moderate constipation. 03/27/21   Bailey-Modzik, Delila A, NP  topiramate (TOPAMAX) 200 MG tablet Take 200 mg by mouth daily. 06/08/21  [provider]  topiramate ER (QUDEXY XR) 200 MG CS24 sprinkle capsule Take 1 capsule (200 mg total) by mouth at bedtime. Patient not taking: No sig reported 03/27/21   Bailey-Modzik, Delila A, NP  Topiramate ER (TROKENDI XR) 200 MG CP24 Take 200 mg by mouth daily.    [provider]  traMADol (ULTRAM) 50 MG tablet Take 1 tablet (50 mg total) by mouth every 6 (six) hours as needed for moderate pain. 03/27/21   Bailey-Modzik, Delila A, NP  triamcinolone cream (KENALOG) 0.1 % Apply 1 application topically 2 (two) times daily. 01/23/21   [provider]  vitamin B-12 1000 MCG tablet Take 1 tablet (1,000 mcg total) by mouth daily. 03/28/21   Bailey-Modzik, Jillene Bucks A, NP    Family History Family History  Problem Relation Age of Onset   Hypertension Other     Diabetes Other    Cancer Other    CAD Other    Leukemia Other    Graves' disease Mother    Other Mother        low blood sugar   Hypertension Father    Diabetes Maternal Grandmother    Colon polyps Maternal Grandmother    Glaucoma Maternal Grandfather    Cataracts Maternal Grandfather    Prostate cancer Maternal Grandfather    Diabetes Paternal Grandmother    Cataracts Paternal Grandfather    Prostate cancer Paternal Grandfather     Social History Social History   Tobacco Use   Smoking status: Never   Smokeless tobacco: Never  Vaping Use   Vaping Use: Never used  Substance Use Topics   Alcohol use: No   Drug use: No     Allergies   Serevent [salmeterol]   Review of Systems Review of Systems Per HPI  Physical Exam Triage Vital Signs ED Triage Vitals  Enc Vitals Group     BP 09/11/21 1840 103/70     Pulse Rate 09/11/21 1840 66     Resp 09/11/21 1840 16     Temp 09/11/21 1840 98 F (36.7 C)     Temp Source 09/11/21 1840 Oral     SpO2 09/11/21 1840 98 %     Weight --      Height --      Head Circumference --      Peak Flow --      Pain Score 09/11/21 1841 6     Pain Loc --      Pain Edu? --      Excl. in GC? --    No data found.  Updated Vital Signs BP 103/70 (BP Location: Right Arm)   Pulse 66   Temp 98 F (36.7 C) (Oral)   Resp 16   SpO2 98%   Visual Acuity Right Eye Distance:   Left Eye Distance:   Bilateral Distance:    Right Eye Near:   Left Eye Near:    Bilateral Near:     Physical Exam Constitutional:      General: She is not in acute distress.    Appearance: Normal appearance. She is not toxic-appearing or diaphoretic.  HENT:     Head: Normocephalic and atraumatic.  Eyes:     Extraocular Movements: Extraocular movements intact.     Conjunctiva/sclera: Conjunctivae normal.  Pulmonary:     Effort: Pulmonary effort is normal.  Musculoskeletal:     Right hand: Normal.     Left hand: Tenderness present. No swelling or bony  tenderness. Decreased range of  motion. Decreased strength. Normal sensation. Normal capillary refill. Normal pulse.     Comments: Tenderness to palpation throughout DIP joint of the left third digit of hand.  Patient is not able to flex the DIP joint at this digit.  Severe pain with manual manipulation of DIP joint.  Neurovascular intact.  No swelling, abrasions, lacerations noted.  Neurological:     General: No focal deficit present.     Mental Status: She is alert and oriented to person, place, and time. Mental status is at baseline.  Psychiatric:        Mood and Affect: Mood normal.        Behavior: Behavior normal.        Thought Content: Thought content normal.        Judgment: Judgment normal.     UC Treatments / Results  Labs (all labs ordered are listed, but only abnormal results are displayed) Labs Reviewed - No data to display  EKG   Radiology DG Finger Middle Left  Result Date: 09/11/2021 CLINICAL DATA:  Fall, pain. EXAM: LEFT MIDDLE FINGER 2+V COMPARISON:  None. FINDINGS: There is no evidence of fracture or dislocation. There is no evidence of arthropathy or other focal bone abnormality. Soft tissues are unremarkable. IMPRESSION: Negative. Electronically Signed   By: Darliss Cheney M.D.   On: 09/11/2021 19:17    Procedures Procedures (including critical care time)  Medications Ordered in UC Medications - No data to display  Initial Impression / Assessment and Plan / UC Course  I have reviewed the triage vital signs and the nursing notes.  Pertinent labs & imaging results that were available during my care of the patient were reviewed by me and considered in my medical decision making (see chart for details).     Finger x-ray was negative for any acute bony abnormality.  Suspect left finger sprain.  Although, the limited range of motion of the DIP joint is worrisome.  Splint was applied.  Advised patient to follow-up with orthopedist for further evaluation and  management.Discussed strict return precautions. Patient verbalized understanding and is agreeable with plan.  Final Clinical Impressions(s) / UC Diagnoses   Final diagnoses:  Finger pain, left     Discharge Instructions      Your x-ray was negative for fracture or dislocation.  A finger splint has been applied.  Please follow-up with provided contact information for orthopedist tomorrow for further evaluation and management.     ED Prescriptions   None    PDMP not reviewed this encounter.   Lance Muss, FNP 09/11/21 [phone]

## 2021-09-11 NOTE — ED Triage Notes (Signed)
Fall last week when she had a seizure. Left middle finger pain at that time. Recently hit it today and continued to have shooting pain in area. Believes she may have broken it during the seizure. Pain with moving and at rest.

## 2021-09-27 ENCOUNTER — Ambulatory Visit (INDEPENDENT_AMBULATORY_CARE_PROVIDER_SITE_OTHER): Payer: 59

## 2021-09-27 ENCOUNTER — Ambulatory Visit
Admission: EM | Admit: 2021-09-27 | Discharge: 2021-09-27 | Disposition: A | Discharge status: Home / Self Care | Payer: 59 | Attending: Internal Medicine | Admitting: Internal Medicine

## 2021-09-27 DIAGNOSIS — W19XXXA Unspecified fall, initial encounter: Secondary | ICD-10-CM | POA: Diagnosis not present

## 2021-09-27 DIAGNOSIS — M79644 Pain in right finger(s): Secondary | ICD-10-CM | POA: Diagnosis not present

## 2021-09-27 DIAGNOSIS — S6991XA Unspecified injury of right wrist, hand and finger(s), initial encounter: Secondary | ICD-10-CM

## 2021-09-27 NOTE — ED Triage Notes (Signed)
Pt c/o sz last Saturday. States she "rolled down the steps." Thinks there was injury to right thumb area.

## 2021-09-27 NOTE — ED Provider Notes (Signed)
[facility]    CSN: [id number] Arrival date & time: 09/27/21  1606      History   Chief Complaint Chief Complaint  Patient presents with   Finger Injury    HPI Laurie Fields is a 35 y.o. female.   Patient here today for evaluation of right thumb pain that started after she had a seizure and fell down her steps 4 days ago.  She reports that she has continued to have pain in her thumb, but states that swelling has improved.  She has been taking Tylenol with mild relief.  The history is provided by the patient.   Past Medical History:  Diagnosis Date   Asthma    Hx intubation R/T asthma   Blurred vision    Depression    Migraine    Obesity    Ovarian cyst    Pseudotumor cerebri    Seizures ([place])    Vertigo     Patient Active Problem List   Diagnosis Date Noted   Acute ischemic stroke ([place]) 03/23/2021   Paresthesia 11/04/2018   Chronic migraine 10/08/2017   Pseudotumor cerebri 10/08/2017    Past Surgical History:  Procedure Laterality Date   ABDOMINAL HYSTERECTOMY     carpel  tunnel release     CESAREAN SECTION     peritoneal cyst      OB History   No obstetric history on file.      Home Medications    Prior to Admission medications   Medication Sig Start Date End Date Taking? Authorizing Provider  albuterol (PROVENTIL) (2.5 MG/3ML) 0.083% nebulizer solution Take 3 mLs (2.5 mg total) by nebulization every 6 (six) hours as needed for wheezing or shortness of breath. 12/07/20   Jaynee Eagles, PA-C  albuterol (VENTOLIN HFA) 108 (90 Base) MCG/ACT inhaler Inhale 2 puffs into the lungs every 4 (four) hours as needed for wheezing or shortness of breath. 12/07/20   Jaynee Eagles, PA-C  amLODipine (NORVASC) 2.5 MG tablet Take 2.5 mg by mouth daily. Patient not taking: No sig reported 01/22/21   [provider]  aspirin EC 81 MG EC tablet Take 1 tablet (81 mg total) by mouth daily. Swallow whole. 03/28/21   Bailey-Modzik, Delila A, NP   busPIRone (BUSPAR) 7.5 MG tablet Take 7.5 mg by mouth 2 (two) times daily as needed for anxiety. 05/31/21   [provider]  cholecalciferol (VITAMIN D) 25 MCG tablet Take 1 tablet (1,000 Units total) by mouth daily. 03/28/21   Bailey-Modzik, Delila A, NP  escitalopram (LEXAPRO) 20 MG tablet Take 20 mg by mouth daily. 05/31/21   [provider]  folic acid (FOLVITE) 1 MG tablet Take 1 mg by mouth 2 (two) times daily. 06/08/21   [provider]  ibuprofen (ADVIL) 600 MG tablet Take 1 tablet (600 mg total) by mouth every 6 (six) hours as needed. 05/10/21   Varney Biles, MD  LORazepam (ATIVAN) 0.5 MG tablet Take 0.5-1 mg by mouth 3 (three) times daily as needed for anxiety. 05/31/21   [provider]  senna-docusate (SENOKOT-S) 8.6-50 MG tablet Take 1 tablet by mouth at bedtime as needed for moderate constipation. 03/27/21   Bailey-Modzik, Delila A, NP  sertraline (ZOLOFT) 50 MG tablet Take 50 mg by mouth daily. 08/26/21   [provider]  topiramate (TOPAMAX) 200 MG tablet Take 200 mg by mouth daily. 06/08/21   [provider]  topiramate ER (QUDEXY XR) 200 MG CS24 sprinkle capsule Take 1 capsule (200 mg  total) by mouth at bedtime. Patient not taking: No sig reported 03/27/21   Bailey-Modzik, Delila A, NP  Topiramate ER (TROKENDI XR) 200 MG CP24 Take 200 mg by mouth daily.    [provider]  traMADol (ULTRAM) 50 MG tablet Take 1 tablet (50 mg total) by mouth every 6 (six) hours as needed for moderate pain. 03/27/21   Bailey-Modzik, Delila A, NP  triamcinolone cream (KENALOG) 0.1 % Apply 1 application topically 2 (two) times daily. 01/23/21   [provider]  vitamin B-12 1000 MCG tablet Take 1 tablet (1,000 mcg total) by mouth daily. 03/28/21   Bailey-Modzik, Morene Crocker, NP    Family History Family History  Problem Relation Age of Onset   Hypertension Other    Diabetes Other    Cancer Other    CAD Other    Leukemia Other    Graves' disease  Mother    Other Mother        low blood sugar   Hypertension Father    Diabetes Maternal Grandmother    Colon polyps Maternal Grandmother    Glaucoma Maternal Grandfather    Cataracts Maternal Grandfather    Prostate cancer Maternal Grandfather    Diabetes Paternal Grandmother    Cataracts Paternal Grandfather    Prostate cancer Paternal Grandfather     Social History Social History   Tobacco Use   Smoking status: Never   Smokeless tobacco: Never  Vaping Use   Vaping Use: Never used  Substance Use Topics   Alcohol use: No   Drug use: No     Allergies   Serevent [salmeterol]   Review of Systems Review of Systems  Constitutional:  Negative for chills and fever.  Eyes:  Negative for discharge and redness.  Respiratory:  Negative for shortness of breath.   Gastrointestinal:  Negative for abdominal pain, nausea and vomiting.  Genitourinary:  Positive for vaginal bleeding and vaginal discharge.  Musculoskeletal:  Positive for arthralgias and joint swelling.  Neurological:  Negative for numbness.    Physical Exam Triage Vital Signs ED Triage Vitals  Enc Vitals Group     BP 09/27/21 1739 131/90     Pulse Rate 09/27/21 1739 72     Resp 09/27/21 1739 18     Temp 09/27/21 1739 97.9 F (36.6 C)     Temp Source 09/27/21 1739 Oral     SpO2 09/27/21 1739 97 %     Weight --      Height --      Head Circumference --      Peak Flow --      Pain Score 09/27/21 1740 0     Pain Loc --      Pain Edu? --      Excl. in [place]? --    No data found.  Updated Vital Signs BP 131/90 (BP Location: Right Arm)   Pulse 72   Temp 97.9 F (36.6 C) (Oral)   Resp 18   SpO2 97%    Physical Exam Vitals and nursing note reviewed.  Constitutional:      General: She is not in acute distress.    Appearance: Normal appearance. She is not ill-appearing.  HENT:     Head: Normocephalic and atraumatic.  Eyes:     Conjunctiva/sclera: Conjunctivae normal.  Cardiovascular:     Rate and  Rhythm: Normal rate.  Pulmonary:     Effort: Pulmonary effort is normal.  Musculoskeletal:     Comments: Mild swelling diffusely  to right thumb with decreased ROM and diffuse TTP.   Skin:    Capillary Refill: Cap refill normal to right distal thumb Neurological:     Mental Status: She is alert.     Comments: Gross sensation intact to right thumb  Psychiatric:        Mood and Affect: Mood normal.        Behavior: Behavior normal.        Thought Content: Thought content normal.     UC Treatments / Results  Labs (all labs ordered are listed, but only abnormal results are displayed) Labs Reviewed - No data to display  EKG   Radiology DG Finger Thumb Right  Result Date: 09/27/2021 CLINICAL DATA:  Fall, right thumb pain EXAM: RIGHT THUMB 2+V COMPARISON:  None. FINDINGS: There is no evidence of fracture or dislocation. There is no evidence of arthropathy or other focal bone abnormality. Soft tissues are unremarkable. IMPRESSION: Negative. Electronically Signed   By: Charlett Nose M.D.   On: 09/27/2021 17:57    Procedures Procedures (including critical care time)  Medications Ordered in UC Medications - No data to display  Initial Impression / Assessment and Plan / UC Course  I have reviewed the triage vital signs and the nursing notes.  Pertinent labs & imaging results that were available during my care of the patient were reviewed by me and considered in my medical decision making (see chart for details).   Xray without fracture. Recommended symptomatic treatment and monitoring. Patient is planning follow up with ortho for her left hand. Encouraged follow up here if symptoms worsen in any way in the meantime.   Final Clinical Impressions(s) / UC Diagnoses   Final diagnoses:  Thumb injury, right, initial encounter   Discharge Instructions   None    ED Prescriptions   None    PDMP not reviewed this encounter.   Tomi Bamberger, PA-C 09/27/21 1827

## 2021-12-01 ENCOUNTER — Emergency Department (HOSPITAL_COMMUNITY): Admission: EM | Admit: 2021-12-01 | Discharge: 2021-12-01 | Discharge status: Against Medical Advice | Payer: 59

## 2021-12-01 NOTE — ED Notes (Signed)
Called x2 for triage, no response.  

## 2022-03-05 ENCOUNTER — Encounter (HOSPITAL_COMMUNITY): Payer: Self-pay

## 2022-03-05 ENCOUNTER — Emergency Department (HOSPITAL_COMMUNITY)
Admission: EM | Admit: 2022-03-05 | Discharge: 2022-03-05 | Disposition: A | Discharge status: Home / Self Care | Payer: 59 | Attending: Emergency Medicine | Admitting: Emergency Medicine

## 2022-03-05 DIAGNOSIS — R079 Chest pain, unspecified: Secondary | ICD-10-CM | POA: Insufficient documentation

## 2022-03-05 DIAGNOSIS — Z7982 Long term (current) use of aspirin: Secondary | ICD-10-CM | POA: Insufficient documentation

## 2022-03-05 DIAGNOSIS — R002 Palpitations: Secondary | ICD-10-CM | POA: Diagnosis not present

## 2022-03-05 NOTE — ED Triage Notes (Signed)
Pt presents with c/o chest pain. Pt reports that the pain started several days ago, does have a cardiologist as she reports history of a stroke last year.  ?

## 2022-03-05 NOTE — ED Provider Notes (Signed)
?[facility]-EMERGENCY DEPT ?Provider Note ? ? ?CSN: [id number] ?Arrival date & time: 03/05/22  6948 ? ?  ? ?History ? ?Chief Complaint  ?Patient presents with  ? Chest Pain  ? ? ?Laurie Fields is a 36 y.o. female. ? ?HPI ?She is here for evaluation of chest pain and palpitations present for 1 week.  She is currently seeing a neurologist regarding a functional disorder, and sleep apnea.  She is due to get some blood testing done which has been ordered by her neurologist.  She plans on going to [facility] later today to get that done.  She does not have any associated syncope, near syncope, shortness of breath, focal weakness or paresthesia.  She is employed as a Armed forces technical officer. ?  ? ?Home Medications ?Prior to Admission medications   ?Medication Sig Start Date End Date Taking? Authorizing Provider  ?albuterol (PROVENTIL) (2.5 MG/3ML) 0.083% nebulizer solution Take 3 mLs (2.5 mg total) by nebulization every 6 (six) hours as needed for wheezing or shortness of breath. 12/07/20   Wallis Bamberg, PA-C  ?albuterol (VENTOLIN HFA) 108 (90 Base) MCG/ACT inhaler Inhale 2 puffs into the lungs every 4 (four) hours as needed for wheezing or shortness of breath. 12/07/20   Wallis Bamberg, PA-C  ?amLODipine (NORVASC) 2.5 MG tablet Take 2.5 mg by mouth daily. ?Patient not taking: No sig reported 01/22/21   [provider]  ?aspirin EC 81 MG EC tablet Take 1 tablet (81 mg total) by mouth daily. Swallow whole. 03/28/21   Bailey-Modzik, Delila A, NP  ?busPIRone (BUSPAR) 7.5 MG tablet Take 7.5 mg by mouth 2 (two) times daily as needed for anxiety. 05/31/21   [provider]  ?cholecalciferol (VITAMIN D) 25 MCG tablet Take 1 tablet (1,000 Units total) by mouth daily. 03/28/21   Bailey-Modzik, Delila A, NP  ?escitalopram (LEXAPRO) 20 MG tablet Take 20 mg by mouth daily. 05/31/21   [provider]  ?folic acid (FOLVITE) 1 MG tablet Take 1 mg by mouth 2 (two) times daily. 06/08/21   [provider]  ?ibuprofen (ADVIL) 600 MG tablet Take 1 tablet (600 mg total) by mouth every 6 (six) hours as needed. 05/10/21   Derwood Kaplan, MD  ?LORazepam (ATIVAN) 0.5 MG tablet Take 0.5-1 mg by mouth 3 (three) times daily as needed for anxiety. 05/31/21   [provider]  ?senna-docusate (SENOKOT-S) 8.6-50 MG tablet Take 1 tablet by mouth at bedtime as needed for moderate constipation. 03/27/21   Bailey-Modzik, Delila A, NP  ?sertraline (ZOLOFT) 50 MG tablet Take 50 mg by mouth daily. 08/26/21   [provider]  ?topiramate (TOPAMAX) 200 MG tablet Take 200 mg by mouth daily. 06/08/21   [provider]  ?topiramate ER (QUDEXY XR) 200 MG CS24 sprinkle capsule Take 1 capsule (200 mg total) by mouth at bedtime. ?Patient not taking: No sig reported 03/27/21   Janey Genta, NP  ?Topiramate ER (TROKENDI XR) 200 MG CP24 Take 200 mg by mouth daily.    [provider]  ?traMADol (ULTRAM) 50 MG tablet Take 1 tablet (50 mg total) by mouth every 6 (six) hours as needed for moderate pain. 03/27/21   Bailey-Modzik, Delila A, NP  ?triamcinolone cream (KENALOG) 0.1 % Apply 1 application topically 2 (two) times daily. 01/23/21   [provider]  ?vitamin B-12 1000 MCG tablet Take 1 tablet (1,000 mcg total) by mouth daily. 03/28/21   Bailey-Modzik, Ward Chatters, NP  ?   ? ?Allergies    ?  Serevent [salmeterol]   ? ?Review of Systems   ?Review of Systems ? ?Physical Exam ?Updated Vital Signs ?BP (!) 147/99 (BP Location: Right Arm)   Pulse 85   Temp 98.3 ?F (36.8 ?C) (Oral)   Resp 20   Ht  (1.626 m)   Wt 127 kg   SpO2 97%   BMI 48.06 kg/m?  ?Physical Exam ?Vitals and nursing note reviewed.  ?Constitutional:   ?   General: She is not in acute distress. ?   Appearance: She is well-developed. She is obese. She is not ill-appearing, toxic-appearing or diaphoretic.  ?HENT:  ?   Head: Normocephalic and atraumatic.  ?   Right Ear: External ear normal.  ?   Left Ear: External ear normal.  ?Eyes:  ?    Conjunctiva/sclera: Conjunctivae normal.  ?   Pupils: Pupils are equal, round, and reactive to light.  ?Neck:  ?   Trachea: Phonation normal.  ?Cardiovascular:  ?   Rate and Rhythm: Normal rate and regular rhythm.  ?   Heart sounds: Normal heart sounds.  ?Pulmonary:  ?   Effort: Pulmonary effort is normal. No respiratory distress.  ?   Breath sounds: Normal breath sounds. No stridor.  ?Abdominal:  ?   General: There is no distension.  ?Musculoskeletal:     ?   General: Normal range of motion.  ?   Cervical back: Normal range of motion and neck supple.  ?Skin: ?   General: Skin is warm and dry.  ?Neurological:  ?   Mental Status: She is alert and oriented to person, place, and time.  ?   Cranial Nerves: No cranial nerve deficit.  ?   Sensory: No sensory deficit.  ?   Motor: No abnormal muscle tone.  ?   Coordination: Coordination normal.  ?Psychiatric:     ?   Mood and Affect: Mood normal.     ?   Behavior: Behavior normal.     ?   Thought Content: Thought content normal.     ?   Judgment: Judgment normal.  ? ? ?ED Results / Procedures / Treatments   ?Labs ?(all labs ordered are listed, but only abnormal results are displayed) ?Labs Reviewed - No data to display ? ?EKG ?None ? ?Radiology ?No results found. ? ?Procedures ?Procedures  ? ? ?Medications Ordered in ED ?Medications - No data to display ? ?ED Course/ Medical Decision Making/ A&P ?  ?                        ?Medical Decision Making ?Patient with subacute symptoms of both chest pain and palpitations.  No worrisome red flags by history.  She is actively seeing a neurologist. ? ?Problems Addressed: ?Chest pain, unspecified type: ?   Details: Subacute symptoms, no red flags. ?Palpitation: ?   Details: Ongoing symptoms for 1 week ? ?Amount and/or Complexity of Data Reviewed ?Independent Historian:  ?   Details: She is a cogent historian ? ?Risk ?Decision regarding hospitalization. ?Risk Details: Patient presenting for nonspecific symptoms without worrisome red  flags.  EKG today is normal.  It looks identical to her last 2 EKGs which are in the EMR.  Doubt ACS, malignant arrhythmias or electrolyte abnormalities.  Patient is stable for discharge with short-term follow-up as planned by her neurologist including pending blood work.  She is referred to a new cardiologist for further evaluation and treatment.  There is no indication for hospitalization or more  ED evaluation at this time. ? ? ? ? ? ? ? ? ? ? ?Final Clinical Impression(s) / ED Diagnoses ?Final diagnoses:  ?Palpitation  ?Chest pain, unspecified type  ? ? ?Rx / DC Orders ?ED Discharge Orders   ? ? None  ? ?  ? ? ?  ?Mancel Bale, MD ?03/05/22 1029 ? ?

## 2022-03-05 NOTE — Discharge Instructions (Signed)
Your EKG today did not show any problems.  We are referring you to a new cardiologist to see for further care and treatment of your ongoing symptoms.  Continue to follow-up with your neurologist to include getting the blood work which they have ordered.  Return here, as needed for problems. ?

## 2023-05-28 ENCOUNTER — Encounter (HOSPITAL_COMMUNITY): Payer: Self-pay | Admitting: Emergency Medicine

## 2023-05-28 ENCOUNTER — Other Ambulatory Visit: Payer: Self-pay

## 2023-05-28 ENCOUNTER — Emergency Department (HOSPITAL_COMMUNITY): Payer: Self-pay

## 2023-05-28 ENCOUNTER — Emergency Department (HOSPITAL_COMMUNITY)
Admission: EM | Admit: 2023-05-28 | Discharge: 2023-05-28 | Disposition: A | Discharge status: Home / Self Care | Payer: Self-pay | Attending: Emergency Medicine | Admitting: Emergency Medicine

## 2023-05-28 DIAGNOSIS — K5900 Constipation, unspecified: Secondary | ICD-10-CM | POA: Insufficient documentation

## 2023-05-28 DIAGNOSIS — W19XXXA Unspecified fall, initial encounter: Secondary | ICD-10-CM | POA: Insufficient documentation

## 2023-05-28 DIAGNOSIS — M5441 Lumbago with sciatica, right side: Secondary | ICD-10-CM | POA: Insufficient documentation

## 2023-05-28 DIAGNOSIS — Z7982 Long term (current) use of aspirin: Secondary | ICD-10-CM | POA: Insufficient documentation

## 2023-05-28 MED ORDER — DEXAMETHASONE SODIUM PHOSPHATE 10 MG/ML IJ SOLN
10.0000 mg | Freq: Once | INTRAMUSCULAR | Status: AC
  Administered 2023-05-28: 10 mg via INTRAMUSCULAR
  Filled 2023-05-28: qty 1

## 2023-05-28 MED ORDER — TRAMADOL HCL 50 MG PO TABS: ORAL_TABLET | ORAL | 0 refills | Status: AC

## 2023-05-28 MED ORDER — ONDANSETRON 4 MG PO TBDP: 4.0000 mg | ORAL_TABLET | ORAL | 0 refills | Status: AC | PRN

## 2023-05-28 MED ORDER — HYDROMORPHONE HCL 1 MG/ML IJ SOLN
1.0000 mg | Freq: Once | INTRAMUSCULAR | Status: AC
  Administered 2023-05-28: 1 mg via INTRAMUSCULAR
  Filled 2023-05-28: qty 1

## 2023-05-28 NOTE — Discharge Instructions (Addendum)
1.  Your MRI did not show any severe compression.  You should follow-up with your doctor. 2.  You have been given a dose of steroids called Decadron in the emergency department.  This will continue to help over the next several days.  You have been given a prescription for tramadol.  You may take 1-2 tramadol tablets with a dose of extra strength Tylenol every 6 hours for additional pain control if needed.  You may apply over-the-counter pain patches to your lower back. 3.  Return if you have sudden worsening new or concerning symptoms.

## 2023-05-28 NOTE — ED Triage Notes (Signed)
Pt reports falling a little over a month ago and pain continues to her back. Also unsure of her last BM. Pain to right lower back and radiates down right leg. Pt using walker in triage, states she had a stroke in 2022.

## 2023-05-28 NOTE — ED Provider Notes (Signed)
[place] EMERGENCY DEPARTMENT AT [facility] Provider Note   CSN: [id number] Arrival date & time: 05/28/23  4098     History  Chief Complaint  Patient presents with   Back Pain   Fall    Laurie Fields is a 37 y.o. female.  HPI Reports she fell a little over a month ago.  She was having trouble with lower back pain on the right.  It has however progressed.  She has been seeing her doctor and given muscle relaxers.  They had discussed doing an MRI if symptoms are worsening.  Patient reports that over the past several days now pain has substantially increased and she has intense sharp pain in her lower back, burning in the buttock and down the leg with numbness and tingling in the foot.  She reports the pain is really severe and now she is started to use a rolling walker.  She denies pain burning urgency or difficulty with urination.  She reports she does feel constipated.  She reports it is too uncomfortable to be in a seated position on the toilet trying to have a bowel movement.    Home Medications Prior to Admission medications   Medication Sig Start Date End Date Taking? Authorizing Provider  ondansetron (ZOFRAN-ODT) 4 MG disintegrating tablet Take 1 tablet (4 mg total) by mouth every 4 (four) hours as needed for nausea or vomiting. 05/28/23  Yes Arby Barrette, MD  traMADol (ULTRAM) 50 MG tablet 1-2 tablets every 6 hours as needed 05/28/23  Yes Yuval Rubens, Lebron Conners, MD  albuterol (PROVENTIL) (2.5 MG/3ML) 0.083% nebulizer solution Take 3 mLs (2.5 mg total) by nebulization every 6 (six) hours as needed for wheezing or shortness of breath. 12/07/20   Wallis Bamberg, PA-C  albuterol (VENTOLIN HFA) 108 (90 Base) MCG/ACT inhaler Inhale 2 puffs into the lungs every 4 (four) hours as needed for wheezing or shortness of breath. 12/07/20   Wallis Bamberg, PA-C  amLODipine (NORVASC) 2.5 MG tablet Take 2.5 mg by mouth daily. Patient not taking: No sig reported 01/22/21   [provider]  aspirin EC 81 MG EC tablet Take 1 tablet (81 mg total) by mouth daily. Swallow whole. 03/28/21   Bailey-Modzik, Delila A, NP  busPIRone (BUSPAR) 7.5 MG tablet Take 7.5 mg by mouth 2 (two) times daily as needed for anxiety. 05/31/21   [provider]  cholecalciferol (VITAMIN D) 25 MCG tablet Take 1 tablet (1,000 Units total) by mouth daily. 03/28/21   Bailey-Modzik, Delila A, NP  escitalopram (LEXAPRO) 20 MG tablet Take 20 mg by mouth daily. 05/31/21   [provider]  folic acid (FOLVITE) 1 MG tablet Take 1 mg by mouth 2 (two) times daily. 06/08/21   [provider]  ibuprofen (ADVIL) 600 MG tablet Take 1 tablet (600 mg total) by mouth every 6 (six) hours as needed. 05/10/21   Derwood Kaplan, MD  LORazepam (ATIVAN) 0.5 MG tablet Take 0.5-1 mg by mouth 3 (three) times daily as needed for anxiety. 05/31/21   [provider]  senna-docusate (SENOKOT-S) 8.6-50 MG tablet Take 1 tablet by mouth at bedtime as needed for moderate constipation. 03/27/21   Bailey-Modzik, Delila A, NP  sertraline (ZOLOFT) 50 MG tablet Take 50 mg by mouth daily. 08/26/21   [provider]  topiramate (TOPAMAX) 200 MG tablet Take 200 mg by mouth daily. 06/08/21   [provider]  topiramate ER (QUDEXY XR) 200 MG CS24 sprinkle capsule Take 1 capsule (200 mg total) by  mouth at bedtime. Patient not taking: No sig reported 03/27/21   Bailey-Modzik, Delila A, NP  Topiramate ER (TROKENDI XR) 200 MG CP24 Take 200 mg by mouth daily.    [provider]  traMADol (ULTRAM) 50 MG tablet Take 1 tablet (50 mg total) by mouth every 6 (six) hours as needed for moderate pain. 03/27/21   Bailey-Modzik, Delila A, NP  triamcinolone cream (KENALOG) 0.1 % Apply 1 application topically 2 (two) times daily. 01/23/21   [provider]  vitamin B-12 1000 MCG tablet Take 1 tablet (1,000 mcg total) by mouth daily. 03/28/21   Bailey-Modzik, Jillene Bucks A, NP      Allergies    Serevent [salmeterol]     Review of Systems   Review of Systems  Physical Exam Updated Vital Signs BP (!) 150/115   Pulse (!) 57   Temp 97.6 F (36.4 C)   Resp 16   Ht 5\' 4"  (1.626 m)   Wt 127 kg   SpO2 96%   BMI 48.06 kg/m  Physical Exam Constitutional:      Comments: Alert nontoxic.  Mental status clear.  No respiratory distress.  HENT:     Mouth/Throat:     Pharynx: Oropharynx is clear.  Eyes:     Extraocular Movements: Extraocular movements intact.  Cardiovascular:     Rate and Rhythm: Normal rate and regular rhythm.  Pulmonary:     Effort: Pulmonary effort is normal.     Breath sounds: Normal breath sounds.  Abdominal:     General: There is no distension.     Palpations: Abdomen is soft.     Tenderness: There is no abdominal tenderness. There is no guarding.  Musculoskeletal:     Comments: No peripheral edema.  Calves are soft and nontender.  Patient does endorse pain with range of motion of the right lower extremity.  Extremities warm and dry.  Also endorses pain with palpation of the lumbar spine, right buttock and posterior right leg.  Skin:    General: Skin is warm and dry.  Neurological:     Comments: Patient to make effort at dorsiflexion and plantar flexion.  However, patient reports a lot of pain.  Effort is somewhat limited by pain.     ED Results / Procedures / Treatments   Labs (all labs ordered are listed, but only abnormal results are displayed) Labs Reviewed  PREGNANCY, URINE  URINALYSIS, ROUTINE W REFLEX MICROSCOPIC    EKG None  Radiology MR Lumbar Spine Wo Contrast  Result Date: 05/28/2023 CLINICAL DATA:  Low back pain EXAM: MRI LUMBAR SPINE WITHOUT CONTRAST TECHNIQUE: Multiplanar, multisequence MR imaging of the lumbar spine was performed. No intravenous contrast was administered. COMPARISON:  CT examination dated September 25, 2016 FINDINGS: Segmentation:   5 non rib-bearing lumbar type vertebral bodies are present. The lowest fully formed vertebral body is L5.  Alignment:  Physiologic. Vertebrae:  No fracture, evidence of discitis, or bone lesion. Conus medullaris and cauda equina: Conus extends to the L1 level. Conus and cauda equina appear normal. Paraspinal and other soft tissues: Negative. Disc levels: T12-L1: No significant disc bulge. No neural foraminal stenosis. No central canal stenosis. L1-L2: No significant disc bulge. No neural foraminal stenosis. No central canal stenosis. L2-L3: No significant disc bulge. No neural foraminal stenosis. No central canal stenosis. L3-L4: No significant disc bulge. No neural foraminal stenosis. No central canal stenosis. L4-L5: No significant disc bulge. No neural foraminal stenosis. No central canal stenosis. L5-S1: Disc desiccation and broad-based  disc bulge with mild bilateral lateral recess stenosis. No significant neural foraminal stenosis. IMPRESSION: 1. No evidence of fracture or dislocation. 2. 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 Electronically Signed   By: Larose Hires D.O.   On: 05/28/2023 14:27    Procedures Procedures    Medications Ordered in ED Medications  HYDROmorphone (DILAUDID) injection 1 mg (1 mg Intramuscular Given 05/28/23 1154)  dexamethasone (DECADRON) injection 10 mg (10 mg Intramuscular Given 05/28/23 1154)    ED Course/ Medical Decision Making/ A&P                             Medical Decision Making Amount and/or Complexity of Data Reviewed Labs: ordered. Radiology: ordered.  Risk Prescription drug management.   Reports a fall somewhat over a month ago and now severe progressive lumbar back pain with radiculopathy progressed to the point of paresthesia and walking with a rolling walker.  Patient does have strength intact but has pain or effort related deficit on the right.  Will proceed with pain control with Dilaudid and Decadron.  Will order MRI lumbar spine.  MRI returns with; 1. No evidence of fracture or  dislocation. 2. 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  At this point with disc bulge but no critical impingement, patient appropriate for continued outpatient pain management and follow-up.  Patient has been given a dose of Decadron which should be helpful over the next several days.  Will prescribe for combination of tramadol 1 to 2 tablets every 6 hours with a dose of extra strength Tylenol.  Patient is advised to use over-the-counter pain patches and continue to work with her PCP for pain control referrals as needed.        Final Clinical Impression(s) / ED Diagnoses Final diagnoses:  Right-sided low back pain with right-sided sciatica, unspecified chronicity    Rx / DC Orders ED Discharge Orders          Ordered    traMADol (ULTRAM) 50 MG tablet        05/28/23 1520    ondansetron (ZOFRAN-ODT) 4 MG disintegrating tablet  Every 4 hours PRN        05/28/23 1523              Arby Barrette, MD 05/28/23 1525
